# Patient Record
Sex: Female | Born: 1960 | Race: White | Hispanic: No | Smoking: Never smoker
Health system: Southern US, Community
[De-identification: ages and names within clinical notes are randomized; demographics above are authoritative.]

## PROBLEM LIST (undated history)

## (undated) DIAGNOSIS — G459 Transient cerebral ischemic attack, unspecified: Secondary | ICD-10-CM

## (undated) DIAGNOSIS — Z8619 Personal history of other infectious and parasitic diseases: Secondary | ICD-10-CM

## (undated) DIAGNOSIS — N816 Rectocele: Secondary | ICD-10-CM

## (undated) DIAGNOSIS — M85852 Other specified disorders of bone density and structure, left thigh: Secondary | ICD-10-CM

## (undated) DIAGNOSIS — Z9884 Bariatric surgery status: Secondary | ICD-10-CM

## (undated) DIAGNOSIS — G43109 Migraine with aura, not intractable, without status migrainosus: Secondary | ICD-10-CM

## (undated) DIAGNOSIS — K909 Intestinal malabsorption, unspecified: Secondary | ICD-10-CM

## (undated) DIAGNOSIS — J45909 Unspecified asthma, uncomplicated: Secondary | ICD-10-CM

## (undated) DIAGNOSIS — K219 Gastro-esophageal reflux disease without esophagitis: Secondary | ICD-10-CM

## (undated) DIAGNOSIS — M8588 Other specified disorders of bone density and structure, other site: Secondary | ICD-10-CM

## (undated) DIAGNOSIS — N3946 Mixed incontinence: Secondary | ICD-10-CM

## (undated) DIAGNOSIS — G8929 Other chronic pain: Secondary | ICD-10-CM

## (undated) DIAGNOSIS — F32A Depression, unspecified: Secondary | ICD-10-CM

## (undated) DIAGNOSIS — G43909 Migraine, unspecified, not intractable, without status migrainosus: Secondary | ICD-10-CM

## (undated) DIAGNOSIS — R768 Other specified abnormal immunological findings in serum: Secondary | ICD-10-CM

## (undated) DIAGNOSIS — J449 Chronic obstructive pulmonary disease, unspecified: Secondary | ICD-10-CM

## (undated) DIAGNOSIS — E538 Deficiency of other specified B group vitamins: Secondary | ICD-10-CM

## (undated) HISTORY — PX: ABDOMINAL HYSTERECTOMY: SHX81

## (undated) HISTORY — PX: REPAIR RECTOCELE: SUR1206

## (undated) HISTORY — PX: INCONTINENCE SURGERY: SHX676

## (undated) HISTORY — PX: DIAGNOSTIC LAPAROSCOPY: SUR761

## (undated) HISTORY — PX: GASTRIC BYPASS: SHX52

## (undated) HISTORY — PX: CHOLECYSTECTOMY: SHX55

---

## 2011-02-12 ENCOUNTER — Ambulatory Visit: Payer: Self-pay | Admitting: Family Medicine

## 2012-10-05 ENCOUNTER — Encounter: Payer: Self-pay | Admitting: Family Medicine

## 2012-10-23 ENCOUNTER — Encounter: Payer: Self-pay | Admitting: Family Medicine

## 2013-02-04 ENCOUNTER — Ambulatory Visit: Payer: Self-pay | Admitting: Family Medicine

## 2013-03-05 ENCOUNTER — Ambulatory Visit: Payer: Self-pay | Admitting: Family Medicine

## 2013-09-28 ENCOUNTER — Ambulatory Visit: Payer: Self-pay

## 2013-12-02 ENCOUNTER — Emergency Department: Payer: Self-pay | Admitting: Emergency Medicine

## 2014-10-06 ENCOUNTER — Emergency Department
Admission: EM | Admit: 2014-10-06 | Discharge: 2014-10-06 | Disposition: A | Discharge status: Home / Self Care | Payer: Medicare Other | Attending: Emergency Medicine | Admitting: Emergency Medicine

## 2014-10-06 ENCOUNTER — Emergency Department: Payer: Medicare Other

## 2014-10-06 ENCOUNTER — Other Ambulatory Visit: Payer: Self-pay

## 2014-10-06 ENCOUNTER — Encounter: Payer: Self-pay | Admitting: Emergency Medicine

## 2014-10-06 DIAGNOSIS — M79642 Pain in left hand: Secondary | ICD-10-CM | POA: Insufficient documentation

## 2014-10-06 DIAGNOSIS — R42 Dizziness and giddiness: Secondary | ICD-10-CM | POA: Diagnosis not present

## 2014-10-06 HISTORY — DX: Migraine, unspecified, not intractable, without status migrainosus: G43.909

## 2014-10-06 HISTORY — DX: Unspecified asthma, uncomplicated: J45.909

## 2014-10-06 LAB — CBC
HEMATOCRIT: 43.6 % (ref 35.0–47.0)
Hemoglobin: 14.4 g/dL (ref 12.0–16.0)
MCH: 30.2 pg (ref 26.0–34.0)
MCHC: 33 g/dL (ref 32.0–36.0)
MCV: 91.4 fL (ref 80.0–100.0)
Platelets: 220 10*3/uL (ref 150–440)
RBC: 4.77 MIL/uL (ref 3.80–5.20)
RDW: 12.7 % (ref 11.5–14.5)
WBC: 8.8 10*3/uL (ref 3.6–11.0)

## 2014-10-06 LAB — COMPREHENSIVE METABOLIC PANEL
ALBUMIN: 4.4 g/dL (ref 3.5–5.0)
ALK PHOS: 94 U/L (ref 38–126)
ALT: 14 U/L (ref 14–54)
AST: 23 U/L (ref 15–41)
Anion gap: 6 (ref 5–15)
BUN: 14 mg/dL (ref 6–20)
CALCIUM: 9.5 mg/dL (ref 8.9–10.3)
CO2: 29 mmol/L (ref 22–32)
Chloride: 104 mmol/L (ref 101–111)
Creatinine, Ser: 0.76 mg/dL (ref 0.44–1.00)
GFR calc non Af Amer: >60 mL/min (ref >60)
Glucose, Bld: 74 mg/dL (ref 65–99)
Potassium: 3.8 mmol/L (ref 3.5–5.1)
Sodium: 139 mmol/L (ref 135–145)
Total Bilirubin: 0.4 mg/dL (ref 0.3–1.2)
Total Protein: 7.2 g/dL (ref 6.5–8.1)

## 2014-10-06 LAB — TROPONIN I: Troponin I: <0.03 ng/mL (ref <0.031)

## 2014-10-06 MED ORDER — SODIUM CHLORIDE 0.9 % IV SOLN
1000.0000 mL | Freq: Once | INTRAVENOUS | Status: AC
Start: 2014-10-06 — End: 2014-10-06
  Administered 2014-10-06: 1000 mL via INTRAVENOUS

## 2014-10-06 NOTE — ED Notes (Signed)
Pt discharged home after verbalizing understanding of discharge instructions; nad noted. 

## 2014-10-06 NOTE — ED Provider Notes (Signed)
[facility] Emergency Department Provider Note  ____________________________________________  Time seen: On arrival  I have reviewed the triage vital signs and the nursing notes.   HISTORY  Chief Complaint Dizziness and Nausea    HPI Sandra Chung is a 54 y.o. female patient reports she was in her car driving and developed dizziness and nausea and felt shaky like she might pass out. She pulled over and it felt better relatively quickly and decided to come to the emergency department to get checked out. She denies chest pain. She denies short of breath. No focal deficits, no headache. She feels well now. She also complains that she injured her left finger proximal one week ago and would like an x-ray for this.     Past Medical History  Diagnosis Date  . Asthma   . Migraines     There are no active problems to display for this patient.   Past Surgical History  Procedure Laterality Date  . Gastric bypass    . Abdominal hysterectomy      total  . Cholecystectomy      No current outpatient prescriptions on file.  Allergies Levaquin  No family history on file.  Social History History  Substance Use Topics  . Smoking status: Never Smoker   . Smokeless tobacco: Not on file  . Alcohol Use: No     Comment: rarely    Review of Systems  Constitutional: Negative for fever. Eyes: Negative for visual changes. ENT: Negative for sore throat Cardiovascular: Negative for chest pain. Respiratory: Negative for shortness of breath. Gastrointestinal: Negative for abdominal pain, vomiting and diarrhea. Genitourinary: Negative for dysuria. Musculoskeletal: Negative for back pain. Positive left hand pain Skin: Negative for rash. Neurological: Negative for headaches or focal weakness Psychiatric: Mild anxiety  10-point ROS otherwise negative.  ____________________________________________   PHYSICAL EXAM:  VITAL SIGNS: ED Triage Vitals  Enc  Vitals Group     BP 10/06/14 1245 124/100 mmHg     Pulse Rate 10/06/14 1245 77     Resp 10/06/14 1245 16     Temp 10/06/14 1245 98.4 F (36.9 C)     Temp Source 10/06/14 1245 Oral     SpO2 10/06/14 1245 98 %     Weight 10/06/14 1245 130 lb (58.968 kg)     Height 10/06/14 1245  (1.651 m)     Head Cir --      Peak Flow --      Pain Score 10/06/14 1245 4     Pain Loc --      Pain Edu? --      Excl. in GC? --      Constitutional: Alert and oriented. Well appearing and in no distress. Eyes: Conjunctivae are normal.  ENT   Head: Normocephalic and atraumatic.   Mouth/Throat: Mucous membranes are moist. Cardiovascular: Normal rate, regular rhythm. Normal and symmetric distal pulses are present in all extremities. No murmurs, rubs, or gallops. Respiratory: Normal respiratory effort without tachypnea nor retractions. Breath sounds are clear and equal bilaterally.  Gastrointestinal: Soft and non-tender in all quadrants. No distention. There is no CVA tenderness. Genitourinary: deferred Musculoskeletal: Patient with mild bruises to bilateral anterior mid tibias, no bony abnormalities. 2+ distal pulses. Patient with left fifth finger mild tenderness to palpation at IP joint. Full range of motion Neurologic:  Normal speech and language. No gross focal neurologic deficits are appreciated. Skin:  Skin is warm, dry and intact. No rash noted. Psychiatric: Mood and  affect are normal. Patient exhibits appropriate insight and judgment.  ____________________________________________    LABS (pertinent positives/negatives)  Labs Reviewed  COMPREHENSIVE METABOLIC PANEL  CBC  TROPONIN I  URINALYSIS COMPLETEWITH MICROSCOPIC ([facility] ONLY)    ____________________________________________   EKG  ED ECG REPORT I, Jene EveryKINNER, Leslyn Monda, the attending physician, personally viewed and interpreted this ECG.   Date: 10/06/2014  EKG Time: 12:45 PM  Rate: 82  Rhythm: normal sinus rhythm  Axis:  Normal  Intervals:none  ST&T Change: Nonspecific   ____________________________________________    RADIOLOGY I have personally reviewed any xrays that were ordered on this patient: Hand x-ray normal  ____________________________________________   PROCEDURES  Procedure(s) performed: none  Critical Care performed: none  ____________________________________________   INITIAL IMPRESSION / ASSESSMENT AND PLAN / ED COURSE  Pertinent labs & imaging results that were available during my care of the patient were reviewed by me and considered in my medical decision making (see chart for details).  Patient well-appearing, no acute distress. No chest pain, EKG unremarkable. We will check labs and give 1 L normal saline and reevaluate.   ----------------------------------------- 3:18 PM on 10/06/2014 -----------------------------------------  Patient well-appearing. Labs unremarkable. EKG normal. Feels well. All vitals stable. Okay for discharge with follow-up with PCP ____________________________________________   FINAL CLINICAL IMPRESSION(S) / ED DIAGNOSES  Final diagnoses:  Dizziness     Jene Everyobert Tenleigh Byer, MD 10/06/14 [phone]

## 2014-10-06 NOTE — ED Notes (Signed)
Pt comes into the ED c/o dizziness, nausea, shaking, and diaphoresis.  Explains that she was driving to work and had to pull over due to the symptoms.  Denies shortness of breath and chest pain.  Patient states she has had mild visual changes when it comes to "focusing in on things".

## 2014-10-06 NOTE — Discharge Instructions (Signed)

## 2015-07-27 ENCOUNTER — Other Ambulatory Visit: Payer: Self-pay | Admitting: Family Medicine

## 2015-07-27 ENCOUNTER — Ambulatory Visit
Admission: RE | Admit: 2015-07-27 | Discharge: 2015-07-27 | Disposition: A | Discharge status: Home / Self Care | Payer: Self-pay | Source: Ambulatory Visit | Attending: Family Medicine | Admitting: Family Medicine

## 2015-07-27 DIAGNOSIS — M25532 Pain in left wrist: Secondary | ICD-10-CM | POA: Insufficient documentation

## 2015-11-16 ENCOUNTER — Other Ambulatory Visit: Payer: Self-pay | Admitting: Family Medicine

## 2015-11-16 DIAGNOSIS — Z1231 Encounter for screening mammogram for malignant neoplasm of breast: Secondary | ICD-10-CM

## 2015-12-06 ENCOUNTER — Ambulatory Visit: Payer: Medicare Other | Attending: Family Medicine

## 2016-09-11 ENCOUNTER — Ambulatory Visit
Admission: RE | Admit: 2016-09-11 | Discharge: 2016-09-11 | Disposition: A | Discharge status: Home / Self Care | Payer: Medicare Other | Source: Ambulatory Visit | Attending: Family Medicine | Admitting: Family Medicine

## 2016-09-11 ENCOUNTER — Other Ambulatory Visit: Payer: Self-pay | Admitting: Family Medicine

## 2016-09-11 DIAGNOSIS — M7989 Other specified soft tissue disorders: Secondary | ICD-10-CM

## 2017-01-13 ENCOUNTER — Other Ambulatory Visit: Payer: Self-pay | Admitting: Family Medicine

## 2017-01-13 DIAGNOSIS — Z1231 Encounter for screening mammogram for malignant neoplasm of breast: Secondary | ICD-10-CM

## 2017-01-28 ENCOUNTER — Ambulatory Visit
Admission: RE | Admit: 2017-01-28 | Discharge: 2017-01-28 | Disposition: A | Discharge status: Home / Self Care | Payer: Medicare Other | Source: Ambulatory Visit | Attending: Family Medicine | Admitting: Family Medicine

## 2017-01-28 DIAGNOSIS — Z1231 Encounter for screening mammogram for malignant neoplasm of breast: Secondary | ICD-10-CM | POA: Diagnosis not present

## 2019-01-15 ENCOUNTER — Other Ambulatory Visit: Payer: Self-pay | Admitting: Acute Care

## 2019-01-15 DIAGNOSIS — I639 Cerebral infarction, unspecified: Secondary | ICD-10-CM

## 2019-01-28 ENCOUNTER — Ambulatory Visit: Payer: Medicare Other

## 2019-06-21 ENCOUNTER — Other Ambulatory Visit: Payer: Self-pay

## 2019-06-21 ENCOUNTER — Ambulatory Visit: Payer: Medicare Other | Attending: Internal Medicine

## 2019-06-21 DIAGNOSIS — Z23 Encounter for immunization: Secondary | ICD-10-CM

## 2019-06-21 NOTE — Progress Notes (Signed)
   Covid-19 Vaccination Clinic  Name:  Sandra Chung    MRN: [id number] DOB: 12/02/1960  06/21/2019  Sandra Chung was observed post Covid-19 immunization for 15 minutes without incident. She was provided with Vaccine Information Sheet and instruction to access the V-Safe system.   Sandra Chung was instructed to call 911 with any severe reactions post vaccine: Marland Kitchen Difficulty breathing  . Swelling of face and throat  . A fast heartbeat  . A bad rash all over body  . Dizziness and weakness   Immunizations Administered    Name Date Dose VIS Date Route   Pfizer COVID-19 Vaccine 06/21/2019  2:16 PM 0.3 mL 03/05/2019 Intramuscular   Manufacturer: [employer], [employer]   Lot: [id number]   NDC: [address]-3

## 2019-07-14 ENCOUNTER — Ambulatory Visit: Payer: Medicare Other | Attending: Internal Medicine

## 2019-07-14 DIAGNOSIS — Z23 Encounter for immunization: Secondary | ICD-10-CM

## 2019-07-14 NOTE — Progress Notes (Signed)
   Covid-19 Vaccination Clinic  Name:  Sandra Chung    MRN: [id number] DOB: 07/31/1960  07/14/2019  [name] was observed post Covid-19 immunization for 30 minutes based on pre-vaccination screening without incident. She was provided with Vaccine Information Sheet and instruction to access the V-Safe system.   [name] was instructed to call 911 with any severe reactions post vaccine: Marland Kitchen Difficulty breathing  . Swelling of face and throat  . A fast heartbeat  . A bad rash all over body  . Dizziness and weakness   Immunizations Administered    Name Date Dose VIS Date Route   Pfizer COVID-19 Vaccine 07/14/2019  9:45 AM 0.3 mL 05/19/2018 Intramuscular   Manufacturer: [employer], [employer]   Lot: [id number]   NDC: [address]-1

## 2019-10-07 ENCOUNTER — Emergency Department
Admission: EM | Admit: 2019-10-07 | Discharge: 2019-10-07 | Disposition: A | Discharge status: Home / Self Care | Payer: Medicare Other | Attending: Emergency Medicine | Admitting: Emergency Medicine

## 2019-10-07 ENCOUNTER — Emergency Department: Payer: Medicare Other

## 2019-10-07 ENCOUNTER — Other Ambulatory Visit: Payer: Self-pay

## 2019-10-07 DIAGNOSIS — R519 Headache, unspecified: Secondary | ICD-10-CM | POA: Diagnosis present

## 2019-10-07 DIAGNOSIS — G43009 Migraine without aura, not intractable, without status migrainosus: Secondary | ICD-10-CM | POA: Diagnosis not present

## 2019-10-07 DIAGNOSIS — J45909 Unspecified asthma, uncomplicated: Secondary | ICD-10-CM | POA: Insufficient documentation

## 2019-10-07 NOTE — ED Provider Notes (Signed)
[facility] Emergency Department Provider Note ____________________________________________  Time seen: Approximately 11:03 AM  I have reviewed the triage vital signs and the nursing notes.   HISTORY  Chief Complaint Headache   HPI Sandra Chung is a 59 y.o. female with a history of TIA and migraine who presents to the emergency department for evaluation of headache. Around 2pm yesterday, she experienced a sudden onset of headache on the right parietal area. She had intense pain as if she had been struck with a hammer. This pain lasted about 5 minutes and then settled into a dull headache with occasional sharp pains which has continued. No relief with tylenol.    Past Medical History:  Diagnosis Date  . Asthma   . Migraines     There are no problems to display for this patient.   Past Surgical History:  Procedure Laterality Date  . ABDOMINAL HYSTERECTOMY     total  . CHOLECYSTECTOMY    . GASTRIC BYPASS      Prior to Admission medications   Not on File    Allergies Levaquin [levofloxacin in d5w]  Family History  Problem Relation Age of Onset  . Breast cancer Maternal Aunt 27    Social History Social History   Tobacco Use  . Smoking status: Never Smoker  . Smokeless tobacco: Never Used  Substance Use Topics  . Alcohol use: No    Comment: rarely  . Drug use: No    Review of Systems Constitutional: No fever/chills or recent injury. Eyes: No visual changes. ENT: No sore throat. Respiratory: Denies shortness of breath. Gastrointestinal: No abdominal pain.  No nausea, no vomiting.  No diarrhea.  No constipation. Musculoskeletal: Negative for pain. Skin: Negative for rash. Neurological:Positive for headache, negative for focal weakness or numbness. No confusion or fainting. ___________________________________________   PHYSICAL EXAM:  VITAL SIGNS: ED Triage Vitals  Enc Vitals Group     BP 10/07/19 1008 140/84     Pulse Rate  10/07/19 1008 76     Resp 10/07/19 1008 18     Temp 10/07/19 1008 98.2 F (36.8 C)     Temp Source 10/07/19 1008 Oral     SpO2 10/07/19 1008 99 %     Weight 10/07/19 1009 155 lb (70.3 kg)     Height 10/07/19 1009 5\' 1"  (1.549 m)     Head Circumference --      Peak Flow --      Pain Score 10/07/19 1008 5     Pain Loc --      Pain Edu? --      Excl. in GC? --     Constitutional: Alert and oriented. Well appearing and in no acute distress. Eyes: Conjunctivae are normal. PERRL. EOMI without expressed pain. No evidence of papilledema on limited exam. Head: Atraumatic. Nose: No congestion/rhinnorhea. Mouth/Throat: Mucous membranes are moist.  Oropharynx non-erythematous. Neck: No stridor. Supple, no meningismus.  Cardiovascular: Normal rate, regular rhythm. Grossly normal heart sounds.  Good peripheral circulation. Respiratory: Normal respiratory effort.  No retractions. Lungs CTAB. Gastrointestinal: Soft and nontender. No distention.  Musculoskeletal: No lower extremity tenderness nor edema.  No joint effusions. Neurologic:  Normal speech and language. No gross focal neurologic deficits are appreciated. No gait instability. Cranial nerves: 2-10 normal as tested. Cerebellar:Normal Romberg, finger-nose-finger, heel to shin, normal gait. Sensorimotor: No aphasia, pronator drift, clonus, sensory loss or abnormal reflexes.  Skin:  Skin is warm, dry and intact. No rash noted. Psychiatric: Mood and affect  are normal. Speech and behavior are normal. Normal thought process and cognition.  ____________________________________________   LABS (all labs ordered are listed, but only abnormal results are displayed)  Labs Reviewed - No data to display ____________________________________________  EKG  Not indicated. ____________________________________________  RADIOLOGY  CT Head Wo Contrast  Result Date: 10/07/2019 CLINICAL DATA:  New headache, history of migraines and TIAs. EXAM: CT HEAD  WITHOUT CONTRAST TECHNIQUE: Contiguous axial images were obtained from the base of the skull through the vertex without intravenous contrast. COMPARISON:  12/02/2013. FINDINGS: Brain: No evidence of acute infarction, hemorrhage, hydrocephalus, extra-axial collection or mass lesion/mass effect. Vascular: Small punctate calcification in the distribution of the left middle cerebral artery is identified, image 6/2. This is unchanged when compared with previous exam. No hyperdense vessel or unexpected calcification identified. Skull: Normal. Negative for fracture or focal lesion. Sinuses/Orbits: Partial opacification of the right posterior mastoid air cells. Left mastoid air cells and paranasal sinuses are clear. Other: None. IMPRESSION: 1. No acute intracranial abnormalities. 2. Partial opacification of the right posterior mastoid air cells. Electronically Signed   By: Signa Kell M.D.   On: 10/07/2019 11:34   MR ANGIO HEAD WO CONTRAST  Result Date: 10/07/2019 CLINICAL DATA:  Headache EXAM: MRI HEAD WITHOUT CONTRAST MRA HEAD WITHOUT CONTRAST TECHNIQUE: Multiplanar, multiecho pulse sequences of the brain and surrounding structures were obtained without intravenous contrast. Angiographic images of the head were obtained using MRA technique without contrast. COMPARISON:  None. FINDINGS: MRI HEAD Brain: There is no acute infarction or intracranial hemorrhage. There is no intracranial mass, mass effect, or edema. There is no hydrocephalus or extra-axial fluid collection. Ventricles and sulci are normal in size and configuration. Few scattered foci of T2 hyperintensity in the supratentorial white matter are nonspecific but may reflect minor chronic microvascular ischemic changes. Tiny bilateral chronic cerebellar infarcts. Vascular: Major vessel flow voids at the skull base are preserved. Skull and upper cervical spine: Normal marrow signal is preserved. Sinuses/Orbits: Paranasal sinuses are aerated. Orbits are  unremarkable. Other: Sella is unremarkable. Mild patchy mastoid fluid opacification. MRA HEAD Intracranial internal carotid arteries are patent. Middle and anterior cerebral arteries are patent. Intracranial vertebral arteries, basilar artery, posterior cerebral arteries are patent. There is no significant stenosis or aneurysm. IMPRESSION: No evidence of recent infarction, hemorrhage, or mass. Tiny bilateral chronic cerebellar infarcts. Normal MRA of the head. Electronically Signed   By: Guadlupe Spanish M.D.   On: 10/07/2019 13:56   MR BRAIN WO CONTRAST  Result Date: 10/07/2019 CLINICAL DATA:  Headache EXAM: MRI HEAD WITHOUT CONTRAST MRA HEAD WITHOUT CONTRAST TECHNIQUE: Multiplanar, multiecho pulse sequences of the brain and surrounding structures were obtained without intravenous contrast. Angiographic images of the head were obtained using MRA technique without contrast. COMPARISON:  None. FINDINGS: MRI HEAD Brain: There is no acute infarction or intracranial hemorrhage. There is no intracranial mass, mass effect, or edema. There is no hydrocephalus or extra-axial fluid collection. Ventricles and sulci are normal in size and configuration. Few scattered foci of T2 hyperintensity in the supratentorial white matter are nonspecific but may reflect minor chronic microvascular ischemic changes. Tiny bilateral chronic cerebellar infarcts. Vascular: Major vessel flow voids at the skull base are preserved. Skull and upper cervical spine: Normal marrow signal is preserved. Sinuses/Orbits: Paranasal sinuses are aerated. Orbits are unremarkable. Other: Sella is unremarkable. Mild patchy mastoid fluid opacification. MRA HEAD Intracranial internal carotid arteries are patent. Middle and anterior cerebral arteries are patent. Intracranial vertebral arteries, basilar artery, posterior cerebral arteries are  patent. There is no significant stenosis or aneurysm. IMPRESSION: No evidence of recent infarction, hemorrhage, or mass.  Tiny bilateral chronic cerebellar infarcts. Normal MRA of the head. Electronically Signed   By: Guadlupe Spanish M.D.   On: 10/07/2019 13:56   ____________________________________________   PROCEDURES  Procedure(s) performed:  Procedures  Critical Care performed: None ____________________________________________   INITIAL IMPRESSION / ASSESSMENT AND PLAN / ED COURSE  59 year old female presenting to the emergency department for treatment and evaluation of sudden severe headache yesterday around 2 PM.  See HPI for further details.  She does have a history of TIA as well as migraines.  She has no focal weakness on exam.  Plan will be to get a CT without contrast.  Differential diagnosis includes but is not limited to: Atypical migraine, intracranial pathology, TIA/CVA  CT without contrast was negative.  Do to patient's symptoms, will proceed with MRI/MRA.  Patient agrees to this plan.  MRI/MRA are both negative for acute findings.  Symptoms most likely related to an atypical migraine.  She was strongly encouraged to schedule an appointment with her neurologist at [facility].  She is to return to the emergency department for symptoms of change or worsen or for new concerns if she is unable to schedule appointment.  Pertinent labs & imaging results that were available during my care of the patient were reviewed by me and considered in my medical decision making (see chart for details). ____________________________________________   FINAL CLINICAL IMPRESSION(S) / ED DIAGNOSES  Final diagnoses:  Atypical migraine    ED Discharge Orders    None       Chinita Pester, FNP 10/07/19 1509    Emily Filbert, MD 10/07/19 1517

## 2019-10-07 NOTE — ED Triage Notes (Signed)
Pt states she had a sudden onset top of head pain yesterday and today just having a mild HA, denies dizziness or nausea, no visual changes.

## 2019-10-07 NOTE — ED Notes (Signed)
Pt ambulatory to MRI with [facility], MRI tech.

## 2019-10-07 NOTE — ED Notes (Signed)
Pt talking to MRI on the phone.  

## 2019-11-24 ENCOUNTER — Other Ambulatory Visit: Payer: Self-pay | Admitting: Family Medicine

## 2019-11-24 DIAGNOSIS — K909 Intestinal malabsorption, unspecified: Secondary | ICD-10-CM

## 2019-11-24 DIAGNOSIS — Z780 Asymptomatic menopausal state: Secondary | ICD-10-CM

## 2019-12-20 ENCOUNTER — Other Ambulatory Visit: Payer: Self-pay | Admitting: Family Medicine

## 2019-12-20 DIAGNOSIS — Z1231 Encounter for screening mammogram for malignant neoplasm of breast: Secondary | ICD-10-CM

## 2020-01-12 ENCOUNTER — Ambulatory Visit
Admission: RE | Admit: 2020-01-12 | Discharge: 2020-01-12 | Disposition: A | Discharge status: Home / Self Care | Payer: Medicare Other | Source: Ambulatory Visit | Attending: Family Medicine | Admitting: Family Medicine

## 2020-01-12 ENCOUNTER — Other Ambulatory Visit: Payer: Self-pay

## 2020-01-12 DIAGNOSIS — K909 Intestinal malabsorption, unspecified: Secondary | ICD-10-CM

## 2020-01-12 DIAGNOSIS — Z1231 Encounter for screening mammogram for malignant neoplasm of breast: Secondary | ICD-10-CM | POA: Diagnosis present

## 2020-01-12 DIAGNOSIS — Z780 Asymptomatic menopausal state: Secondary | ICD-10-CM | POA: Insufficient documentation

## 2020-08-01 ENCOUNTER — Other Ambulatory Visit: Payer: Self-pay | Admitting: Family Medicine

## 2020-08-01 DIAGNOSIS — M79605 Pain in left leg: Secondary | ICD-10-CM

## 2020-08-01 DIAGNOSIS — S8012XA Contusion of left lower leg, initial encounter: Secondary | ICD-10-CM

## 2020-08-02 ENCOUNTER — Other Ambulatory Visit: Payer: Self-pay

## 2020-08-02 ENCOUNTER — Ambulatory Visit
Admission: RE | Admit: 2020-08-02 | Discharge: 2020-08-02 | Disposition: A | Discharge status: Home / Self Care | Payer: Medicare Other | Source: Ambulatory Visit | Attending: Family Medicine | Admitting: Family Medicine

## 2020-08-02 DIAGNOSIS — S8012XA Contusion of left lower leg, initial encounter: Secondary | ICD-10-CM | POA: Diagnosis present

## 2020-08-02 DIAGNOSIS — M79605 Pain in left leg: Secondary | ICD-10-CM | POA: Diagnosis present

## 2021-11-17 IMAGING — MR MR MRA HEAD W/O CM
3 series · 23 of 48 positions shown · non-contrast
Comparison: None.

CLINICAL DATA: Headache

EXAM:
MRI HEAD WITHOUT CONTRAST
MRA HEAD WITHOUT CONTRAST
TECHNIQUE: Multiplanar, multiecho pulse sequences of the brain and surrounding
structures were obtained without intravenous contrast. Angiographic
images of the head were obtained using MRA technique without
contrast.

[Series 13: TOF · axial · 0.5mm · 0.41mm/px · z∈[-72,+24]mm · 13 of 205 slices shown]
[im 1/205]
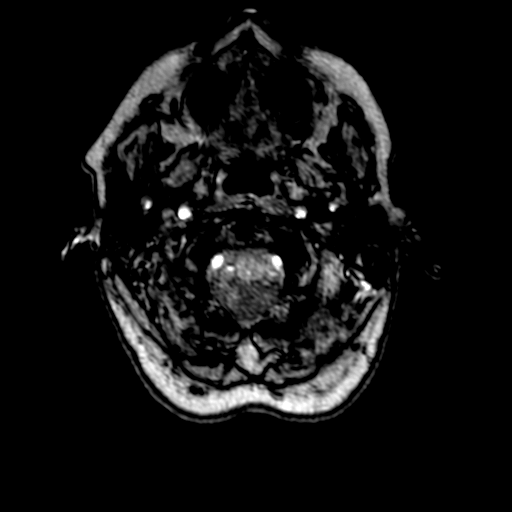
[im 6/205]
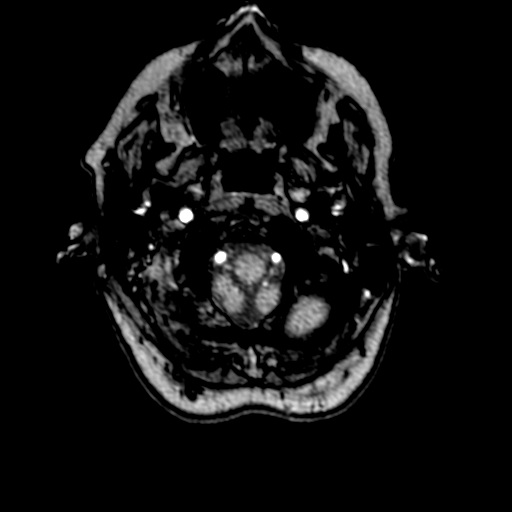
[im 12/205]
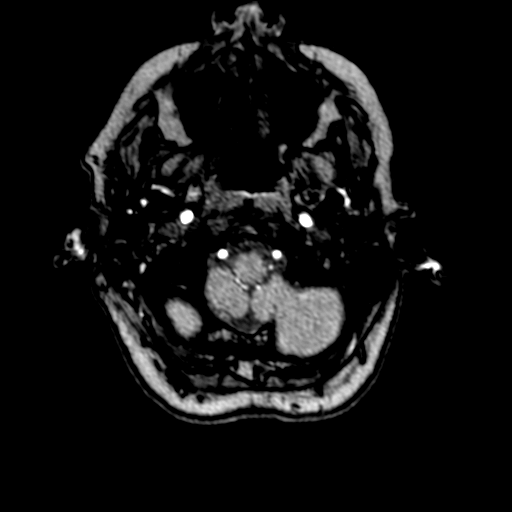
[im 34/205]
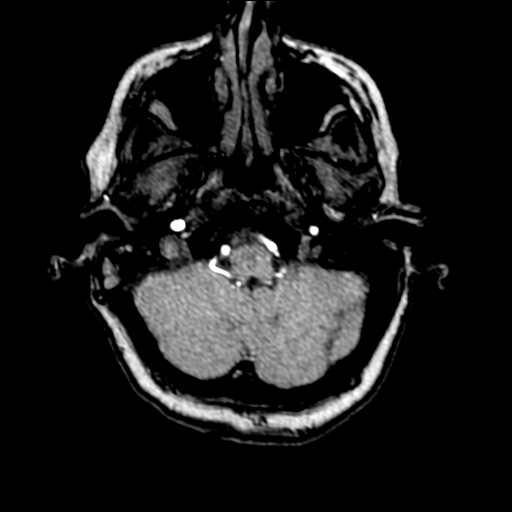
[im 39/205]
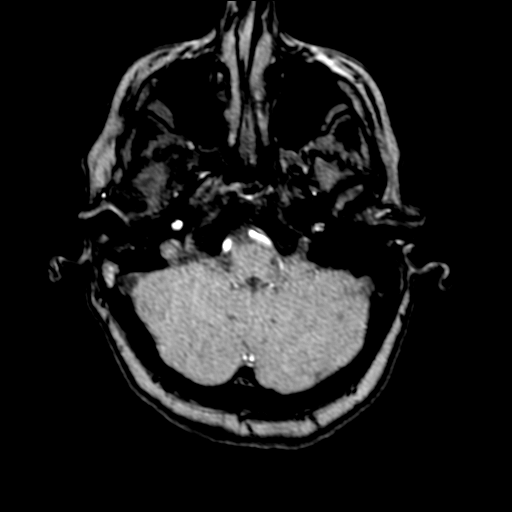
[im 61/205]
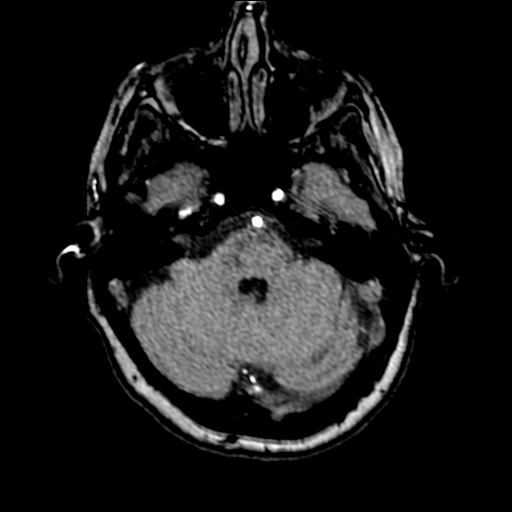
[im 89/205]
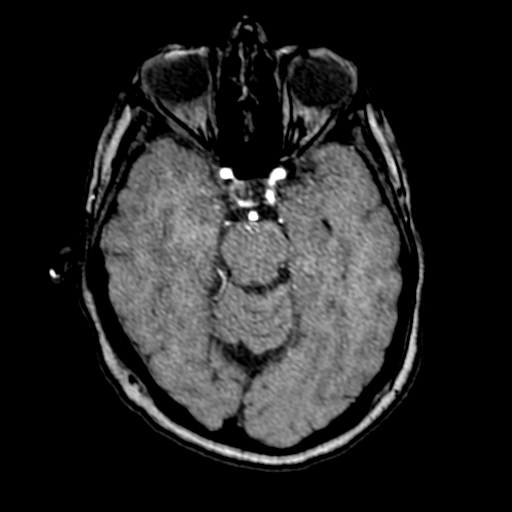
[im 105/205]
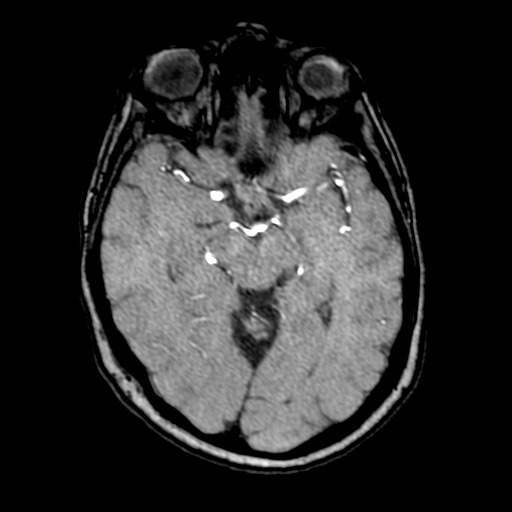
[im 116/205]
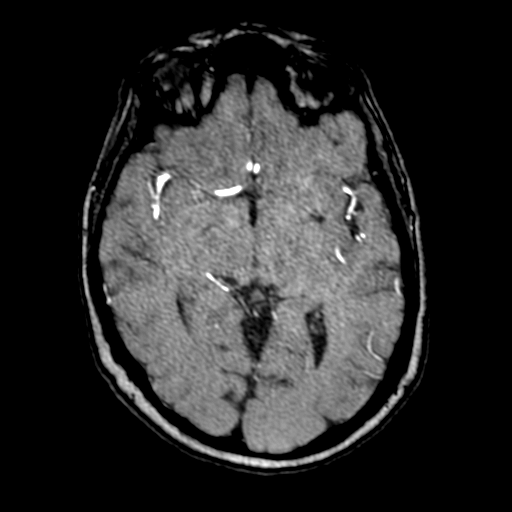
[im 144/205]
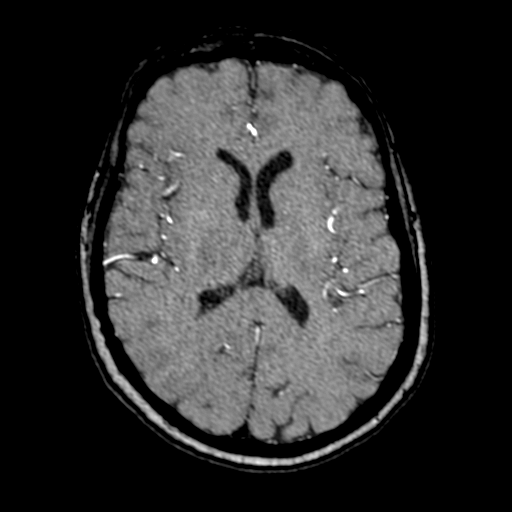
[im 166/205]
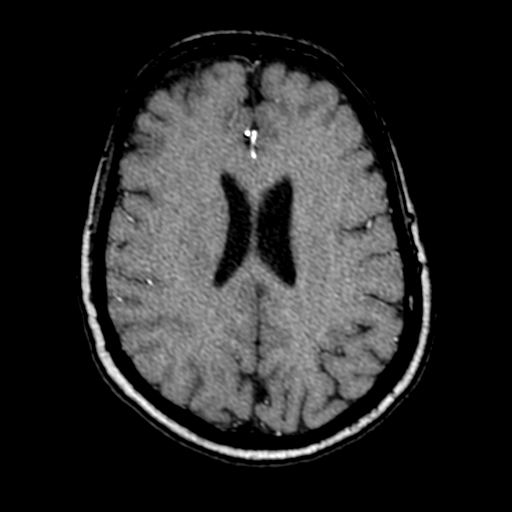
[im 171/205]
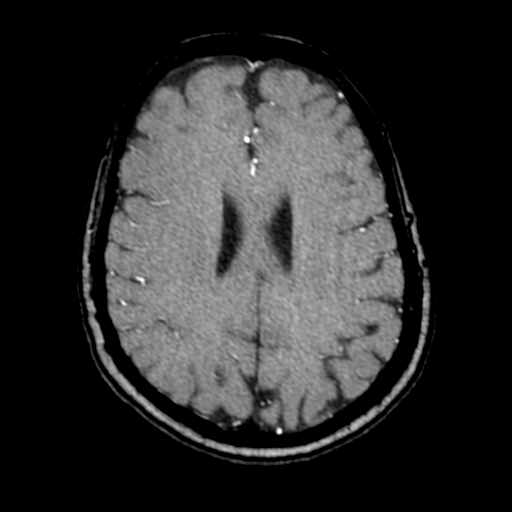
[im 193/205]
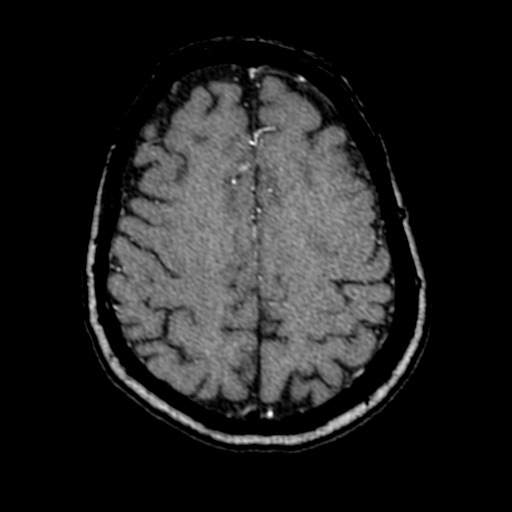

[Series 18: T1 · sagittal · 5.0mm · 0.62mm/px · 5 of 24 slices shown]
[im 1/24]
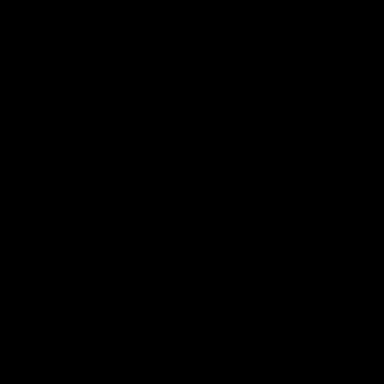
[im 6/24]
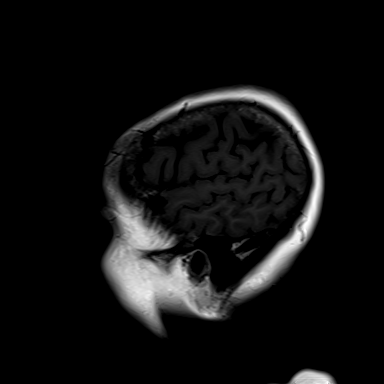
[im 12/24]
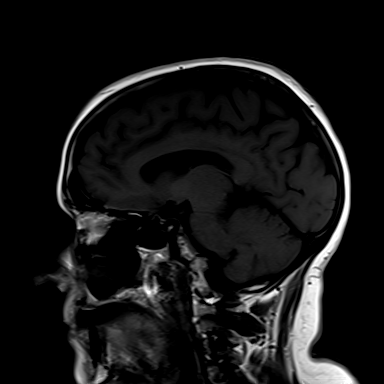
[im 18/24]
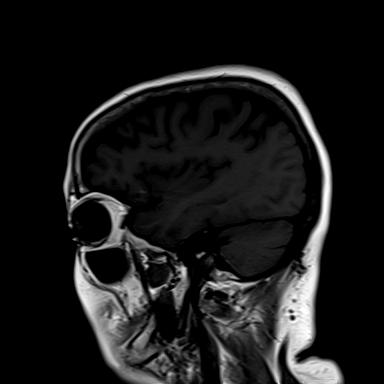
[im 24/24]
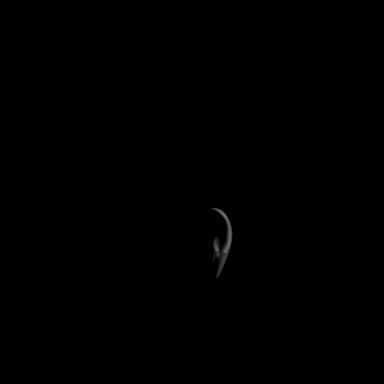

[Series 19: T2 · axial · 5.0mm · 0.53mm/px · z∈[-72,+71]mm · 5 of 25 slices shown]
[im 1/25]
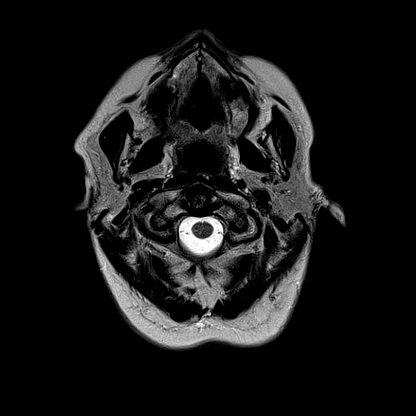
[im 7/25]
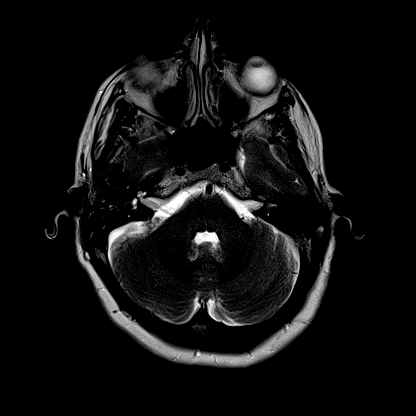
[im 13/25]
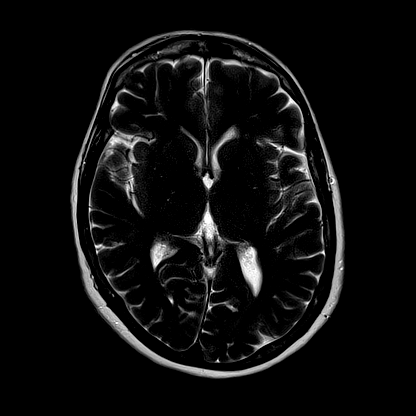
[im 19/25]
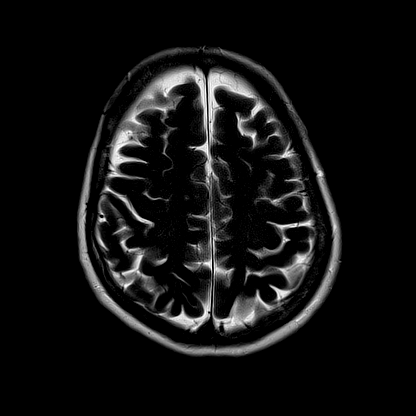
[im 25/25]
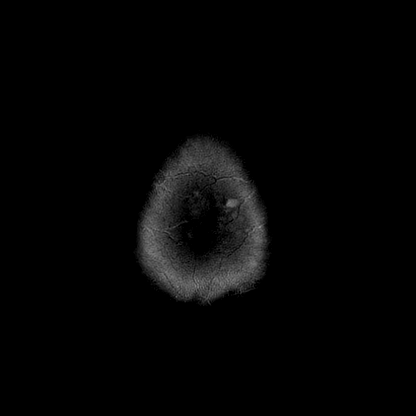

[23 of 48 positions shown; findings below may reference images not displayed]

FINDINGS: MRI HEAD

Brain: There is no acute infarction or intracranial hemorrhage.
There is no intracranial mass, mass effect, or edema. There is no
hydrocephalus or extra-axial fluid collection. Ventricles and sulci
are normal in size and configuration. Few scattered foci of T2
hyperintensity in the supratentorial white matter are nonspecific
but may reflect minor chronic microvascular ischemic changes. Tiny
bilateral chronic cerebellar infarcts.

Vascular: Major vessel flow voids at the skull base are preserved.

Skull and upper cervical spine: Normal marrow signal is preserved.

Sinuses/Orbits: Paranasal sinuses are aerated. Orbits are
unremarkable.

Other: Sella is unremarkable. Mild patchy mastoid fluid
opacification.

MRA HEAD

Intracranial internal carotid arteries are patent. Middle and
anterior cerebral arteries are patent. Intracranial vertebral
arteries, basilar artery, posterior cerebral arteries are patent.
There is no significant stenosis or aneurysm.
IMPRESSION: No evidence of recent infarction, hemorrhage, or mass.

Tiny bilateral chronic cerebellar infarcts.

Normal MRA of the head.

## 2021-12-13 ENCOUNTER — Other Ambulatory Visit: Payer: Self-pay | Admitting: Family Medicine

## 2021-12-13 DIAGNOSIS — R55 Syncope and collapse: Secondary | ICD-10-CM

## 2021-12-13 DIAGNOSIS — R4789 Other speech disturbances: Secondary | ICD-10-CM

## 2022-09-13 IMAGING — US US EXTREM LOW VENOUS*L*
1 series · 13 of 24 positions shown · non-contrast
Comparison: Left lower extremity venous Doppler
ultrasound-09/11/2016 (negative).

CLINICAL DATA: Left lower extremity pain. Discoloration and
bruising involving the medial aspect of the left knee. Evaluate for
DVT.



[Series 1: us extrem low venous*left* · 0.07mm/px · 13 of 33 slices shown]
[im 1/33]
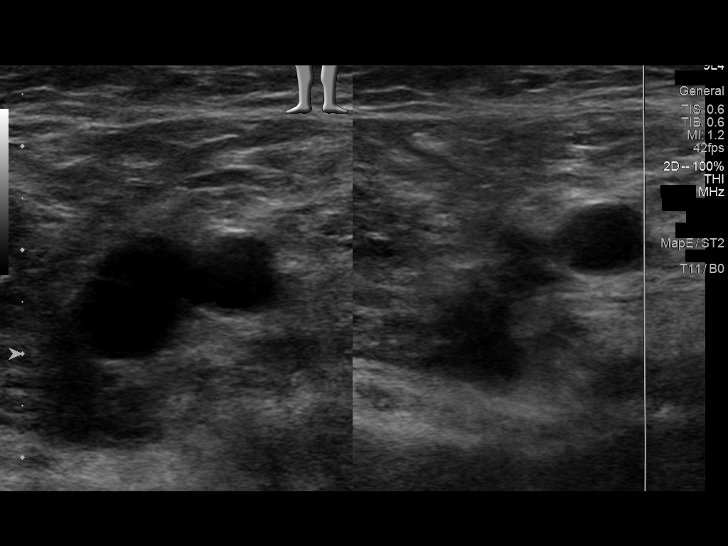
[im 3/33]
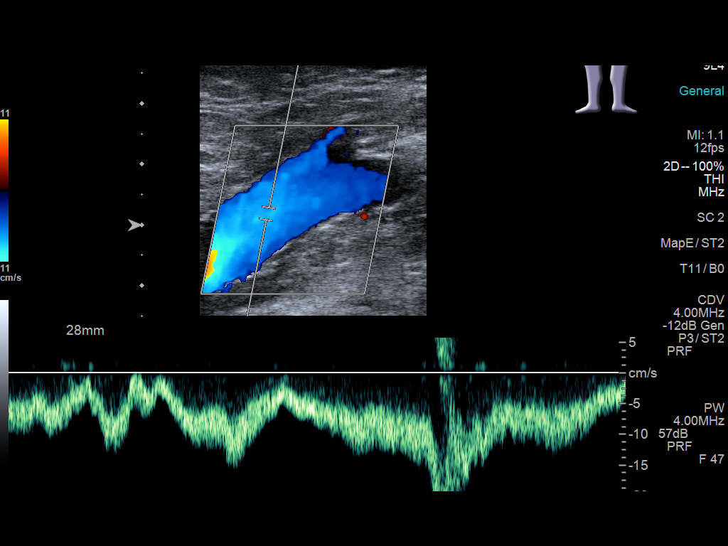
[im 6/33]
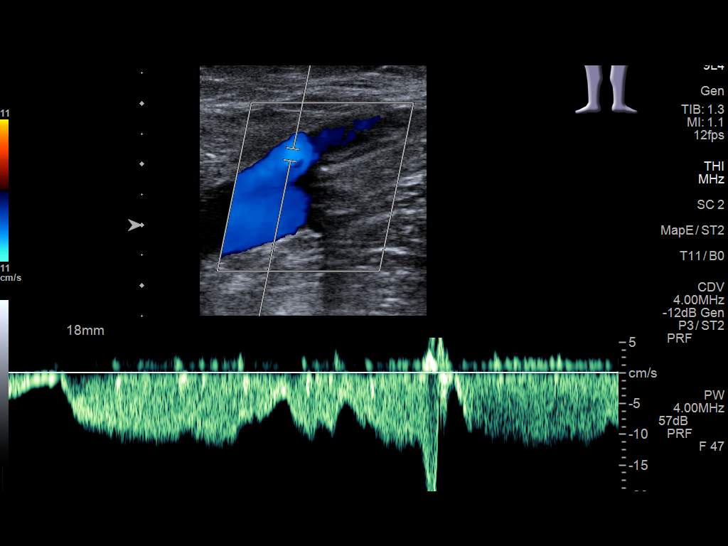
[im 9/33]
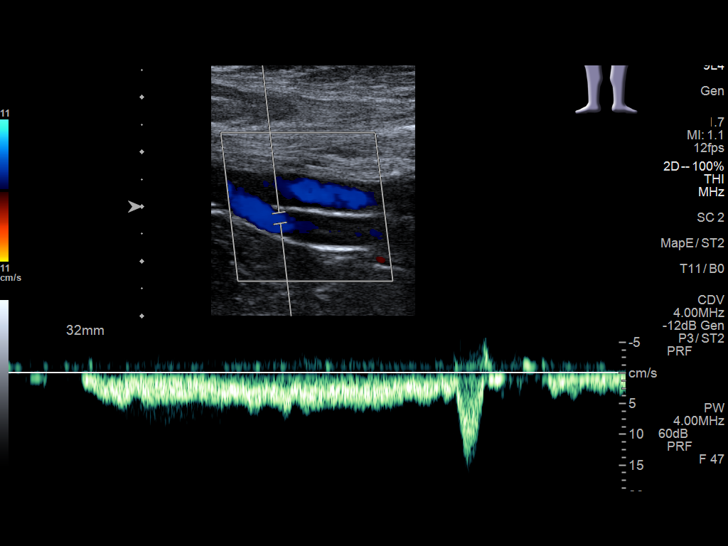
[im 12/33]
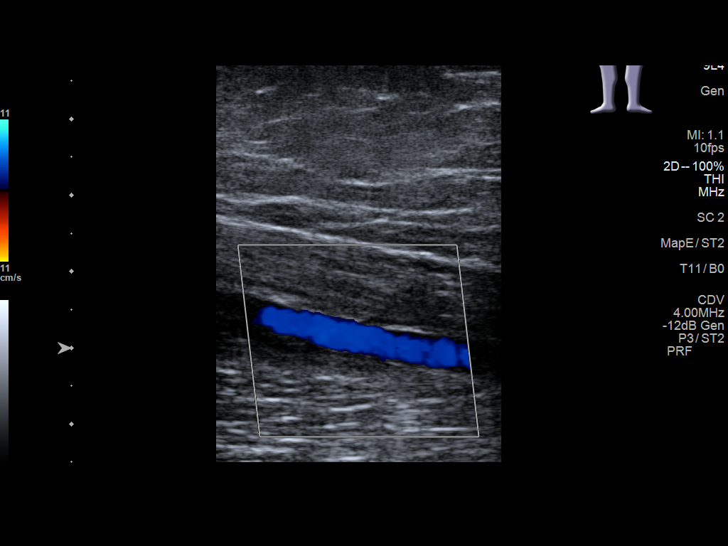
[im 14/33]
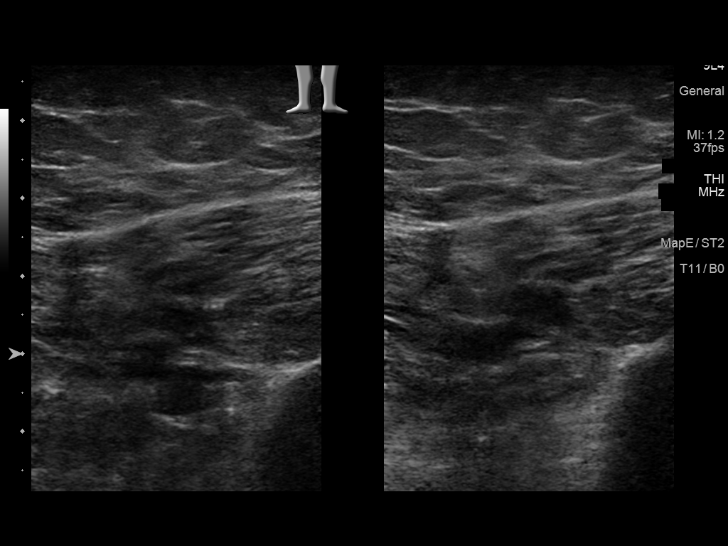
[im 17/33]
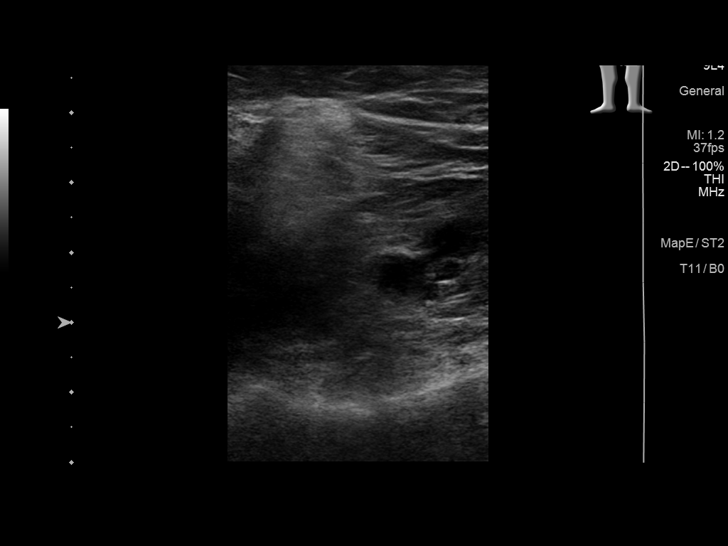
[im 19/33]
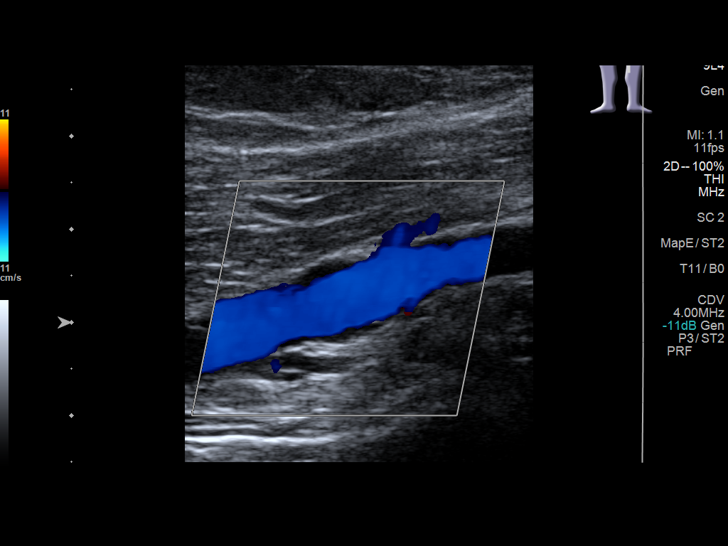
[im 21/33]
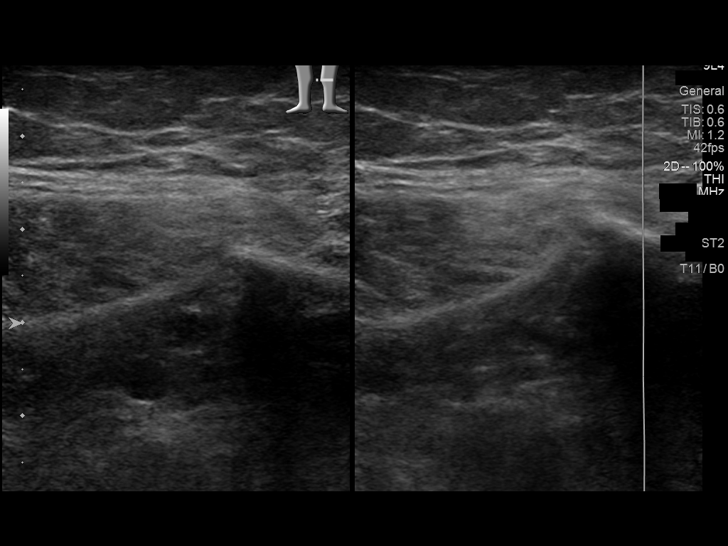
[im 24/33]
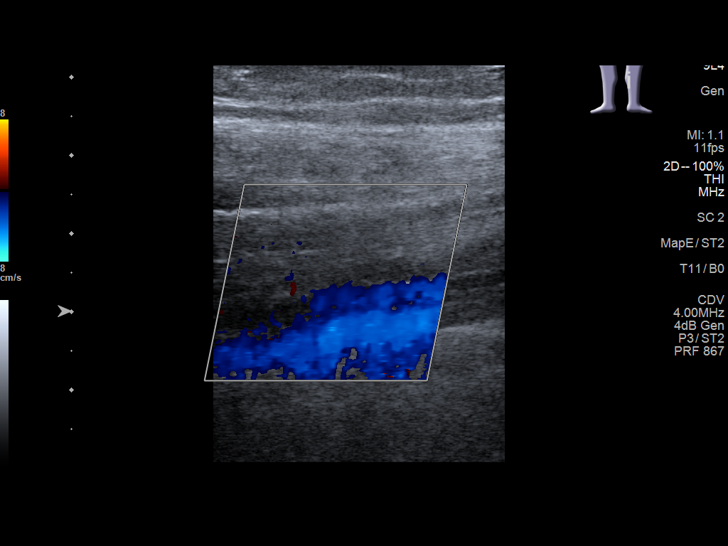
[im 27/33]
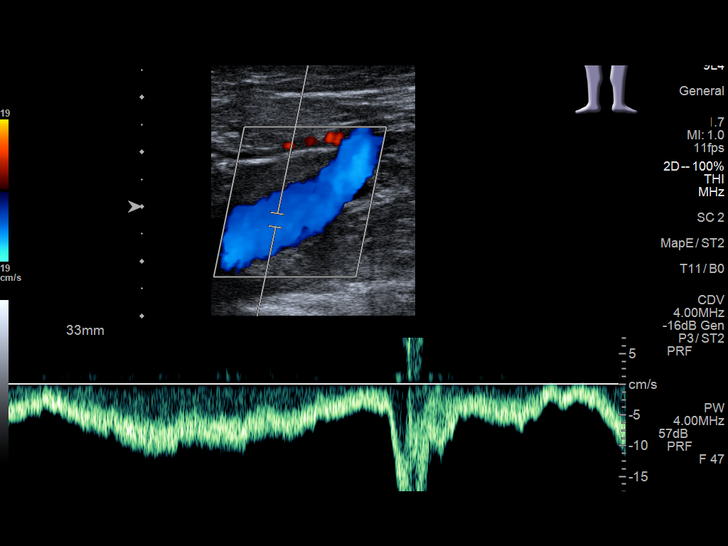
[im 30/33]
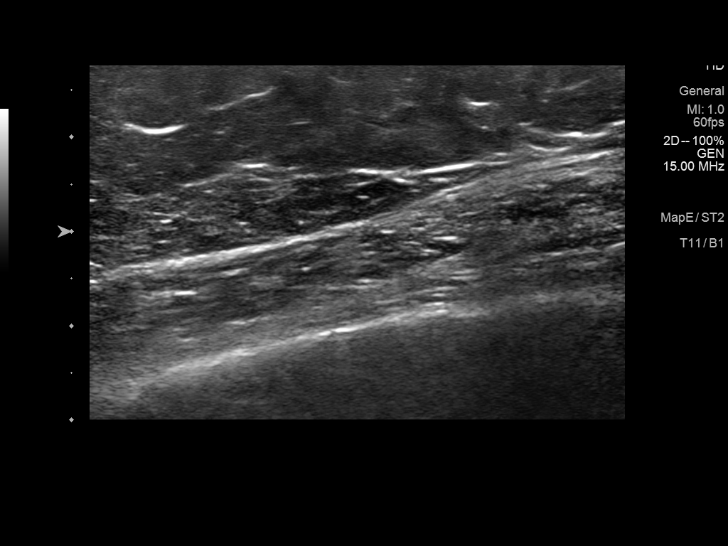
[im 33/33]
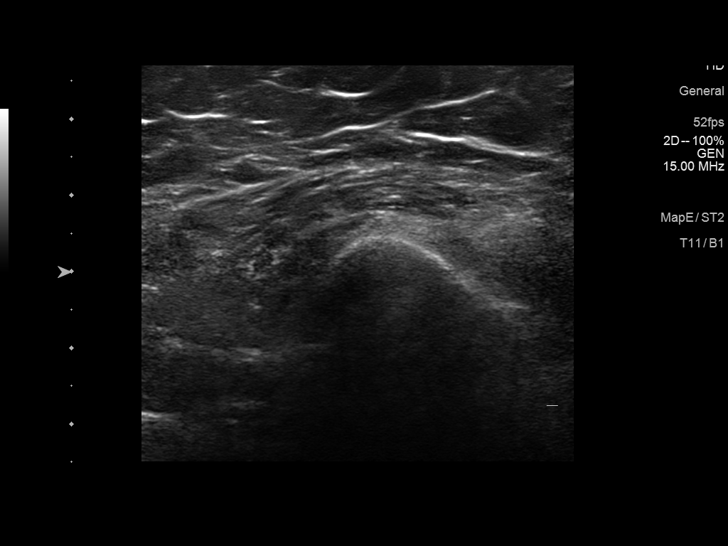

[13 of 24 positions shown; findings below may reference images not displayed]

FINDINGS: Contralateral Common Femoral Vein: Respiratory phasicity is normal
and symmetric with the symptomatic side. No evidence of thrombus.
Normal compressibility.

Common Femoral Vein: No evidence of thrombus. Normal
compressibility, respiratory phasicity and response to augmentation.

Saphenofemoral Junction: No evidence of thrombus. Normal
compressibility and flow on color Doppler imaging.

Profunda Femoral Vein: No evidence of thrombus. Normal
compressibility and flow on color Doppler imaging.

Femoral Vein: No evidence of thrombus. Normal compressibility,
respiratory phasicity and response to augmentation.

Popliteal Vein: No evidence of thrombus. Normal compressibility,
respiratory phasicity and response to augmentation.

Calf Veins: No evidence of thrombus. Normal compressibility and flow
on color Doppler imaging.

Superficial Great Saphenous Vein: No evidence of thrombus. Normal
compressibility.

Venous Reflux:  None.

Other Findings:  None.
IMPRESSION: No evidence of DVT within the left lower extremity.

## 2023-01-22 ENCOUNTER — Other Ambulatory Visit: Payer: Self-pay | Admitting: Family Medicine

## 2023-01-22 DIAGNOSIS — Z1231 Encounter for screening mammogram for malignant neoplasm of breast: Secondary | ICD-10-CM

## 2023-02-06 ENCOUNTER — Ambulatory Visit
Admission: RE | Admit: 2023-02-06 | Discharge: 2023-02-06 | Disposition: A | Discharge status: Home / Self Care | Payer: Medicare Other | Source: Ambulatory Visit | Attending: Family Medicine | Admitting: Family Medicine

## 2023-02-06 DIAGNOSIS — Z1231 Encounter for screening mammogram for malignant neoplasm of breast: Secondary | ICD-10-CM | POA: Insufficient documentation

## 2023-04-02 ENCOUNTER — Other Ambulatory Visit: Payer: Self-pay | Admitting: Family Medicine

## 2023-04-02 DIAGNOSIS — M85852 Other specified disorders of bone density and structure, left thigh: Secondary | ICD-10-CM

## 2023-08-05 ENCOUNTER — Ambulatory Visit
Admission: RE | Admit: 2023-08-05 | Discharge: 2023-08-05 | Disposition: A | Discharge status: Home / Self Care | Source: Ambulatory Visit | Attending: Family Medicine | Admitting: Family Medicine

## 2023-08-05 DIAGNOSIS — M85852 Other specified disorders of bone density and structure, left thigh: Secondary | ICD-10-CM | POA: Insufficient documentation

## 2023-09-25 ENCOUNTER — Encounter: Payer: Self-pay | Admitting: *Deleted

## 2023-10-16 ENCOUNTER — Encounter: Payer: Self-pay | Admitting: Internal Medicine

## 2023-10-16 DIAGNOSIS — G4733 Obstructive sleep apnea (adult) (pediatric): Secondary | ICD-10-CM

## 2023-10-30 ENCOUNTER — Other Ambulatory Visit: Payer: Self-pay

## 2023-10-30 ENCOUNTER — Encounter: Attending: Pulmonary Disease

## 2023-10-30 DIAGNOSIS — J45909 Unspecified asthma, uncomplicated: Secondary | ICD-10-CM

## 2023-10-30 DIAGNOSIS — R0600 Dyspnea, unspecified: Secondary | ICD-10-CM

## 2023-10-30 NOTE — Progress Notes (Signed)
 Virtual Visit completed. Patient informed on EP and RD appointment and 6 Minute walk test. Patient also informed of patient health questionnaires on My Chart. Patient Verbalizes understanding. Visit diagnosis can be found in [facility] 07/11/2023.

## 2023-11-05 ENCOUNTER — Ambulatory Visit

## 2023-11-05 NOTE — Procedures (Signed)
 [facility]  Polysomnogram Report Part I  Phone: [phone] Fax: [phone]  Patient Name: Sandra Chung, Sandra Chung. Acquisition Number: [id number]  Date of Birth: 1960-10-30 Acquisition Date: 10/16/2023  Referring Physician: Jeoffrey Darby, MD     History: The patient is a 63 year old  . Medical History: OSA, Severe persistent asthma w/o complication  Medications: None reported.  Procedure: This routine overnight polysomnogram was performed on the [facility] 5 using the standard CPAP protocol. This included 6 channels of EEG, 2 channels of EOG, chin EMG, bilateral anterior tibialis EMG, nasal/oral thermistor, PTAF (nasal pressure transducer), chest and abdominal wall movements, EKG, and pulse oximetry.  Description: The total recording time was 439.5 minutes. The total sleep time was 388.5 minutes. There were a total of 6.5 minutes of wakefulness after sleep onset for a slightly reducedsleep efficiency of 88.4%. The latency to sleep onset was prolonged at 44.5 minutes. The R sleep onset latency was within normal limits at 65.0 minutes. Sleep parameters, as a percentage of the total sleep time, demonstrated 3.5% of sleep was in N1 sleep, 30.6% N2, 37.5% N3 and 28.4% R sleep. There were a total of 23 arousals for an arousal index of 3.6 arousals per hour of sleep that was normal.  Overall, there were a total of 18 respiratory events for a respiratory disturbance index, which includes apneas, hypopneas and RERAs (increased respiratory effort) of 2.8 respiratory events per hour of sleep during the pressure titration.  was initiated at 4 cm H2O at lights out, 11:04 p.m. It was titrated in 1-2 cm increments for intermittent hypopneas to the final pressure of 8 cm H2O. The apnea was controlled at this pressure and supine, REM sleep was observed.  Additionally, the baseline oxygen saturation during wakefulness was 96%, during NREM sleep averaged 94%, and during REM sleep averaged 94%. The total duration  of oxygen < 90% was 0.4 minutes.  Cardiac monitoring-  demonstrate transient cardiac decelerations associated with the apneas.  significant cardiac rhythm irregularities.   Periodic limb movement monitoring- did not demonstrate periodic limb movements.   Impression: This patient's obstructive sleep apnea demonstrated significant improvement with the utilization of nasal  at 8 cm H2O.      Recommendations: Would recommend utilization of nasal  at 8 cm H2O.      An AIRFIT N20 - MED mask was used. Chin strap used during study- no. Humidifier used during study- yes.     Sandra RONAL Bathe, MD, [facility] Diplomate ABMS-Pulmonary, Critical Care and Sleep Medicine  Electronically reviewed and digitally signed  [facility] CPAP/BIPAP Polysomnogram Report Part II Phone: [phone] Fax: [phone]  Patient last name [facility] Neck Size 14.5 in. Acquisition [id number]  Patient first name Sandra Chung. Weight 167.0 lbs. Started 10/16/2023 at 10:55:17 PM  Birth date 06/20/1960 Height 61.0 in. Stopped 10/17/2023 at 6:29:11 AM  Age 63      Type Adult BMI 31.6 lb/in2 Duration 439.5  Raford Sprang, RPSGT & Sandra Chung   Reviewed by: Sandra MATSU. Henke, PhD, ABSM, FAASM Sleep Data: Lights Out: 11:04:17 PM Sleep Onset: 11:48:47 PM  Lights On: 6:23:47 AM Sleep Efficiency: 88.4 %  Total Recording Time: 439.5 min Sleep Latency (from Lights Off) 44.5 min  Total Sleep Time (TST): 388.5 min R Latency (from Sleep Onset): 65.0 min  Sleep Period Time: 394.5 min Total number of awakenings: 6  Wake during sleep: 6.0 min Wake After Sleep Onset (WASO): 6.5 min   Sleep Data:  Arousal Summary: Stage  Latency from lights out (min) Latency from sleep onset (min) Duration (min) % Total Sleep Time  Normal values  N 1 44.5 0.0 13.5 3.5 (5%)  N 2 50.0 5.5 119.0 30.6 (50%)  N 3 52.5 8.0 145.5 37.5 (20%)  R 109.5 65.0 110.5 28.4 (25%)   Number Index  Spontaneous 3 0.5  Apneas & Hypopneas 1 0.2  RERAs 0  0.0       (Apneas & Hypopneas & RERAs)  (1) (0.2)  Limb Movement 20 3.1  Snore 0 0.0  TOTAL 24 3.7     Respiratory Data:  CA OA MA Apnea Hypopnea* A+ H RERA Total  Number 0 0 0 0 18 18 0 18  Mean Dur (sec) 0.0 0.0 0.0 0.0 16.6 16.6 0.0 16.6  Max Dur (sec) 0.0 0.0 0.0 0.0 38.0 38.0 0.0 38.0  Total Dur (min) 0.0 0.0 0.0 0.0 5.0 5.0 0.0 5.0  % of TST 0.0 0.0 0.0 0.0 1.3 1.3 0.0 1.3  Index (#/h TST) 0.0 0.0 0.0 0.0 2.8 2.8 0.0 2.8  *Hypopneas scored based on 4% or greater desaturation.  Sleep Stage:         REM NREM TST  AHI 8.1 0.6 2.8  RDI 8.1 0.6 2.8   Sleep (min) TST (%) REM (min) NREM (min) CA (#) OA (#) MA (#) HYP (#) AHI (#/h) RERA (#) RDI (#/h) Desat (#)  Supine 292.1 75.19 92.0 200.1 0 0 0 18 3.7 0 3.7 27  Non-Supine 96.40 24.81 18.50 77.90 0.00 0.00 0.00 0.00 0.00 0 0.00 5.00  Right: 96.4 24.81 18.5 77.9 0 0 0 0 0.0 0 0.00 5     Snoring: Total number of snoring episodes  0  Total time with snoring    min (   % of sleep)   Oximetry Distribution:             WK REM NREM TOTAL  Average (%)   96 94 94 94  < 90% 0.1 0.3 0.0 0.4  < 80% 0.0 0.0 0.0 0.0  < 70% 0.0 0.0 0.0 0.0  # of Desaturations* 2 20 10  32  Desat Index (#/hour) 2.4 10.9 2.2 4.9  Desat Max (%) 5 6 9 9   Desat Max Dur (sec) 43.0 113.0 114.0 114.0  Approx Min O2 during sleep 88  Approx min O2 during a respiratory event 88  Was Oxygen added (Y/N) and final rate :    LPM  *Desaturations based on 3% or greater drop from baseline.   Cheyne Stokes Breathing: None Present   Heart Rate Summary:  Average Heart Rate During Sleep 67.5 bpm      Highest Heart Rate During Sleep (95th %) 72.0 bpm      Highest Heart Rate During Sleep 156 bpm (artifact)  Highest Heart Rate During Recording (TIB) 205 bpm (artifact)   Heart Rate Observations: Event Type # Events   Bradycardia 0 Lowest HR Scored: N/A  Sinus Tachycardia During Sleep 0 Highest HR Scored: N/A  Narrow Complex Tachycardia 0 Highest HR  Scored: N/A  Wide Complex Tachycardia 0 Highest HR Scored: N/A  Asystole 0 Longest Pause: N/A  Atrial Fibrillation 0 Duration Longest Event: N/A  Other Arrythmias   Type:   Periodic Limb Movement Data: (Primary legs unless otherwise noted) Total # Limb Movement 30 Limb Movement Index 4.6  Total # PLMS    PLMS Index     Total # PLMS Arousals    PLMS  Arousal Index     Percentage Sleep Time with PLMS   min (   % sleep)  Mean Duration limb movements (secs)       IPAP Level (cmH2O) EPAP Level (cmH2O) Total Duration (min) Sleep Duration (min) Sleep (%) REM (%) CA  #) OA # MA # HYP #) AHI (#/hr) RERAs # RERAs (#/hr) RDI (#/hr)  4 4 202.4 198.4 98.0 15.3 0 0 0 7 2.1 0 0.0 2.1  6 6  23.5 23.5 100.0 100.0 0 0 0 8 20.4 0 0.0 20.4  7 7  81.9 81.9 100.0 17.1 0 0 0 2 1.5 0 0.0 1.5  8 8  85.8 83.8 97.7 48.4 0 0 0 1 0.7 0 0.0 0.7

## 2023-11-06 ENCOUNTER — Encounter: Payer: Self-pay | Admitting: *Deleted

## 2023-11-07 ENCOUNTER — Ambulatory Visit
Admission: RE | Admit: 2023-11-07 | Discharge: 2023-11-07 | Disposition: A | Discharge status: Home / Self Care | Attending: Gastroenterology | Admitting: Gastroenterology

## 2023-11-07 ENCOUNTER — Other Ambulatory Visit: Payer: Self-pay

## 2023-11-07 ENCOUNTER — Encounter
Admission: RE | Disposition: A | Discharge status: Home / Self Care | Payer: Self-pay | Source: Home / Self Care | Attending: Gastroenterology

## 2023-11-07 ENCOUNTER — Ambulatory Visit: Admitting: Anesthesiology

## 2023-11-07 DIAGNOSIS — J4489 Other specified chronic obstructive pulmonary disease: Secondary | ICD-10-CM | POA: Insufficient documentation

## 2023-11-07 DIAGNOSIS — Z860101 Personal history of adenomatous and serrated colon polyps: Secondary | ICD-10-CM | POA: Diagnosis not present

## 2023-11-07 DIAGNOSIS — K640 First degree hemorrhoids: Secondary | ICD-10-CM | POA: Diagnosis not present

## 2023-11-07 DIAGNOSIS — K219 Gastro-esophageal reflux disease without esophagitis: Secondary | ICD-10-CM | POA: Insufficient documentation

## 2023-11-07 DIAGNOSIS — Z8673 Personal history of transient ischemic attack (TIA), and cerebral infarction without residual deficits: Secondary | ICD-10-CM | POA: Diagnosis not present

## 2023-11-07 DIAGNOSIS — Z79899 Other long term (current) drug therapy: Secondary | ICD-10-CM | POA: Insufficient documentation

## 2023-11-07 DIAGNOSIS — R1084 Generalized abdominal pain: Secondary | ICD-10-CM | POA: Diagnosis present

## 2023-11-07 DIAGNOSIS — K5730 Diverticulosis of large intestine without perforation or abscess without bleeding: Secondary | ICD-10-CM | POA: Diagnosis not present

## 2023-11-07 HISTORY — DX: Transient cerebral ischemic attack, unspecified: G45.9

## 2023-11-07 HISTORY — DX: Gastro-esophageal reflux disease without esophagitis: K21.9

## 2023-11-07 HISTORY — DX: Rectocele: N81.6

## 2023-11-07 HISTORY — DX: Chronic obstructive pulmonary disease, unspecified: J44.9

## 2023-11-07 HISTORY — DX: Personal history of other infectious and parasitic diseases: Z86.19

## 2023-11-07 HISTORY — DX: Bariatric surgery status: Z98.84

## 2023-11-07 HISTORY — DX: Other specified disorders of bone density and structure, left thigh: M85.852

## 2023-11-07 HISTORY — DX: Other chronic pain: G89.29

## 2023-11-07 HISTORY — DX: Other specified disorders of bone density and structure, other site: M85.88

## 2023-11-07 HISTORY — DX: Depression, unspecified: F32.A

## 2023-11-07 HISTORY — DX: Deficiency of other specified B group vitamins: E53.8

## 2023-11-07 HISTORY — DX: Other specified abnormal immunological findings in serum: R76.8

## 2023-11-07 HISTORY — DX: Intestinal malabsorption, unspecified: K90.9

## 2023-11-07 HISTORY — PX: COLONOSCOPY: SHX5424

## 2023-11-07 HISTORY — DX: Migraine with aura, not intractable, without status migrainosus: G43.109

## 2023-11-07 HISTORY — DX: Mixed incontinence: N39.46

## 2023-11-07 SURGERY — COLONOSCOPY
Anesthesia: General

## 2023-11-07 MED ORDER — SODIUM CHLORIDE 0.9 % IV SOLN: INTRAVENOUS | Status: DC

## 2023-11-07 MED ORDER — PROPOFOL 10 MG/ML IV BOLUS
INTRAVENOUS | Status: DC | PRN
Start: 2023-11-07 — End: 2023-11-07
  Administered 2023-11-07 (×2): 50 mg via INTRAVENOUS

## 2023-11-07 MED ORDER — PROPOFOL 500 MG/50ML IV EMUL
INTRAVENOUS | Status: DC | PRN
  Administered 2023-11-07: 75 ug/kg/min via INTRAVENOUS

## 2023-11-07 MED ORDER — LIDOCAINE HCL (CARDIAC) PF 100 MG/5ML IV SOSY
PREFILLED_SYRINGE | INTRAVENOUS | Status: DC | PRN
  Administered 2023-11-07: 60 mg via INTRAVENOUS

## 2023-11-07 MED ORDER — DEXMEDETOMIDINE HCL IN NACL 80 MCG/20ML IV SOLN
INTRAVENOUS | Status: DC | PRN
Start: 2023-11-07 — End: 2023-11-07
  Administered 2023-11-07: 8 ug via INTRAVENOUS
  Administered 2023-11-07: 12 ug via INTRAVENOUS

## 2023-11-07 NOTE — Anesthesia Postprocedure Evaluation (Signed)
 Anesthesia Post Note  Patient: [facility]  Procedure(s) Performed: COLONOSCOPY  Patient location during evaluation: PACU Anesthesia Type: General Level of consciousness: awake and alert Pain management: satisfactory to patient Vital Signs Assessment: post-procedure vital signs reviewed and stable Respiratory status: spontaneous breathing Cardiovascular status: stable Anesthetic complications: no   No notable events documented.   Last Vitals:  Vitals:   11/07/23 1247 11/07/23 1353  BP: (!) 174/84 (!) 102/47  Pulse: 76 79  Resp: 18 18  Temp: (!) 36.2 C (!) 36.3 C  SpO2: 98% 99%    Last Pain:  Vitals:   11/07/23 1353  TempSrc:   PainSc: 0-No pain                 VAN STAVEREN,Chasen Mendell

## 2023-11-07 NOTE — Op Note (Signed)
 [facility] Patient Name: Sandra Chung Procedure Date: 11/07/2023 1:17 PM MRN: [id number] Account #: [id number] Date of Birth: [birth year] Admit Type: Outpatient Age: 63 Room: [facility] ENDO ROOM 3 Gender: Female Note Status: Supervisor Override Instrument Name: Colon Scope [id number] Procedure:             Colonoscopy Indications:           Generalized abdominal pain, Follow-up for history of                         adenomatous polyps in the colon Providers:             Ole Schick MD, MD Referring MD:          Heron Nett MD, MD (Referring MD) Medicines:             Monitored Anesthesia Care Complications:         No immediate complications. Procedure:             Pre-Anesthesia Assessment:                        - Prior to the procedure, a History and Physical was                         performed, and patient medications and allergies were                         reviewed. The patient is competent. The risks and                         benefits of the procedure and the sedation options and                         risks were discussed with the patient. All questions                         were answered and informed consent was obtained.                         Patient identification and proposed procedure were                         verified by the physician, the nurse, the                         anesthesiologist, the anesthetist and the technician                         in the endoscopy suite. Mental Status Examination:                         alert and oriented. Airway Examination: normal                         oropharyngeal airway and neck mobility. Respiratory                         Examination: clear to auscultation. CV Examination:  normal. Prophylactic Antibiotics: The patient does not                         require prophylactic antibiotics. Prior                         Anticoagulants: The patient has  taken no anticoagulant                         or antiplatelet agents. ASA Grade Assessment: II - A                         patient with mild systemic disease. After reviewing                         the risks and benefits, the patient was deemed in                         satisfactory condition to undergo the procedure. The                         anesthesia plan was to use monitored anesthesia care                         (MAC). Immediately prior to administration of                         medications, the patient was re-assessed for adequacy                         to receive sedatives. The heart rate, respiratory                         rate, oxygen saturations, blood pressure, adequacy of                         pulmonary ventilation, and response to care were                         monitored throughout the procedure. The physical                         status of the patient was re-assessed after the                         procedure.                        After obtaining informed consent, the colonoscope was                         passed under direct vision. Throughout the procedure,                         the patient's blood pressure, pulse, and oxygen                         saturations were monitored continuously. The was  introduced through the anus and advanced to the the                         cecum, identified by appendiceal orifice and ileocecal                         valve. The colonoscopy was performed without                         difficulty. The patient tolerated the procedure well.                         The quality of the bowel preparation was inadequate.                         The ileocecal valve, appendiceal orifice, and rectum                         were photographed. Findings:      The perianal and digital rectal examinations were normal.      A few small-mouthed diverticula were found in the sigmoid colon.      Internal  hemorrhoids were found during retroflexion. The hemorrhoids       were Grade I (internal hemorrhoids that do not prolapse).      The exam was otherwise without abnormality on direct and retroflexion       views. Impression:            - Preparation of the colon was inadequate.                        - Diverticulosis in the sigmoid colon.                        - Internal hemorrhoids.                        - The examination was otherwise normal on direct and                         retroflexion views.                        - No specimens collected. Recommendation:        - Discharge patient to home.                        - Resume previous diet.                        - Continue present medications.                        - Repeat colonoscopy in 1 year because the bowel                         preparation was suboptimal.                        - Return to referring physician as previously  scheduled. Procedure Code(s):     --- Professional ---                        [phone], Colonoscopy, flexible; diagnostic, including                         collection of specimen(s) by brushing or washing, when                         performed (separate procedure) Diagnosis Code(s):     --- Professional ---                        K64.0, First degree hemorrhoids                        R10.84, Generalized abdominal pain                        K57.30, Diverticulosis of large intestine without                         perforation or abscess without bleeding CPT copyright 2022 American Medical Association. All rights reserved. The codes documented in this report are preliminary and upon coder review may  be revised to meet current compliance requirements. Ole Schick MD, MD 11/07/2023 1:56:00 PM Number of Addenda: 0 Note Initiated On: 11/07/2023 1:17 PM Scope Withdrawal Time: 0 hours 5 minutes 1 second  Total Procedure Duration: 0 hours 7 minutes 52 seconds  Estimated Blood  Loss:  Estimated blood loss: none.      [facility]

## 2023-11-07 NOTE — H&P (Signed)
 Outpatient short stay form Pre-procedure 11/07/2023  Sandra ONEIDA Schick, MD  Primary Physician: Jyl Railing, MD  Reason for visit:  Abdominal pain  History of present illness:    63 y/o lady with history of roux-en-y bypass, GERD, and COPD here for colonoscopy for generalized abdominal pain. Last colonoscopy over 5 years ago was normal. No blood thinners. Also with history of hysterectomy and cholecystectomy.    Current Facility-Administered Medications:    0.9 %  sodium chloride  infusion, , Intravenous, Continuous, Gaberiel Youngblood, Sandra ONEIDA, MD, Last Rate: 20 mL/hr at 11/07/23 1310, Continued from Pre-op at 11/07/23 1310  Medications Prior to Admission  Medication Sig Dispense Refill Last Dose/Taking   acetaminophen (TYLENOL) 160 mg/5 mL SOLN Take by mouth.   11/06/2023   albuterol (PROVENTIL) (2.5 MG/3ML) 0.083% nebulizer solution Inhale 2.5 mg into the lungs.   Past Week   Cholecalciferol (VITAMIN D-1000 MAX ST) 25 MCG (1000 UT) tablet Take 1,000 Units by mouth.   Past Week   cholecalciferol (VITAMIN D3) 10 MCG (400 UNIT) TABS tablet Take 400 Units by mouth daily.   Past Week   cyclobenzaprine (FLEXERIL) 5 MG tablet Take 5 mg by mouth at bedtime.   Past Week   diphenhydrAMINE (BENADRYL) 25 mg capsule Take 25 mg by mouth.   Past Week   docusate sodium (COLACE) 50 MG capsule Take 100 mg by mouth 2 (two) times daily.   Taking   estradiol (ESTRACE) 0.1 MG/GM vaginal cream Place 1 Applicatorful vaginally at bedtime.   Taking   fluticasone (VERAMYST) 27.5 MCG/SPRAY nasal spray Place 2 sprays into the nose.   11/06/2023   Fluticasone-Umeclidin-Vilant (TRELEGY ELLIPTA) 100-62.5-25 MCG/ACT AEPB Inhale 1 puff into the lungs daily.   11/06/2023   Fluticasone-Umeclidin-Vilant (TRELEGY ELLIPTA) 100-62.5-25 MCG/ACT AEPB Inhale 1 puff into the lungs.   11/06/2023   folic acid (FOLVITE) 1 MG tablet Take 1 mg by mouth.   11/06/2023   gabapentin (NEURONTIN) 100 MG capsule Take 100 mg by mouth 3 (three)  times daily. (Patient not taking: Reported on 10/30/2023)   Taking   gabapentin (NEURONTIN) 100 MG capsule Take 100 mg by mouth.   Past Week   meloxicam (MOBIC) 15 MG tablet TAKE 1 TABLET BY MOUTH ONCE DAILY FOR JAW PAIN   11/06/2023   montelukast (SINGULAIR) 10 MG tablet Take 10 mg by mouth at bedtime.   11/06/2023   montelukast (SINGULAIR) 10 MG tablet Take 10 mg by mouth.   11/06/2023   pantoprazole (PROTONIX) 20 MG tablet Take 20 mg by mouth daily. (Patient not taking: Reported on 10/30/2023)   Taking   pantoprazole (PROTONIX) 20 MG tablet Take 20 mg by mouth.   11/06/2023   SUMAtriptan (IMITREX) 50 MG tablet Take 50 mg by mouth.   Past Month   traMADol (ULTRAM) 50 MG tablet Take 50 mg by mouth every 6 (six) hours as needed for moderate pain (pain score 4-6).   Past Week   albuterol (VENTOLIN HFA) 108 (90 Base) MCG/ACT inhaler Inhale 2 puffs into the lungs. (Patient not taking: Reported on 11/07/2023)   Not Taking   clotrimazole-betamethasone (LOTRISONE) cream Apply topically.      Docusate Sodium (DSS) 100 MG CAPS Take 100 mg by mouth.      estradiol (ESTRACE) 0.1 MG/GM vaginal cream INSERT 1 GRAM VAGINALLY ONCE DAILY. PLACE NIGHTLY FOR 14 NIGHTS THEN 2 TIMES PER WEEK (Patient not taking: Reported on 10/30/2023)      Sod Picosulfate-Mag Ox-Cit Acd (CLENPIQ) 10-3.5-12 MG-GM -GM/160ML SOLN  Take 160 mLs by mouth. (Patient not taking: Reported on 11/07/2023)   Completed Course   Spacer/Aero-Holding Raguel (EASIVENT) inhaler See admin instructions.      traMADol (ULTRAM) 50 MG tablet Take 50 mg by mouth. (Patient not taking: Reported on 10/30/2023)        Allergies  Allergen Reactions   Albuterol Shortness Of Breath    Can use proair  Can use proair  Can use proair   Levaquin [Levofloxacin In D5w] Anaphylaxis   Levofloxacin Anaphylaxis and Other (See Comments)    Other Reaction: Throat swells   Penicillins Hives     Past Medical History:  Diagnosis Date   Asthma    Bariatric surgery status     Chronic pain    COPD (chronic obstructive pulmonary disease) (HCC)    Depressive disorder    GERD (gastroesophageal reflux disease)    Hepatitis C antibody test positive    History of PCR DNA positive for HSV2    Low serum vitamin B12    Malabsorption    Migraine with aura and without status migrainosus    Migraines    Mixed stress and urge urinary incontinence    Osteopenia of left hip    Osteopenia of spine    Rectocele    TIA (transient ischemic attack)     Review of systems:  Otherwise negative.    Physical Exam  Gen: Alert, oriented. Appears stated age.  HEENT: PERRLA. Lungs: No respiratory distress CV: RRR Abd: soft, benign, no masses Ext: No edema    Planned procedures: Proceed with colonoscopy. The patient understands the nature of the planned procedure, indications, risks, alternatives and potential complications including but not limited to bleeding, infection, perforation, damage to internal organs and possible oversedation/side effects from anesthesia. The patient agrees and gives consent to proceed.  Please refer to procedure notes for findings, recommendations and patient disposition/instructions.     Sandra ONEIDA Schick, MD [facility]

## 2023-11-07 NOTE — Interval H&P Note (Signed)
 History and Physical Interval Note:  11/07/2023 1:33 PM  Sandra Chung  has presented today for surgery, with the diagnosis of hx of adenomatous polyp of colon polyp,abdominal pain.  The various methods of treatment have been discussed with the patient and family. After consideration of risks, benefits and other options for treatment, the patient has consented to  Procedure(s): COLONOSCOPY (N/A) as a surgical intervention.  The patient's history has been reviewed, patient examined, no change in status, stable for surgery.  I have reviewed the patient's chart and labs.  Questions were answered to the patient's satisfaction.     Sandra Chung  Ok to proceed with colonoscopy

## 2023-11-07 NOTE — Anesthesia Preprocedure Evaluation (Signed)
 Anesthesia Evaluation  Patient identified by MRN, date of birth, ID band Patient awake    Reviewed: Allergy & Precautions, NPO status , Patient's Chart, lab work & pertinent test results  Airway Mallampati: III  TM Distance: >3 FB Neck ROM: Full    Dental  (+) Teeth Intact, Upper Dentures   Pulmonary neg pulmonary ROS, COPD,  COPD inhaler   Pulmonary exam normal        Cardiovascular Exercise Tolerance: Good negative cardio ROS Normal cardiovascular exam Rhythm:Regular Rate:Normal     Neuro/Psych  Headaches   Depression    TIAnegative neurological ROS  negative psych ROS   GI/Hepatic negative GI ROS, Neg liver ROS,GERD  Medicated,,  Endo/Other  negative endocrine ROS    Renal/GU negative Renal ROS  negative genitourinary   Musculoskeletal   Abdominal   Peds negative pediatric ROS (+)  Hematology negative hematology ROS (+)   Anesthesia Other Findings Past Medical History: No date: Asthma No date: Bariatric surgery status No date: Chronic pain No date: COPD (chronic obstructive pulmonary disease) (HCC) No date: Depressive disorder No date: GERD (gastroesophageal reflux disease) No date: Hepatitis C antibody test positive No date: History of PCR DNA positive for HSV2 No date: Low serum vitamin B12 No date: Malabsorption No date: Migraine with aura and without status migrainosus No date: Migraines No date: Mixed stress and urge urinary incontinence No date: Osteopenia of left hip No date: Osteopenia of spine No date: Rectocele No date: TIA (transient ischemic attack)  Past Surgical History: No date: ABDOMINAL HYSTERECTOMY     Comment:  total No date: CESAREAN SECTION No date: CHOLECYSTECTOMY No date: DIAGNOSTIC LAPAROSCOPY No date: GASTRIC BYPASS No date: INCONTINENCE SURGERY; N/A No date: REPAIR RECTOCELE; N/A  BMI    Body Mass Index: 30.78 kg/m      Reproductive/Obstetrics negative OB  ROS                              Anesthesia Physical Anesthesia Plan  ASA: 2  Anesthesia Plan: General   Post-op Pain Management:    Induction: Intravenous  PONV Risk Score and Plan: Propofol  infusion and TIVA  Airway Management Planned: Natural Airway and Nasal Cannula  Additional Equipment:   Intra-op Plan:   Post-operative Plan:   Informed Consent: I have reviewed the patients History and Physical, chart, labs and discussed the procedure including the risks, benefits and alternatives for the proposed anesthesia with the patient or authorized representative who has indicated his/her understanding and acceptance.     Dental Advisory Given  Plan Discussed with: CRNA  Anesthesia Plan Comments:         Anesthesia Quick Evaluation

## 2023-11-07 NOTE — Transfer of Care (Signed)
 Immediate Anesthesia Transfer of Care Note  Patient: Sandra Chung [facility]  Procedure(s) Performed: COLONOSCOPY  Patient Location: PACU  Anesthesia Type:General  Level of Consciousness: sedated  Airway & Oxygen Therapy: Patient Spontanous Breathing  Post-op Assessment: Report given to RN and Post -op Vital signs reviewed and stable  Post vital signs: Reviewed and stable  Last Vitals:  Vitals Value Taken Time  BP    Temp    Pulse    Resp    SpO2      Last Pain:  Vitals:   11/07/23 1247  TempSrc: Temporal  PainSc: 0-No pain         Complications: No notable events documented.

## 2023-12-07 ENCOUNTER — Inpatient Hospital Stay
Admission: EM | Admit: 2023-12-07 | Discharge: 2023-12-20 | DRG: 329 | Disposition: A | Discharge status: Home / Self Care | Attending: Internal Medicine | Admitting: Internal Medicine

## 2023-12-07 ENCOUNTER — Other Ambulatory Visit: Payer: Self-pay

## 2023-12-07 ENCOUNTER — Emergency Department

## 2023-12-07 DIAGNOSIS — Z1152 Encounter for screening for COVID-19: Secondary | ICD-10-CM

## 2023-12-07 DIAGNOSIS — F32A Depression, unspecified: Secondary | ICD-10-CM | POA: Diagnosis present

## 2023-12-07 DIAGNOSIS — K219 Gastro-esophageal reflux disease without esophagitis: Secondary | ICD-10-CM | POA: Diagnosis present

## 2023-12-07 DIAGNOSIS — Z9049 Acquired absence of other specified parts of digestive tract: Secondary | ICD-10-CM

## 2023-12-07 DIAGNOSIS — K7689 Other specified diseases of liver: Secondary | ICD-10-CM | POA: Diagnosis present

## 2023-12-07 DIAGNOSIS — Z881 Allergy status to other antibiotic agents status: Secondary | ICD-10-CM | POA: Diagnosis not present

## 2023-12-07 DIAGNOSIS — K631 Perforation of intestine (nontraumatic): Secondary | ICD-10-CM | POA: Diagnosis present

## 2023-12-07 DIAGNOSIS — R739 Hyperglycemia, unspecified: Secondary | ICD-10-CM | POA: Diagnosis present

## 2023-12-07 DIAGNOSIS — K460 Unspecified abdominal hernia with obstruction, without gangrene: Principal | ICD-10-CM | POA: Diagnosis present

## 2023-12-07 DIAGNOSIS — Z90710 Acquired absence of both cervix and uterus: Secondary | ICD-10-CM

## 2023-12-07 DIAGNOSIS — Z803 Family history of malignant neoplasm of breast: Secondary | ICD-10-CM

## 2023-12-07 DIAGNOSIS — K562 Volvulus: Secondary | ICD-10-CM | POA: Diagnosis present

## 2023-12-07 DIAGNOSIS — R030 Elevated blood-pressure reading, without diagnosis of hypertension: Secondary | ICD-10-CM | POA: Diagnosis present

## 2023-12-07 DIAGNOSIS — Z6833 Body mass index (BMI) 33.0-33.9, adult: Secondary | ICD-10-CM

## 2023-12-07 DIAGNOSIS — K56609 Unspecified intestinal obstruction, unspecified as to partial versus complete obstruction: Principal | ICD-10-CM | POA: Diagnosis present

## 2023-12-07 DIAGNOSIS — E66811 Obesity, class 1: Secondary | ICD-10-CM | POA: Diagnosis present

## 2023-12-07 DIAGNOSIS — Z8673 Personal history of transient ischemic attack (TIA), and cerebral infarction without residual deficits: Secondary | ICD-10-CM | POA: Diagnosis not present

## 2023-12-07 DIAGNOSIS — Z9884 Bariatric surgery status: Secondary | ICD-10-CM

## 2023-12-07 DIAGNOSIS — Z888 Allergy status to other drugs, medicaments and biological substances status: Secondary | ICD-10-CM | POA: Diagnosis not present

## 2023-12-07 DIAGNOSIS — K285 Chronic or unspecified gastrojejunal ulcer with perforation: Secondary | ICD-10-CM | POA: Diagnosis present

## 2023-12-07 DIAGNOSIS — J4489 Other specified chronic obstructive pulmonary disease: Secondary | ICD-10-CM | POA: Diagnosis present

## 2023-12-07 DIAGNOSIS — K650 Generalized (acute) peritonitis: Secondary | ICD-10-CM | POA: Diagnosis present

## 2023-12-07 DIAGNOSIS — E876 Hypokalemia: Secondary | ICD-10-CM | POA: Diagnosis not present

## 2023-12-07 DIAGNOSIS — Z880 Allergy status to penicillin: Secondary | ICD-10-CM | POA: Diagnosis not present

## 2023-12-07 DIAGNOSIS — K660 Peritoneal adhesions (postprocedural) (postinfection): Secondary | ICD-10-CM | POA: Diagnosis present

## 2023-12-07 DIAGNOSIS — M8588 Other specified disorders of bone density and structure, other site: Secondary | ICD-10-CM | POA: Diagnosis present

## 2023-12-07 DIAGNOSIS — G8929 Other chronic pain: Secondary | ICD-10-CM | POA: Diagnosis present

## 2023-12-07 DIAGNOSIS — R6884 Jaw pain: Secondary | ICD-10-CM | POA: Diagnosis present

## 2023-12-07 DIAGNOSIS — M545 Low back pain, unspecified: Secondary | ICD-10-CM | POA: Diagnosis not present

## 2023-12-07 DIAGNOSIS — Z8711 Personal history of peptic ulcer disease: Secondary | ICD-10-CM

## 2023-12-07 DIAGNOSIS — Z79899 Other long term (current) drug therapy: Secondary | ICD-10-CM

## 2023-12-07 DIAGNOSIS — N179 Acute kidney failure, unspecified: Secondary | ICD-10-CM | POA: Diagnosis not present

## 2023-12-07 LAB — CBC
HCT: 42.9 % (ref 36.0–46.0)
Hemoglobin: 14.3 g/dL (ref 12.0–15.0)
MCH: 31.6 pg (ref 26.0–34.0)
MCHC: 33.3 g/dL (ref 30.0–36.0)
MCV: 94.7 fL (ref 80.0–100.0)
Platelets: 300 K/uL (ref 150–400)
RBC: 4.53 MIL/uL (ref 3.87–5.11)
RDW: 12.3 % (ref 11.5–15.5)
WBC: 11.1 K/uL — ABNORMAL HIGH (ref 4.0–10.5)
nRBC: 0 % (ref 0.0–0.2)

## 2023-12-07 LAB — COMPREHENSIVE METABOLIC PANEL WITH GFR
ALT: 19 U/L (ref 0–44)
AST: 23 U/L (ref 15–41)
Albumin: 4 g/dL (ref 3.5–5.0)
Alkaline Phosphatase: 98 U/L (ref 38–126)
Anion gap: 13 (ref 5–15)
BUN: 15 mg/dL (ref 8–23)
CO2: 22 mmol/L (ref 22–32)
Calcium: 9.7 mg/dL (ref 8.9–10.3)
Chloride: 104 mmol/L (ref 98–111)
Creatinine, Ser: 0.92 mg/dL (ref 0.44–1.00)
GFR, Estimated: >60 mL/min (ref >60)
Glucose, Bld: 125 mg/dL — ABNORMAL HIGH (ref 70–99)
Potassium: 4.6 mmol/L (ref 3.5–5.1)
Sodium: 139 mmol/L (ref 135–145)
Total Bilirubin: 0.9 mg/dL (ref 0.0–1.2)
Total Protein: 6.8 g/dL (ref 6.5–8.1)

## 2023-12-07 LAB — URINALYSIS, ROUTINE W REFLEX MICROSCOPIC
Bilirubin Urine: NEGATIVE
Glucose, UA: NEGATIVE mg/dL
Hgb urine dipstick: NEGATIVE
Ketones, ur: 80 mg/dL — AB
Leukocytes,Ua: NEGATIVE
Nitrite: NEGATIVE
Protein, ur: NEGATIVE mg/dL
Specific Gravity, Urine: >1.046 — ABNORMAL HIGH (ref 1.005–1.030)
pH: 5 (ref 5.0–8.0)

## 2023-12-07 LAB — LACTIC ACID, PLASMA: Lactic Acid, Venous: 0.8 mmol/L (ref 0.5–1.9)

## 2023-12-07 LAB — RESP PANEL BY RT-PCR (RSV, FLU A&B, COVID)  RVPGX2
Influenza A by PCR: NEGATIVE
Influenza B by PCR: NEGATIVE
Resp Syncytial Virus by PCR: NEGATIVE
SARS Coronavirus 2 by RT PCR: NEGATIVE

## 2023-12-07 LAB — LIPASE, BLOOD: Lipase: 33 U/L (ref 11–51)

## 2023-12-07 MED ORDER — MONTELUKAST SODIUM 10 MG PO TABS
10.0000 mg | ORAL_TABLET | Freq: Every day | ORAL | Status: DC
  Administered 2023-12-18 – 2023-12-19 (×2): 10 mg via ORAL
  Filled 2023-12-07 (×6): qty 1

## 2023-12-07 MED ORDER — LACTATED RINGERS IV SOLN: INTRAVENOUS | Status: AC

## 2023-12-07 MED ORDER — ONDANSETRON HCL 4 MG/2ML IJ SOLN
4.0000 mg | Freq: Once | INTRAMUSCULAR | Status: AC
  Administered 2023-12-07: 4 mg via INTRAVENOUS
  Filled 2023-12-07: qty 2

## 2023-12-07 MED ORDER — HYDROMORPHONE HCL 1 MG/ML IJ SOLN
0.5000 mg | INTRAMUSCULAR | Status: DC | PRN
  Administered 2023-12-07 – 2023-12-11 (×10): 0.5 mg via INTRAVENOUS
  Filled 2023-12-07 (×13): qty 0.5

## 2023-12-07 MED ORDER — SUMATRIPTAN SUCCINATE 50 MG PO TABS: 50.0000 mg | ORAL_TABLET | Freq: Every day | ORAL | Status: DC | PRN

## 2023-12-07 MED ORDER — BENZOCAINE-MENTHOL 6-10 MG MT LOZG: 1.0000 | LOZENGE | OROMUCOSAL | Status: DC | PRN

## 2023-12-07 MED ORDER — PANTOPRAZOLE SODIUM 40 MG IV SOLR
40.0000 mg | INTRAVENOUS | Status: DC
  Administered 2023-12-07 – 2023-12-08 (×2): 40 mg via INTRAVENOUS
  Filled 2023-12-07 (×2): qty 10

## 2023-12-07 MED ORDER — MORPHINE SULFATE (PF) 4 MG/ML IV SOLN
4.0000 mg | Freq: Once | INTRAVENOUS | Status: AC
  Administered 2023-12-07: 4 mg via INTRAVENOUS
  Filled 2023-12-07: qty 1

## 2023-12-07 MED ORDER — BUDESON-GLYCOPYRROL-FORMOTEROL 160-9-4.8 MCG/ACT IN AERO
2.0000 | INHALATION_SPRAY | Freq: Two times a day (BID) | RESPIRATORY_TRACT | Status: DC
  Administered 2023-12-07 – 2023-12-20 (×21): 2 via RESPIRATORY_TRACT
  Filled 2023-12-07 (×2): qty 5.9

## 2023-12-07 MED ORDER — HYDROMORPHONE HCL 1 MG/ML IJ SOLN
0.2000 mg | INTRAMUSCULAR | Status: DC | PRN
  Administered 2023-12-16: 0.2 mg via INTRAVENOUS
  Filled 2023-12-07: qty 0.5

## 2023-12-07 MED ORDER — MENTHOL 3 MG MT LOZG
1.0000 | LOZENGE | OROMUCOSAL | Status: DC | PRN
  Administered 2023-12-07: 3 mg via ORAL
  Filled 2023-12-07: qty 9

## 2023-12-07 MED ORDER — IOHEXOL 300 MG/ML  SOLN
80.0000 mL | Freq: Once | INTRAMUSCULAR | Status: AC | PRN
  Administered 2023-12-07: 80 mL via INTRAVENOUS

## 2023-12-07 MED ORDER — ONDANSETRON HCL 4 MG/2ML IJ SOLN
4.0000 mg | Freq: Four times a day (QID) | INTRAMUSCULAR | Status: DC | PRN
  Administered 2023-12-07 – 2023-12-20 (×22): 4 mg via INTRAVENOUS
  Filled 2023-12-07 (×25): qty 2

## 2023-12-07 NOTE — H&P (Addendum)
 History and Physical    Patient: Sandra Chung FMW:[id number] DOB: 30-Nov-1960 DOA: 12/07/2023 DOS: the patient was seen and examined on 12/07/2023 PCP: Jyl Railing, MD  Patient coming from: Home  Chief Complaint:  Chief Complaint  Patient presents with   Abdominal Pain   HPI: Sandra Chung is a 63 y.o. female with medical history significant of GERD, COPD, multiple prior abdominal surgery including gastric bypass, hysterectomy, cholecystectomy presenting to the ER due to abdominal pain.  Patient reports having right lower quadrant pain since 2 days, also reports chills, nausea/vomiting which worsened since this morning.  Denies fever, SOB, chest pain, headache. Lives at home with her mom, independent at baseline  On presentation to ED, afebrile, HR 106 improved to 85, BP 155/107, saturating 97% on room air. Workup revealed CMP unremarkable except mild hyperglycemia, CBC with leukocytosis 11.1.  Flu/COVID/RSV negative, UA pending CT abdomen/pelvis shows s/p gastric bypass with signs of high-grade SBO.  Transition point is in the ventral aspect of RLQ of the abdomen.  Distal to the transition point, there is mild wall thickening of the small bowel with mild signs of pneumatosis.  Venous congestion with fluid and edema seen within the small bowel mesentery.  Free fluid noted within abdomen and pelvis.  Increased caliber of CBD measuring 1.2 cm NG tube placed  Review of Systems: As mentioned in the history of present illness. All other systems reviewed and are negative. 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 positive    History of PCR DNA positive for HSV2    Low serum vitamin B12    Malabsorption    Migraine with aura and without status migrainosus    Migraines    Mixed stress  and urge urinary incontinence    Osteopenia of left hip    Osteopenia of spine    Rectocele    TIA (transient ischemic attack)    Past Surgical History:  Procedure Laterality Date   ABDOMINAL HYSTERECTOMY     total   CESAREAN SECTION     CHOLECYSTECTOMY     COLONOSCOPY N/A 11/07/2023   Procedure: COLONOSCOPY;  Surgeon: Maryruth Ole DASEN, MD;  Location: [facility] ENDOSCOPY;  Service: Endoscopy;  Laterality: N/A;   DIAGNOSTIC LAPAROSCOPY     GASTRIC BYPASS     INCONTINENCE SURGERY N/A    REPAIR RECTOCELE N/A    Social History:  reports that she has never smoked. She has never used smokeless tobacco. She reports current alcohol use. She reports that she does not use drugs.  Allergies  Allergen Reactions   Albuterol Shortness Of Breath    Can use proair  Can use proair  Can use proair   Levaquin [Levofloxacin In D5w] Anaphylaxis   Levofloxacin Anaphylaxis and Other (See Comments)    Other Reaction: Throat swells   Penicillins Hives    Says that she cannot take Penicillin, but can take Amoxicillin    Family History  Problem Relation Age of Onset   Breast cancer Maternal Aunt 60    Prior to Admission medications   Medication Sig Start Date End Date Taking? Authorizing Provider  acetaminophen (TYLENOL) 160 mg/5 mL SOLN Take by mouth.    [provider]  albuterol (PROVENTIL) (2.5 MG/3ML) 0.083% nebulizer solution Inhale 2.5  mg into the lungs. 01/29/10   [provider]  albuterol (VENTOLIN HFA) 108 (90 Base) MCG/ACT inhaler Inhale 2 puffs into the lungs. Patient not taking: Reported on 11/07/2023 08/29/21   [provider]  Cholecalciferol (VITAMIN D-1000 MAX ST) 25 MCG (1000 UT) tablet Take 1,000 Units by mouth. 08/01/20   [provider]  cholecalciferol (VITAMIN D3) 10 MCG (400 UNIT) TABS tablet Take 400 Units by mouth daily.    [provider]  clotrimazole-betamethasone (LOTRISONE) cream Apply topically. 03/05/13   [provider]  cyclobenzaprine (FLEXERIL) 5 MG tablet Take 5 mg by mouth at bedtime.    [provider]  diphenhydrAMINE (BENADRYL) 25 mg capsule Take 25 mg by mouth. 05/10/17   [provider]  docusate sodium (COLACE) 50 MG capsule Take 100 mg by mouth 2 (two) times daily.    [provider]  Docusate Sodium (DSS) 100 MG CAPS Take 100 mg by mouth. 09/07/13   [provider]  estradiol (ESTRACE) 0.1 MG/GM vaginal cream Place 1 Applicatorful vaginally at bedtime.    [provider]  estradiol (ESTRACE) 0.1 MG/GM vaginal cream INSERT 1 GRAM VAGINALLY ONCE DAILY. PLACE NIGHTLY FOR 14 NIGHTS THEN 2 TIMES PER WEEK Patient not taking: Reported on 10/30/2023 12/08/22   [provider]  fluticasone (VERAMYST) 27.5 MCG/SPRAY nasal spray Place 2 sprays into the nose. 06/07/22   [provider]  Fluticasone-Umeclidin-Vilant (TRELEGY ELLIPTA) 100-62.5-25 MCG/ACT AEPB Inhale 1 puff into the lungs daily.    [provider]  Fluticasone-Umeclidin-Vilant (TRELEGY ELLIPTA) 100-62.5-25 MCG/ACT AEPB Inhale 1 puff into the lungs. 06/12/23   [provider]  folic acid (FOLVITE) 1 MG tablet Take 1 mg by mouth. 12/23/11   [provider]  gabapentin (NEURONTIN) 100 MG capsule Take 100 mg by mouth 3 (three) times daily. Patient not taking: Reported on 10/30/2023    [provider]  gabapentin (NEURONTIN) 100 MG capsule Take 100 mg by mouth. 05/14/14   [provider]  meloxicam (MOBIC) 15 MG tablet TAKE 1 TABLET BY MOUTH ONCE DAILY FOR JAW PAIN 10/11/23   [provider]  montelukast  (SINGULAIR ) 10 MG tablet Take 10 mg by mouth at bedtime.    [provider]  montelukast  (SINGULAIR ) 10 MG tablet Take 10 mg by mouth. 07/03/21   [provider]  pantoprazole  (PROTONIX ) 20 MG tablet Take 20 mg by mouth daily. Patient not taking: Reported on 10/30/2023    [provider]  pantoprazole   (PROTONIX ) 20 MG tablet Take 20 mg by mouth. 08/06/23 08/05/24  [provider]  Sod Picosulfate-Mag Ox-Cit Acd (CLENPIQ) 10-3.5-12 MG-GM -GM/160ML SOLN Take 160 mLs by mouth. Patient not taking: Reported on 11/07/2023 08/26/23   [provider]  Spacer/Aero-Holding Chambers (EASIVENT) inhaler See admin instructions. 01/28/23 01/28/24  [provider]  SUMAtriptan  (IMITREX ) 50 MG tablet Take 50 mg by mouth. 02/02/14   [provider]  traMADol (ULTRAM) 50 MG tablet Take 50 mg by mouth every 6 (six) hours as needed for moderate pain (pain score 4-6).    [provider]  traMADol (ULTRAM) 50 MG tablet Take 50 mg by mouth. Patient not taking: Reported on 10/30/2023    [provider]    Physical Exam: Vitals:   12/07/23 0721 12/07/23 0830 12/07/23 0900 12/07/23 1108  BP:  (!) 158/99 (!) 145/80 (!) 169/86  Pulse:  92 85 86  Resp:   18 17  Temp:    98.9 F (37.2 C)  SpO2:  97% 95% 97%  Weight: 74.8 kg     Height: 5' 1 (1.549 m)      General:  Awake, interactive  CV:   RRR Resp:   Clear to auscultation bilaterally, no wheezing Abd:   Nondistended, soft, mild generalized tenderness to palpation worse in the right lower quadrant without rebound or guarding Neuro: No gross focal deficit EXT: RLE more swollen > LLE  Data Reviewed: Labs and imaging reviewed  Assessment and Plan:  Small bowel obstruction - Mild leukocytosis - CT demonstrates SBO with small area of twisted bowel and pneumatosis. - was discussed with general surgery by ER, General surgery consult - NPO, IV fluids - check lactic acid - Pain management, antiemetics as needed - NGT with LIWS  GERD - IV PPI  COPD - Not in exacerbation, continue home inhalers   Advance Care Planning:   Code Status: Full Code  Consults: General Surgery  Family Communication: Discussed with patient and mother at the bedside  Severity of Illness: The appropriate patient status for this  patient is INPATIENT. Inpatient status is judged to be reasonable and necessary in order to provide the required intensity of service to ensure the patient's safety. The patient's presenting symptoms, physical exam findings, and initial radiographic and laboratory data in the context of their chronic comorbidities is felt to place them at high risk for further clinical deterioration. Furthermore, it is not anticipated that the patient will be medically stable for discharge from the hospital within 2 midnights of admission.   * I certify that at the point of admission it is my clinical judgment that the patient will require inpatient hospital care spanning beyond 2 midnights from the point of admission due to high intensity of service, high risk for further deterioration and high frequency of surveillance required.*  Author: Laree Lock, MD 12/07/2023 11:43 AM  For on call review [email or web address].

## 2023-12-07 NOTE — ED Provider Notes (Signed)
 [facility] Provider Note    Event Date/Time   First MD Initiated Contact with Patient 12/07/23 [phone]     (approximate)   History   Abdominal Pain   HPI  Sandra Chung is a 63 year old female with history of multiple prior abdominal surgeries including gastric bypass surgery, hysterectomy, cholecystectomy, COPD, asthma presenting to the emergency department for evaluation of abdominal pain.  Patient reports that 2 days ago she had onset of abdominal pain worse in her right lower quadrant.  Does report associated chills, nausea, vomiting.  Has her appendix.  No reported fevers.     Physical Exam   Triage Vital Signs: ED Triage Vitals  Encounter Vitals Group     BP 12/07/23 0720 (!) 155/107     Girls Systolic BP Percentile --      Girls Diastolic BP Percentile --      Boys Systolic BP Percentile --      Boys Diastolic BP Percentile --      Pulse Rate 12/07/23 0720 (!) 106     Resp 12/07/23 0720 20     Temp 12/07/23 0720 98 F (36.7 C)     Temp src --      SpO2 12/07/23 0720 97 %     Weight 12/07/23 0721 165 lb (74.8 kg)     Height 12/07/23 0721 5' 1 (1.549 m)     Head Circumference --      Peak Flow --      Pain Score 12/07/23 0720 9     Pain Loc --      Pain Education --      Exclude from Growth Chart --     Most recent vital signs: Vitals:   12/07/23 0830 12/07/23 0900  BP: (!) 158/99 (!) 145/80  Pulse: 92 85  Resp:  18  Temp:    SpO2: 97% 95%     General: Awake, interactive  CV:  Tachycardic with regular rhythm, good peripheral perfusion Resp:  Unlabored respirations.  Abd:  Nondistended, soft, mild generalized tenderness to palpation worse in the right lower quadrant without rebound or guarding Neuro:  Symmetric facial movement, fluid speech   ED Results / Procedures / Treatments   Labs (all labs ordered are listed, but only abnormal results are displayed) Labs Reviewed  COMPREHENSIVE METABOLIC PANEL WITH GFR - Abnormal;  Notable for the following components:      Result Value   Glucose, Bld 125 (*)    All other components within normal limits  CBC - Abnormal; Notable for the following components:   WBC 11.1 (*)    All other components within normal limits  RESP PANEL BY RT-PCR (RSV, FLU A&B, COVID)  RVPGX2  LIPASE, BLOOD  URINALYSIS, ROUTINE W REFLEX MICROSCOPIC     EKG EKG independently reviewed and interpreted by myself demonstrates:    RADIOLOGY Imaging independently reviewed and interpreted by myself demonstrates:  CT abdomen pelvis demonstrates SBO  Formal Radiology Read:  CT ABDOMEN PELVIS W CONTRAST Result Date: 12/07/2023 EXAM: CT ABDOMEN AND PELVIS WITH CONTRAST 12/07/2023 08:43:46 AM TECHNIQUE: CT of the abdomen and pelvis was performed with the administration of intravenous contrast. Multiplanar reformatted images are provided for review. Automated exposure control, iterative reconstruction, and/or weight-based adjustment of the mA/kV was utilized to reduce the radiation dose to as low as reasonably achievable. COMPARISON: None available. CLINICAL HISTORY: RLQ abdominal pain. FINDINGS: LOWER CHEST: Moderate-sized hiatal hernia identified. LIVER: Too small to characterize low-density structures within  the inferior right lobe of liver, the largest measuring 8 mm (image 32/2). Simple-appearing liver cysts are noted within segments 4b and 8, the largest measuring 3.1 cm. GALLBLADDER AND BILE DUCTS: Increased caliber of the common bile duct measures 1.2 cm. No calcified stones identified along the course of the CBD. SPLEEN: No acute abnormality. PANCREAS: No acute abnormality. ADRENAL GLANDS: No acute abnormality. KIDNEYS, URETERS AND BLADDER: No stones in the kidneys or ureters. No hydronephrosis. No perinephric or periureteral stranding. Urinary bladder is unremarkable. GI AND BOWEL: Postoperative changes from previous gastric bypass surgery. There is abnormal dilatation of the proximal and mid small  bowel loops with multiple air-fluid levels, measuring up to 4.6 cm in diameter. Transition point to decreased caliber distal small bowel is in the right lower quadrant of the abdomen (image 51/2) where there is focal twisting of the bowel. Just beyond the transition point, the proximal nondilated small bowel exhibits mild wall thickening measuring up to 5 mm. No pneumatosis identified. No abnormal dilatation of the colon. PERITONEUM AND RETROPERITONEUM: Small volume of free fluid is noted within the mesentery, around the inferior margin of the right lobe of liver, and right hemipelvis. No loculated fluid collections identified. No signs of pneumoperitoneum. VASCULATURE: The mesenteric vasculature appears patent. LYMPH NODES: No abdominal or pelvic adenopathy. REPRODUCTIVE ORGANS: Status post hysterectomy. No mass identified within either adnexa. BONES AND SOFT TISSUES: No acute osseous abnormality. No focal soft tissue abnormality. IMPRESSION: 1. The patient is status post gastric bypass with signs of high-grade small bowel obstruction The transition point is in the ventral aspect of the right lower quadrant of the abdomen where there is twisting of the bowel loops. Distal to the transition point, there is mild wall thickening of the small bowel with mild signs of pneumatosis. 2. Venous congestion with fluid and edema seen within the small bowel mesentery. 3. Free fluid noted within the abdomen and pelvis. No signs of abscess or pneumoperitoneum. 4. Moderate-sized hiatal hernia. 5. Increased caliber of the common bile duct measuring 1.2 cm. No calcified stones identified along the course of the CBD. Electronically signed by: Waddell Calk MD 12/07/2023 09:06 AM EDT RP Workstation: [id number]    PROCEDURES:  Critical Care performed: No  Procedures   MEDICATIONS ORDERED IN ED: Medications  lactated ringers  infusion (has no administration in time range)  ondansetron  (ZOFRAN ) injection 4 mg (4 mg  Intravenous Given 12/07/23 0831)  morphine  (PF) 4 MG/ML injection 4 mg (4 mg Intravenous Given 12/07/23 0830)  iohexol  (OMNIPAQUE ) 300 MG/ML solution 80 mL (80 mLs Intravenous Contrast Given 12/07/23 0836)     IMPRESSION / MDM / ASSESSMENT AND PLAN / ED COURSE  I reviewed the triage vital signs and the nursing notes.  Differential diagnosis includes, but is not limited to, appendicitis, colitis, SBO, diverticulitis, other acute intra-abdominal process, viral illness  Patient's presentation is most consistent with acute presentation with potential threat to life or bodily function.  63 year old female presenting to the Emergency Department for evaluation of abdominal pain.  Mild tachycardia on presentation.  Labs with CBC with mild leukocytosis, CMP without critical derangements.  Normal lipase.  Will obtain CT, add on viral swab.  Pending urine.  CT demonstrates small bowel obstruction with small area of twisted bowel and pneumatosis.  Case discussed with Dr. Marinda with general surgery.  He is scrubbing into the OR, but will evaluate the patient.  Does request hospitalist admission.  Will reach out to hospitalist team. Clinical Course as of 12/07/23 1023  Sun Dec 07, 2023  1004 Case discussed with hospitalist team.  They will evaluate for anticipated admission. [NR]    Clinical Course User Index [NR] Levander Slate, MD     FINAL CLINICAL IMPRESSION(S) / ED DIAGNOSES   Final diagnoses:  Small bowel obstruction (HCC)     Rx / DC Orders   ED Discharge Orders     None        Note:  This document was prepared using Dragon voice recognition software and may include unintentional dictation errors.   Levander Slate, MD 12/07/23 [phone]

## 2023-12-07 NOTE — ED Notes (Signed)
 Advanced NG tube 9cm per xray reading. Secured at 57cm at the nare.

## 2023-12-07 NOTE — ED Triage Notes (Signed)
 Pt to ED for lower abd pain x2 days. +chills, emesis, h/a.

## 2023-12-07 NOTE — Plan of Care (Signed)
   Problem: Education: Goal: Knowledge of General Education information will improve Description: Including pain rating scale, medication(s)/side effects and non-pharmacologic comfort measures Outcome: Progressing   Problem: Clinical Measurements: Goal: Ability to maintain clinical measurements within normal limits will improve Outcome: Progressing Goal: Will remain free from infection Outcome: Progressing

## 2023-12-07 NOTE — ED Notes (Signed)
 Patient transported to CT

## 2023-12-07 NOTE — Consult Note (Signed)
 Patient ID: Sandra Chung, female   DOB: 23-Jan-1961, 63 y.o.   MRN: [id number] CC: Small Bowel OBstruction History of Present Illness Sandra Chung is a 63 y.o. 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  The patient reports that yesterday she developed right lower quadrant abdominal pain.  She says this was sharp in nature and did radiate to her epigastrium.  This was also associated with nausea and vomiting.  She said that her last bowel movement was yesterday afternoon but she is continue to pass some flatus although it is unsure if she has passed any today.  She says that she has chronic abdominal pain but this is different from that pain.  Due to the pain she came to the ED for evaluation.  Here she was found to have a leukocytosis.  She also had a CT scan that I personally reviewed that does have a transition to zone in the right lower quadrant.  There is no pneumatosis although there is some kinking of the bowel and there is some free fluid within the abdomen and mesentery.  Surgery is consistent with a Roux-en-Y gastric bypass, repair of internal hernia, cholecystectomy and hysterectomy..  Past Medical History 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 positive    History of PCR DNA positive for HSV2    Low serum vitamin B12    Malabsorption    Migraine with aura and without status migrainosus    Migraines    Mixed stress and urge urinary incontinence    Osteopenia of left hip    Osteopenia of spine    Rectocele    TIA (transient ischemic attack)        Past Surgical History:  Procedure Laterality Date   ABDOMINAL HYSTERECTOMY      total   CESAREAN SECTION     CHOLECYSTECTOMY     COLONOSCOPY N/A 11/07/2023   Procedure: COLONOSCOPY;  Surgeon: Sandra Ole DASEN, MD;  Location: [facility] ENDOSCOPY;  Service: Endoscopy;  Laterality: N/A;   DIAGNOSTIC LAPAROSCOPY     GASTRIC BYPASS     INCONTINENCE SURGERY N/A    REPAIR RECTOCELE N/A     Allergies  Allergen Reactions   Albuterol Shortness Of Breath    Can use proair  Can use proair  Can use proair   Levaquin [Levofloxacin In D5w] Anaphylaxis   Levofloxacin Anaphylaxis and Other (See Comments)    Other Reaction: Throat swells   Penicillins Hives    Says that she cannot take Penicillin, but can take Amoxicillin    Current Facility-Administered Medications  Medication Dose Route Frequency Provider Last Rate Last Admin   budesonide -glycopyrrolate -formoterol  (BREZTRI ) 160-9-4.8 MCG/ACT inhaler 2 puff  2 puff Inhalation BID Ponnala, Shruthi, MD       HYDROmorphone  (DILAUDID ) injection 0.2 mg  0.2 mg Intravenous Q4H PRN Ponnala, Shruthi, MD       HYDROmorphone  (DILAUDID ) injection 0.5 mg  0.5 mg Intravenous Q4H PRN Jerelene, Shruthi, MD       lactated ringers  infusion   Intravenous Continuous Ponnala, Shruthi, MD 75 mL/hr at 12/07/23 1124 New Bag at 12/07/23 1124   montelukast  (SINGULAIR ) tablet 10 mg  10 mg Oral QHS Ponnala, Shruthi, MD       ondansetron  (ZOFRAN ) injection 4 mg  4 mg Intravenous Q6H PRN Ponnala, Shruthi, MD       pantoprazole  (PROTONIX ) injection 40 mg  40 mg Intravenous Q24H Ponnala, Shruthi, MD       SUMAtriptan  (IMITREX ) tablet 50 mg  50 mg Oral Daily PRN Ponnala, Shruthi, MD        Family History Family History  Problem Relation Age of Onset   Breast cancer Maternal Aunt 31       Social History Social History   Tobacco Use   Smoking status: Never   Smokeless tobacco: Never  Vaping Use   Vaping status: Never Used  Substance Use Topics   Alcohol use: Yes    Comment: rarely   Drug use: No        ROS Full ROS of systems performed  and is otherwise negative there than what is stated in the HPI  Physical Exam Blood pressure (!) 169/86, pulse 86, temperature 98.9 F (37.2 C), temperature source Oral, resp. rate 17, height 5' 1 (1.549 m), weight 74.8 kg, SpO2 97%.  Alert and oriented x 3, normal work of breathing on room air, regular rate and rhythm, abdomen is soft, distended there is some tenderness in the right lower quadrant without any rebound tenderness or guarding, some tympany as well as tenderness in the epigastrium.  Data Reviewed I independently reviewed her lactic acid which was normal.  I also noted that she has a leukocytosis of 11,000.  CT scan independently reviewed by me.  There is some dilated loops of proximal small bowel that transitions in the right quadrant.  There is some free fluid within the abdomen tracking along the mesentery.  I have personally reviewed the patient's imaging and medical records.    Assessment    Patient with extensive surgical history that presents with small bowel obstruction.  I discussed with her that this is likely adhesive disease in etiology.  We will try to decompress with NGT  today and if no improvement we will likely perform a contrast study tomorrow.  I did discuss with her that I am a little bit concerned with her given that she has free fluid within her abdomen and a slight leukocytosis.  We will monitor her vital signs closely and if she has significant increase in her pain then this may push us  towards operative intervention for this.  Continue fluid resuscitation     Sandra Chung 12/07/2023, 1:18 PM

## 2023-12-08 ENCOUNTER — Inpatient Hospital Stay

## 2023-12-08 DIAGNOSIS — K56609 Unspecified intestinal obstruction, unspecified as to partial versus complete obstruction: Secondary | ICD-10-CM | POA: Diagnosis not present

## 2023-12-08 LAB — CBC
HCT: 37.6 % (ref 36.0–46.0)
Hemoglobin: 12.2 g/dL (ref 12.0–15.0)
MCH: 31.6 pg (ref 26.0–34.0)
MCHC: 32.4 g/dL (ref 30.0–36.0)
MCV: 97.4 fL (ref 80.0–100.0)
Platelets: 251 K/uL (ref 150–400)
RBC: 3.86 MIL/uL — ABNORMAL LOW (ref 3.87–5.11)
RDW: 12.3 % (ref 11.5–15.5)
WBC: 9.4 K/uL (ref 4.0–10.5)
nRBC: 0 % (ref 0.0–0.2)

## 2023-12-08 LAB — BASIC METABOLIC PANEL WITH GFR
Anion gap: 6 (ref 5–15)
BUN: 15 mg/dL (ref 8–23)
CO2: 26 mmol/L (ref 22–32)
Calcium: 8.7 mg/dL — ABNORMAL LOW (ref 8.9–10.3)
Chloride: 109 mmol/L (ref 98–111)
Creatinine, Ser: 0.76 mg/dL (ref 0.44–1.00)
GFR, Estimated: >60 mL/min (ref >60)
Glucose, Bld: 84 mg/dL (ref 70–99)
Potassium: 3.9 mmol/L (ref 3.5–5.1)
Sodium: 141 mmol/L (ref 135–145)

## 2023-12-08 MED ORDER — DIATRIZOATE MEGLUMINE & SODIUM 66-10 % PO SOLN
90.0000 mL | Freq: Once | ORAL | Status: AC
  Administered 2023-12-08: 90 mL via NASOGASTRIC

## 2023-12-08 MED ORDER — SODIUM CHLORIDE 0.9 % IV SOLN
12.5000 mg | Freq: Once | INTRAVENOUS | Status: AC
Start: 2023-12-08 — End: 2023-12-08
  Administered 2023-12-08: 12.5 mg via INTRAVENOUS
  Filled 2023-12-08: qty 12.5

## 2023-12-08 MED ORDER — HYDRALAZINE HCL 20 MG/ML IJ SOLN: 5.0000 mg | Freq: Four times a day (QID) | INTRAMUSCULAR | Status: DC | PRN

## 2023-12-08 MED ORDER — LACTATED RINGERS IV SOLN: INTRAVENOUS | Status: AC

## 2023-12-08 NOTE — Progress Notes (Addendum)
 [facility] SURGICAL PROGRESS NOTE (cpt [id number])  Hospital Day(s): 1.   Interval History: Patient seen and examined, no acute events or new complaints overnight. Patient reports she continues to have central abdominal pain and nausea. She did get gastrografin  this AM and this exacerbated her symptoms. No fever, chills. Previous leukocytosis resolved; WBC 9.4K. Hgb to 12.2. Renal function normal; sCr - 0.76; UO - 400 ccs + unmeasured. No electrolyte derangements. NGT 195 ccs out in last 24 hours.   Review of Systems:  Constitutional: denies fever, chills  HEENT: denies cough or congestion  Respiratory: denies any shortness of breath  Cardiovascular: denies chest pain or palpitations  Gastrointestinal: + abdominal pain, + nausea  Genitourinary: denies burning with urination or urinary frequency Musculoskeletal: denies pain, decreased motor or sensation  Vital signs in last 24 hours: [min-max] current  Temp:  [98.1 F (36.7 C)-98.9 F (37.2 C)] 98.7 F (37.1 C) (09/15 0808) Pulse Rate:  [85-95] 88 (09/15 0808) Resp:  [14-18] 18 (09/15 0808) BP: (129-176)/(72-90) 176/88 (09/15 0808) SpO2:  [90 %-97 %] 96 % (09/15 0808) Weight:  [74.8 kg] 74.8 kg (09/14 1200)     Height: 5' 1 (154.9 cm) Weight: 74.8 kg BMI (Calculated): 31.17   Intake/Output last 2 shifts:  09/14 0701 - 09/15 0700 In: 1396.6 [I.V.:1306.6; NG/GT:90] Out: 595 [Urine:400; Emesis/NG output:195]   Physical Exam:  Constitutional: alert, cooperative and no distress  HENT: normocephalic without obvious abnormality; NGT in place, clamped at time of evaluation  Eyes: PERRL, EOM's grossly intact and symmetric  Respiratory: breathing non-labored at rest  Cardiovascular: regular rate and sinus rhythm  Gastrointestinal: Soft, tenderness worse centrally, she does not appear overtly distended, no rebound/guarding. She is not peritonitic  Musculoskeletal:  no edema or wounds, motor and sensation grossly intact, NT     Labs:     Latest Ref Rng & Units 12/08/2023    5:15 AM 12/07/2023    7:22 AM 10/06/2014   12:53 PM  CBC  WBC 4.0 - 10.5 K/uL 9.4  11.1  8.8   Hemoglobin 12.0 - 15.0 g/dL 87.7  85.6  85.5   Hematocrit 36.0 - 46.0 % 37.6  42.9  43.6   Platelets 150 - 400 K/uL 251  300  220       Latest Ref Rng & Units 12/08/2023    5:15 AM 12/07/2023    7:22 AM 10/06/2014   12:53 PM  CMP  Glucose 70 - 99 mg/dL 84  874  74   BUN 8 - 23 mg/dL 15  15  14    Creatinine 0.44 - 1.00 mg/dL 9.23  9.07  9.23   Sodium 135 - 145 mmol/L 141  139  139   Potassium 3.5 - 5.1 mmol/L 3.9  4.6  3.8   Chloride 98 - 111 mmol/L 109  104  104   CO2 22 - 32 mmol/L 26  22  29    Calcium 8.9 - 10.3 mg/dL 8.7  9.7  9.5   Total Protein 6.5 - 8.1 g/dL  6.8  7.2   Total Bilirubin 0.0 - 1.2 mg/dL  0.9  0.4   Alkaline Phos 38 - 126 U/L  98  94   AST 15 - 41 U/L  23  23   ALT 0 - 44 U/L  19  14     Imaging studies: No new pertinent imaging studies   Assessment/Plan: (ICD-10's: K62.609) 63 y.o. female with small bowel obstruction, given previous history of gastric  bypass, concern for possible internal hernia vs secondary to adhesive disease    - We will plan for gastrografin  challenge this morning with 4 and 8 hour evaluations with XR. Her presentation and imaging are concerning and would not be surprise if she fails to resolve with conservative measures.  - She understands she may likely need laparotomy this afternoon pending gastrografin  challenge. However, given her surgical history and presenting CT, she is certainly at increased risk of internal hernia. She clinically has no evidence of bowel compromise.   - Continue NPO for now   - Continue NGT decompression; LIS; monitor and record output   - Monitor abdominal examination - Pain control prn; limited do to NPO status - Antiemetics prn; added phenergan  - Okay to mobilize - Further management per primary service; we will follow along    All of the above findings and  recommendations were discussed with the patient, and the medical team, and all of patient's questions were answered to her expressed satisfaction.  -- Arthea Platt, PA-C [facility] 12/08/2023, 8:54 AM M-F: 7am - 4pm

## 2023-12-08 NOTE — Plan of Care (Signed)
   Problem: Education: Goal: Knowledge of General Education information will improve Description: Including pain rating scale, medication(s)/side effects and non-pharmacologic comfort measures Outcome: Progressing   Problem: Clinical Measurements: Goal: Will remain free from infection Outcome: Progressing

## 2023-12-08 NOTE — Progress Notes (Signed)
 Progress Note    Sandra Chung  FMW:[id number] DOB: [birth year]  DOA: 12/07/2023 PCP: Jyl Railing, MD      Brief Narrative:    Medical records reviewed and are as summarized below:  Sandra Chung is a 63 y.o. female with medical history significant of GERD, COPD, multiple prior abdominal surgery including gastric bypass, hysterectomy, cholecystectomy, who presented to the hospital with abdominal pain for about 2 days duration.  She also had nausea and vomiting.  She was admitted to the hospital for small bowel obstruction.       Assessment/Plan:   Principal Problem:   Small bowel obstruction (HCC)    Body mass index is 31.16 kg/m.  (Class I obesity)    Small bowel obstruction: She is NPO.  She has an NG tube in place for gastric decompression.  Continue IV fluids for hydration.  Analgesics as needed for pain.  Follow-up with general surgeon.   COPD: Stable.  Continue bronchodilators.    Diet Order             Diet NPO time specified  Diet effective now                                  Consultants: General Surgeon  Procedures: None    Medications:    budesonide -glycopyrrolate -formoterol   2 puff Inhalation BID   montelukast   10 mg Oral QHS   pantoprazole  (PROTONIX ) IV  40 mg Intravenous Q24H   Continuous Infusions:  lactated ringers        Anti-infectives (From admission, onward)    None              Family Communication/Anticipated D/C date and plan/Code Status   DVT prophylaxis: SCDs Start: 12/07/23 1139     Code Status: Full Code  Family Communication: None Disposition Plan: Plan to discharge home   Status is: Inpatient Remains inpatient appropriate because: Small bowel obstruction       Subjective:   Interval events noted.  She complains of abdominal pain.  Objective:    Vitals:   12/07/23 1716 12/07/23 1929 12/08/23 0435 12/08/23 0808  BP: 129/72 (!) 155/90 (!) 156/83 (!)  176/88  Pulse: 95 93 94 88  Resp: 16  14 18   Temp: 98.1 F (36.7 C) 98.4 F (36.9 C) 98.2 F (36.8 C) 98.7 F (37.1 C)  TempSrc: Oral Oral Oral Oral  SpO2: 90% 95% 94% 96%  Weight:      Height:       No data found.   Intake/Output Summary (Last 24 hours) at 12/08/2023 1056 Last data filed at 12/08/2023 0813 Gross per 24 hour  Intake 1396.57 ml  Output 795 ml  Net 601.57 ml   Filed Weights   12/07/23 0721 12/07/23 1200  Weight: 74.8 kg 74.8 kg    Exam:  GEN: NAD SKIN: Warm and dry EYES: No pallor or icterus ENT: MMM, NG tube in place (connected to wall suction) CV: RRR PULM: CTA B ABD: soft, distended, NT, +BS CNS: AAO x 3, non focal EXT: No edema or tenderness        Data Reviewed:   I have personally reviewed following labs and imaging studies:  Labs: Labs show the following:   Basic Metabolic Panel: Recent Labs  Lab 12/07/23 0722 12/08/23 0515  NA 139 141  K 4.6 3.9  CL 104 109  CO2 22 26  GLUCOSE 125*  84  BUN 15 15  CREATININE 0.92 0.76  CALCIUM 9.7 8.7*   GFR Estimated Creatinine Clearance: 65.7 mL/min (by C-G formula based on SCr of 0.76 mg/dL). Liver Function Tests: Recent Labs  Lab 12/07/23 0722  AST 23  ALT 19  ALKPHOS 98  BILITOT 0.9  PROT 6.8  ALBUMIN  4.0   Recent Labs  Lab 12/07/23 0722  LIPASE 33   No results for input(s): AMMONIA in the last 168 hours. Coagulation profile No results for input(s): INR, PROTIME in the last 168 hours.  CBC: Recent Labs  Lab 12/07/23 0722 12/08/23 0515  WBC 11.1* 9.4  HGB 14.3 12.2  HCT 42.9 37.6  MCV 94.7 97.4  PLT 300 251   Cardiac Enzymes: No results for input(s): CKTOTAL, CKMB, CKMBINDEX, TROPONINI in the last 168 hours. BNP (last 3 results) No results for input(s): PROBNP in the last 8760 hours. CBG: No results for input(s): GLUCAP in the last 168 hours. D-Dimer: No results for input(s): DDIMER in the last 72 hours. Hgb A1c: No results for  input(s): HGBA1C in the last 72 hours. Lipid Profile: No results for input(s): CHOL, HDL, LDLCALC, TRIG, CHOLHDL, LDLDIRECT in the last 72 hours. Thyroid function studies: No results for input(s): TSH, T4TOTAL, T3FREE, THYROIDAB in the last 72 hours.  Invalid input(s): FREET3 Anemia work up: No results for input(s): VITAMINB12, FOLATE, FERRITIN, TIBC, IRON, RETICCTPCT in the last 72 hours. Sepsis Labs: Recent Labs  Lab 12/07/23 0722 12/07/23 1201 12/08/23 0515  WBC 11.1*  --  9.4  LATICACIDVEN  --  0.8  --     Microbiology Recent Results (from the past 240 hours)  Resp panel by RT-PCR (RSV, Flu A&B, Covid) Anterior Nasal Swab     Status: None   Collection Time: 12/07/23  9:05 AM   Specimen: Anterior Nasal Swab  Result Value Ref Range Status   SARS Coronavirus 2 by RT PCR NEGATIVE NEGATIVE Final    Comment: (NOTE) SARS-CoV-2 target nucleic acids are NOT DETECTED.  The SARS-CoV-2 RNA is generally detectable in upper respiratory specimens during the acute phase of infection. The lowest concentration of SARS-CoV-2 viral copies this assay can detect is 138 copies/mL. A negative result does not preclude SARS-Cov-2 infection and should not be used as the sole basis for treatment or other patient management decisions. A negative result may occur with  improper specimen collection/handling, submission of specimen other than nasopharyngeal swab, presence of viral mutation(s) within the areas targeted by this assay, and inadequate number of viral copies(<138 copies/mL). A negative result must be combined with clinical observations, patient history, and epidemiological information. The expected result is Negative.  Fact Sheet for Patients:  BloggerCourse.com  Fact Sheet for Healthcare Providers:  SeriousBroker.it  This test is no t yet approved or cleared by the [place]  FDA and  has been  authorized for detection and/or diagnosis of SARS-CoV-2 by FDA under an Emergency Use Authorization (EUA). This EUA will remain  in effect (meaning this test can be used) for the duration of the COVID-19 declaration under Section 564(b)(1) of the Act, 21 U.S.C.section 360bbb-3(b)(1), unless the authorization is terminated  or revoked sooner.       Influenza A by PCR NEGATIVE NEGATIVE Final   Influenza B by PCR NEGATIVE NEGATIVE Final    Comment: (NOTE) The Xpert Xpress SARS-CoV-2/FLU/RSV plus assay is intended as an aid in the diagnosis of influenza from Nasopharyngeal swab specimens and should not be used as a sole basis for treatment. Nasal  washings and aspirates are unacceptable for Xpert Xpress SARS-CoV-2/FLU/RSV testing.  Fact Sheet for Patients: BloggerCourse.com  Fact Sheet for Healthcare Providers: SeriousBroker.it  This test is not yet approved or cleared by the [place]  FDA and has been authorized for detection and/or diagnosis of SARS-CoV-2 by FDA under an Emergency Use Authorization (EUA). This EUA will remain in effect (meaning this test can be used) for the duration of the COVID-19 declaration under Section 564(b)(1) of the Act, 21 U.S.C. section 360bbb-3(b)(1), unless the authorization is terminated or revoked.     Resp Syncytial Virus by PCR NEGATIVE NEGATIVE Final    Comment: (NOTE) Fact Sheet for Patients: BloggerCourse.com  Fact Sheet for Healthcare Providers: SeriousBroker.it  This test is not yet approved or cleared by the [place]  FDA and has been authorized for detection and/or diagnosis of SARS-CoV-2 by FDA under an Emergency Use Authorization (EUA). This EUA will remain in effect (meaning this test can be used) for the duration of the COVID-19 declaration under Section 564(b)(1) of the Act, 21 U.S.C. section 360bbb-3(b)(1), unless the  authorization is terminated or revoked.  Performed at [facility], [address] [place], [place] [address]     Procedures and diagnostic studies:  DG Abd Portable 1 View Result Date: 12/07/2023 CLINICAL DATA:  Nasogastric tube placement EXAM: PORTABLE ABDOMEN - 1 VIEW COMPARISON:  CT abdomen 12/07/2023 FINDINGS: Nasogastric tube side port is in the hiatal hernia and nasogastric tube tip is in the gastric fundus. Consider advancing 9 cm. Small to moderate-sized hiatal hernia. Dilated upper abdominal small bowel loops persist. Postoperative findings noted along the proximal stomach. IMPRESSION: 1. Nasogastric tube side port is in the hiatal hernia and nasogastric tube tip is in the gastric fundus. Consider advancing 9 cm. 2. Dilated upper abdominal small bowel loops persist. 3. Small to moderate-sized hiatal hernia. Electronically Signed   By: Ryan Salvage M.D.   On: 12/07/2023 10:24   CT ABDOMEN PELVIS W CONTRAST Result Date: 12/07/2023 EXAM: CT ABDOMEN AND PELVIS WITH CONTRAST 12/07/2023 08:43:46 AM TECHNIQUE: CT of the abdomen and pelvis was performed with the administration of intravenous contrast. Multiplanar reformatted images are provided for review. Automated exposure control, iterative reconstruction, and/or weight-based adjustment of the mA/kV was utilized to reduce the radiation dose to as low as reasonably achievable. COMPARISON: None available. CLINICAL HISTORY: RLQ abdominal pain. FINDINGS: LOWER CHEST: Moderate-sized hiatal hernia identified. LIVER: Too small to characterize low-density structures within the inferior right lobe of liver, the largest measuring 8 mm (image 32/2). Simple-appearing liver cysts are noted within segments 4b and 8, the largest measuring 3.1 cm. GALLBLADDER AND BILE DUCTS: Increased caliber of the common bile duct measures 1.2 cm. No calcified stones identified along the course of the CBD. SPLEEN: No acute abnormality. PANCREAS: No acute  abnormality. ADRENAL GLANDS: No acute abnormality. KIDNEYS, URETERS AND BLADDER: No stones in the kidneys or ureters. No hydronephrosis. No perinephric or periureteral stranding. Urinary bladder is unremarkable. GI AND BOWEL: Postoperative changes from previous gastric bypass surgery. There is abnormal dilatation of the proximal and mid small bowel loops with multiple air-fluid levels, measuring up to 4.6 cm in diameter. Transition point to decreased caliber distal small bowel is in the right lower quadrant of the abdomen (image 51/2) where there is focal twisting of the bowel. Just beyond the transition point, the proximal nondilated small bowel exhibits mild wall thickening measuring up to 5 mm. No pneumatosis identified. No abnormal dilatation of the colon. PERITONEUM AND RETROPERITONEUM: Small volume  of free fluid is noted within the mesentery, around the inferior margin of the right lobe of liver, and right hemipelvis. No loculated fluid collections identified. No signs of pneumoperitoneum. VASCULATURE: The mesenteric vasculature appears patent. LYMPH NODES: No abdominal or pelvic adenopathy. REPRODUCTIVE ORGANS: Status post hysterectomy. No mass identified within either adnexa. BONES AND SOFT TISSUES: No acute osseous abnormality. No focal soft tissue abnormality. IMPRESSION: 1. The patient is status post gastric bypass with signs of high-grade small bowel obstruction The transition point is in the ventral aspect of the right lower quadrant of the abdomen where there is twisting of the bowel loops. Distal to the transition point, there is mild wall thickening of the small bowel with mild signs of pneumatosis. 2. Venous congestion with fluid and edema seen within the small bowel mesentery. 3. Free fluid noted within the abdomen and pelvis. No signs of abscess or pneumoperitoneum. 4. Moderate-sized hiatal hernia. 5. Increased caliber of the common bile duct measuring 1.2 cm. No calcified stones identified along  the course of the CBD. Electronically signed by: Waddell Calk MD 12/07/2023 09:06 AM EDT RP Workstation: [id number]               LOS: 1 day   Garren Greenman  [facility]   Pager on [email or web address]. If 7PM-7AM, please contact night-coverage at [email or web address]     12/08/2023, 10:56 AM

## 2023-12-09 ENCOUNTER — Inpatient Hospital Stay

## 2023-12-09 ENCOUNTER — Inpatient Hospital Stay: Admitting: Anesthesiology

## 2023-12-09 ENCOUNTER — Other Ambulatory Visit: Payer: Self-pay

## 2023-12-09 ENCOUNTER — Encounter
Admission: EM | Disposition: A | Discharge status: Home / Self Care | Payer: Self-pay | Source: Home / Self Care | Attending: Family Medicine

## 2023-12-09 DIAGNOSIS — K631 Perforation of intestine (nontraumatic): Secondary | ICD-10-CM | POA: Diagnosis not present

## 2023-12-09 DIAGNOSIS — K56609 Unspecified intestinal obstruction, unspecified as to partial versus complete obstruction: Secondary | ICD-10-CM | POA: Diagnosis not present

## 2023-12-09 HISTORY — PX: BOWEL RESECTION: SHX1257

## 2023-12-09 HISTORY — PX: REPAIR OF PERFORATED ULCER: SHX6065

## 2023-12-09 HISTORY — PX: LAPAROTOMY: SHX154

## 2023-12-09 LAB — TYPE AND SCREEN
ABO/RH(D): A POS
Antibody Screen: NEGATIVE

## 2023-12-09 LAB — BASIC METABOLIC PANEL WITH GFR
Anion gap: 13 (ref 5–15)
BUN: 18 mg/dL (ref 8–23)
CO2: 24 mmol/L (ref 22–32)
Calcium: 9.1 mg/dL (ref 8.9–10.3)
Chloride: 105 mmol/L (ref 98–111)
Creatinine, Ser: 0.9 mg/dL (ref 0.44–1.00)
GFR, Estimated: >60 mL/min (ref >60)
Glucose, Bld: 116 mg/dL — ABNORMAL HIGH (ref 70–99)
Potassium: 4.2 mmol/L (ref 3.5–5.1)
Sodium: 142 mmol/L (ref 135–145)

## 2023-12-09 LAB — MAGNESIUM: Magnesium: 1.7 mg/dL (ref 1.7–2.4)

## 2023-12-09 LAB — PHOSPHORUS: Phosphorus: 2.9 mg/dL (ref 2.5–4.6)

## 2023-12-09 LAB — ABO/RH: ABO/RH(D): A POS

## 2023-12-09 SURGERY — LAPAROTOMY, EXPLORATORY
Anesthesia: General | Site: Abdomen

## 2023-12-09 MED ORDER — LACTATED RINGERS IV SOLN: INTRAVENOUS | Status: DC

## 2023-12-09 MED ORDER — PROPOFOL 10 MG/ML IV BOLUS
INTRAVENOUS | Status: DC | PRN
  Administered 2023-12-09: 150 mg via INTRAVENOUS
  Administered 2023-12-09: 150 ug/kg/min via INTRAVENOUS

## 2023-12-09 MED ORDER — SEVOFLURANE IN SOLN
RESPIRATORY_TRACT | Status: AC
  Filled 2023-12-09: qty 250

## 2023-12-09 MED ORDER — LIDOCAINE HCL 4 % EX SOLN
CUTANEOUS | Status: DC | PRN
  Administered 2023-12-09: 5 mL via TOPICAL

## 2023-12-09 MED ORDER — SODIUM CHLORIDE (PF) 0.9 % IJ SOLN
INTRAMUSCULAR | Status: AC
  Filled 2023-12-09: qty 50

## 2023-12-09 MED ORDER — SEPRAFILM FOR OPTIME
ORAL_FILM | TOPICAL | Status: DC | PRN
Start: 2023-12-09 — End: 2023-12-09
  Administered 2023-12-09: 4 via TOPICAL

## 2023-12-09 MED ORDER — PIPERACILLIN-TAZOBACTAM 3.375 G IVPB
INTRAVENOUS | Status: AC
  Filled 2023-12-09: qty 50

## 2023-12-09 MED ORDER — IOHEXOL 9 MG/ML PO SOLN
500.0000 mL | ORAL | Status: AC
Start: 2023-12-09 — End: 2023-12-09

## 2023-12-09 MED ORDER — GLYCOPYRROLATE 0.2 MG/ML IJ SOLN
INTRAMUSCULAR | Status: DC | PRN
  Administered 2023-12-09: .2 mg via INTRAVENOUS

## 2023-12-09 MED ORDER — SUGAMMADEX SODIUM 200 MG/2ML IV SOLN
INTRAVENOUS | Status: DC | PRN
  Administered 2023-12-09: 200 mg via INTRAVENOUS

## 2023-12-09 MED ORDER — ONDANSETRON HCL 4 MG/2ML IJ SOLN
INTRAMUSCULAR | Status: DC | PRN
  Administered 2023-12-09: 4 mg via INTRAVENOUS

## 2023-12-09 MED ORDER — ROCURONIUM BROMIDE 100 MG/10ML IV SOLN
INTRAVENOUS | Status: DC | PRN
  Administered 2023-12-09: 20 mg via INTRAVENOUS
  Administered 2023-12-09: 50 mg via INTRAVENOUS

## 2023-12-09 MED ORDER — METHYLENE BLUE (ANTIDOTE) 1 % IV SOLN
INTRAVENOUS | Status: AC
  Filled 2023-12-09: qty 10

## 2023-12-09 MED ORDER — DEXMEDETOMIDINE HCL IN NACL 80 MCG/20ML IV SOLN
INTRAVENOUS | Status: DC | PRN
  Administered 2023-12-09: 8 ug via INTRAVENOUS
  Administered 2023-12-09: 12 ug via INTRAVENOUS

## 2023-12-09 MED ORDER — IRRISEPT - 450ML BOTTLE WITH 0.05% CHG IN STERILE WATER, USP 99.95% OPTIME
TOPICAL | Status: DC | PRN
  Administered 2023-12-09: 450 mL via TOPICAL

## 2023-12-09 MED ORDER — HYDROMORPHONE HCL 1 MG/ML IJ SOLN: 0.2500 mg | INTRAMUSCULAR | Status: DC | PRN

## 2023-12-09 MED ORDER — LIDOCAINE HCL (PF) 2 % IJ SOLN
INTRAMUSCULAR | Status: AC
  Filled 2023-12-09: qty 5

## 2023-12-09 MED ORDER — IOHEXOL 300 MG/ML  SOLN
100.0000 mL | Freq: Once | INTRAMUSCULAR | Status: AC | PRN
  Administered 2023-12-09: 100 mL via INTRAVENOUS

## 2023-12-09 MED ORDER — SODIUM CHLORIDE 0.9 % IV SOLN
2.0000 g | Freq: Once | INTRAVENOUS | Status: AC
  Administered 2023-12-09: 2 g via INTRAVENOUS
  Filled 2023-12-09: qty 2

## 2023-12-09 MED ORDER — SODIUM CHLORIDE 0.9 % IV SOLN
12.5000 mg | Freq: Four times a day (QID) | INTRAVENOUS | Status: DC | PRN
  Administered 2023-12-09: 12.5 mg via INTRAVENOUS
  Filled 2023-12-09: qty 0.5
  Filled 2023-12-09: qty 12.5

## 2023-12-09 MED ORDER — SCOPOLAMINE 1 MG/3DAYS TD PT72
MEDICATED_PATCH | TRANSDERMAL | Status: AC
  Filled 2023-12-09: qty 1

## 2023-12-09 MED ORDER — PROPOFOL 1000 MG/100ML IV EMUL
INTRAVENOUS | Status: AC
  Filled 2023-12-09: qty 100

## 2023-12-09 MED ORDER — DROPERIDOL 2.5 MG/ML IJ SOLN: 0.6250 mg | Freq: Once | INTRAMUSCULAR | Status: DC | PRN

## 2023-12-09 MED ORDER — DEXAMETHASONE SODIUM PHOSPHATE 10 MG/ML IJ SOLN
INTRAMUSCULAR | Status: AC
  Filled 2023-12-09: qty 1

## 2023-12-09 MED ORDER — ALBUMIN HUMAN 5 % IV SOLN: INTRAVENOUS | Status: DC | PRN

## 2023-12-09 MED ORDER — LACTATED RINGERS IV SOLN: INTRAVENOUS | Status: DC | PRN

## 2023-12-09 MED ORDER — DEXAMETHASONE SODIUM PHOSPHATE 10 MG/ML IJ SOLN
INTRAMUSCULAR | Status: DC | PRN
  Administered 2023-12-09: 10 mg via INTRAVENOUS

## 2023-12-09 MED ORDER — DEXAMETHASONE SODIUM PHOSPHATE 10 MG/ML IJ SOLN
INTRAMUSCULAR | Status: AC
Start: 2023-12-09 — End: 2023-12-09
  Filled 2023-12-09: qty 1

## 2023-12-09 MED ORDER — BUPIVACAINE-EPINEPHRINE (PF) 0.25% -1:200000 IJ SOLN
INTRAMUSCULAR | Status: AC
  Filled 2023-12-09: qty 30

## 2023-12-09 MED ORDER — ACETAMINOPHEN 10 MG/ML IV SOLN: 1000.0000 mg | Freq: Once | INTRAVENOUS | Status: DC | PRN

## 2023-12-09 MED ORDER — SUCCINYLCHOLINE CHLORIDE 200 MG/10ML IV SOSY
PREFILLED_SYRINGE | INTRAVENOUS | Status: AC
  Filled 2023-12-09: qty 10

## 2023-12-09 MED ORDER — LIDOCAINE HCL URETHRAL/MUCOSAL 2 % EX GEL
CUTANEOUS | Status: AC
  Filled 2023-12-09: qty 5

## 2023-12-09 MED ORDER — 0.9 % SODIUM CHLORIDE (POUR BTL) OPTIME
TOPICAL | Status: DC | PRN
  Administered 2023-12-09: 2000 mL

## 2023-12-09 MED ORDER — PIPERACILLIN-TAZOBACTAM 3.375 G IVPB 30 MIN
3.3750 g | Freq: Once | INTRAVENOUS | Status: AC
  Administered 2023-12-09: 3.375 g via INTRAVENOUS

## 2023-12-09 MED ORDER — PROPOFOL 500 MG/50ML IV EMUL: INTRAVENOUS | Status: DC | PRN

## 2023-12-09 MED ORDER — SODIUM CHLORIDE (PF) 0.9 % IJ SOLN
INTRAMUSCULAR | Status: DC | PRN
  Administered 2023-12-09: 100 mL via SURGICAL_CAVITY

## 2023-12-09 MED ORDER — FENTANYL CITRATE (PF) 100 MCG/2ML IJ SOLN
INTRAMUSCULAR | Status: AC
  Filled 2023-12-09: qty 2

## 2023-12-09 MED ORDER — PANTOPRAZOLE SODIUM 40 MG IV SOLR
80.0000 mg | Freq: Two times a day (BID) | INTRAVENOUS | Status: DC
  Administered 2023-12-09 – 2023-12-20 (×22): 80 mg via INTRAVENOUS
  Filled 2023-12-09 (×23): qty 20

## 2023-12-09 MED ORDER — PIPERACILLIN-TAZOBACTAM 3.375 G IVPB
3.3750 g | Freq: Three times a day (TID) | INTRAVENOUS | Status: DC
  Administered 2023-12-09 – 2023-12-20 (×32): 3.375 g via INTRAVENOUS
  Filled 2023-12-09 (×31): qty 50

## 2023-12-09 MED ORDER — PHENYLEPHRINE 80 MCG/ML (10ML) SYRINGE FOR IV PUSH (FOR BLOOD PRESSURE SUPPORT)
PREFILLED_SYRINGE | INTRAVENOUS | Status: DC | PRN
  Administered 2023-12-09 (×4): 80 ug via INTRAVENOUS

## 2023-12-09 MED ORDER — SUCCINYLCHOLINE CHLORIDE 200 MG/10ML IV SOSY
PREFILLED_SYRINGE | INTRAVENOUS | Status: DC | PRN
  Administered 2023-12-09: 100 mg via INTRAVENOUS

## 2023-12-09 MED ORDER — ACETAMINOPHEN 10 MG/ML IV SOLN
1000.0000 mg | Freq: Four times a day (QID) | INTRAVENOUS | Status: AC
  Administered 2023-12-09 – 2023-12-10 (×4): 1000 mg via INTRAVENOUS
  Filled 2023-12-09 (×4): qty 100

## 2023-12-09 MED ORDER — PHENYLEPHRINE HCL-NACL 20-0.9 MG/250ML-% IV SOLN
INTRAVENOUS | Status: DC | PRN
  Administered 2023-12-09: 50 ug/min via INTRAVENOUS
  Administered 2023-12-09: 160 ug via INTRAVENOUS

## 2023-12-09 MED ORDER — FENTANYL CITRATE (PF) 100 MCG/2ML IJ SOLN
INTRAMUSCULAR | Status: DC | PRN
  Administered 2023-12-09: 50 ug via INTRAVENOUS
  Administered 2023-12-09: 25 ug via INTRAVENOUS
  Administered 2023-12-09 (×2): 50 ug via INTRAVENOUS
  Administered 2023-12-09: 25 ug via INTRAVENOUS

## 2023-12-09 MED ORDER — ACETAMINOPHEN 10 MG/ML IV SOLN
INTRAVENOUS | Status: AC
  Filled 2023-12-09: qty 100

## 2023-12-09 MED ORDER — PROPOFOL 10 MG/ML IV BOLUS
INTRAVENOUS | Status: AC
  Filled 2023-12-09: qty 20

## 2023-12-09 MED ORDER — ACETAMINOPHEN 10 MG/ML IV SOLN
INTRAVENOUS | Status: DC | PRN
  Administered 2023-12-09: 1000 mg via INTRAVENOUS

## 2023-12-09 MED ORDER — ONDANSETRON HCL 4 MG/2ML IJ SOLN
INTRAMUSCULAR | Status: AC
  Filled 2023-12-09: qty 2

## 2023-12-09 MED ORDER — SCOPOLAMINE 1 MG/3DAYS TD PT72
1.0000 | MEDICATED_PATCH | TRANSDERMAL | Status: DC
  Administered 2023-12-09 – 2023-12-18 (×4): 1 mg via TRANSDERMAL
  Filled 2023-12-09 (×3): qty 1

## 2023-12-09 MED ORDER — MIDAZOLAM HCL 2 MG/2ML IJ SOLN
INTRAMUSCULAR | Status: AC
  Filled 2023-12-09: qty 2

## 2023-12-09 SURGICAL SUPPLY — 45 items
BARRIER ADH SEPRAFILM 3INX5IN (MISCELLANEOUS) IMPLANT
BINDER ABDOMINAL 12 ML 46-62 (SOFTGOODS) IMPLANT
BLADE CLIPPER SURG (BLADE) ×1 IMPLANT
CATH ROBINSON RED A/P 18FR (CATHETERS) IMPLANT
CHLORAPREP W/TINT 26 (MISCELLANEOUS) ×1 IMPLANT
CLIP APPLIE 11 MED OPEN (CLIP) IMPLANT
CLIP APPLIE 13 LRG OPEN (CLIP) IMPLANT
DRAIN CHANNEL JP 19F RND 3/16 (MISCELLANEOUS) IMPLANT
DRAPE LAPAROTOMY 100X77 ABD (DRAPES) ×1 IMPLANT
DRAPE TABLE BACK 80X90 (DRAPES) IMPLANT
DRAPE WARM FLUID 44X44 (DRAPES) IMPLANT
DRSG OPSITE POSTOP 4X10 (GAUZE/BANDAGES/DRESSINGS) IMPLANT
DRSG OPSITE POSTOP 4X12 (GAUZE/BANDAGES/DRESSINGS) IMPLANT
DRSG OPSITE POSTOP 4X14 (GAUZE/BANDAGES/DRESSINGS) IMPLANT
DRSG TEGADERM 4X4.75 (GAUZE/BANDAGES/DRESSINGS) IMPLANT
ELECT BLADE 6.5 EXT (BLADE) ×1 IMPLANT
ELECTRODE REM PT RTRN 9FT ADLT (ELECTROSURGICAL) ×1 IMPLANT
EVACUATOR SILICONE 100CC (DRAIN) IMPLANT
GLOVE BIO SURGEON STRL SZ7 (GLOVE) ×2 IMPLANT
GOWN STRL REUS W/ TWL LRG LVL3 (GOWN DISPOSABLE) ×2 IMPLANT
IV 0.9% NACL 1000 ML (IV SOLUTION) IMPLANT
LAVAGE JET IRRISEPT WOUND (IRRIGATION / IRRIGATOR) IMPLANT
LIGASURE IMPACT 36 18CM CVD LR (INSTRUMENTS) IMPLANT
MANIFOLD NEPTUNE II (INSTRUMENTS) ×1 IMPLANT
NDL HYPO 22X1.5 SAFETY MO (MISCELLANEOUS) ×2 IMPLANT
NEEDLE HYPO 22X1.5 SAFETY MO (MISCELLANEOUS) ×2 IMPLANT
PACK BASIN MAJOR ARMC (MISCELLANEOUS) ×1 IMPLANT
RELOAD STAPLE 75 3.8 BLU REG (ENDOMECHANICALS) IMPLANT
SPONGE T-LAP 18X18 ~~LOC~~+RFID (SPONGE) ×1 IMPLANT
SPONGE T-LAP 18X36 ~~LOC~~+RFID STR (SPONGE) IMPLANT
STAPLER PROXIMATE 75MM BLUE (STAPLE) IMPLANT
STAPLER SKIN PROX 35W (STAPLE) ×1 IMPLANT
SUT PDS AB 0 CT1 27 (SUTURE) ×3 IMPLANT
SUT SILK 2 0 SH CR/8 (SUTURE) ×1 IMPLANT
SUT SILK 2 0SH CR/8 30 (SUTURE) ×1 IMPLANT
SUT SILK 2-0 18XBRD TIE 12 (SUTURE) ×1 IMPLANT
SUT STRATA 3-0 SH (SUTURE) IMPLANT
SUT VIC AB 0 CT1 36 (SUTURE) ×2 IMPLANT
SUT VIC AB 2-0 SH 27XBRD (SUTURE) IMPLANT
SUT VIC AB 3-0 SH 27X BRD (SUTURE) ×1 IMPLANT
SYR 20ML LL LF (SYRINGE) ×2 IMPLANT
SYR 3ML LL SCALE MARK (SYRINGE) ×1 IMPLANT
TRAP FLUID SMOKE EVACUATOR (MISCELLANEOUS) ×1 IMPLANT
TRAY FOLEY MTR SLVR 16FR STAT (SET/KITS/TRAYS/PACK) ×1 IMPLANT
WATER STERILE IRR 500ML POUR (IV SOLUTION) ×1 IMPLANT

## 2023-12-09 NOTE — Anesthesia Procedure Notes (Signed)
 Procedure Name: Intubation Date/Time: 12/09/2023 2:09 PM  Performed by: Vicci Camellia Glatter, MDPre-anesthesia Checklist: Patient identified, Emergency Drugs available, Suction available and Patient being monitored Patient Re-evaluated:Patient Re-evaluated prior to induction Oxygen Delivery Method: Circle system utilized Preoxygenation: Pre-oxygenation with 100% oxygen Induction Type: IV induction and Rapid sequence Laryngoscope Size: McGrath and 3 Grade View: Grade I Tube type: Oral Tube size: 7.0 mm Number of attempts: 1 Airway Equipment and Method: Stylet and Video-laryngoscopy Placement Confirmation: ETT inserted through vocal cords under direct vision, positive ETCO2 and breath sounds checked- equal and bilateral Secured at: 21 cm Tube secured with: Tape Dental Injury: Teeth and Oropharynx as per pre-operative assessment  Comments: Attempt x3 for awake intubation. 2% lidocaine  nebulized in preop and 4% lidocaine  gargled in OR prior to intubation attempts. Pt was not able to tolerate awake intubation. Risk of moderately sedated intubation vs. RSI considered. Due to pt conditions and preoperative status, RSI completed without complication.

## 2023-12-09 NOTE — Transfer of Care (Signed)
 Immediate Anesthesia Transfer of Care Note  Patient: Sandra Chung  Procedure(s) Performed: LAPAROTOMY, EXPLORATORY (Abdomen) EXCISION, SMALL INTESTINE (Abdomen) REPAIR, ULCER, PEPTIC, PERFORATED (Abdomen)  Patient Location: PACU  Anesthesia Type:General  Level of Consciousness: awake, alert , oriented, and patient cooperative  Airway & Oxygen Therapy: Patient Spontanous Breathing and Patient connected to face mask oxygen  Post-op Assessment: Report given to RN and Post -op Vital signs reviewed and stable  Post vital signs: stable  Last Vitals:  Vitals Value Taken Time  BP    Temp    Pulse    Resp    SpO2      Last Pain:  Vitals:   12/09/23 1303  TempSrc: Temporal  PainSc: 7          Complications: No notable events documented.

## 2023-12-09 NOTE — Progress Notes (Addendum)
 1:00 PM  CT Abdomen/Pelvis reviewed with persistent evidence of high grade bowel obstruction. Additionally, NGT appears to have perforated anterior wall of jejunum. Discussed with radiology on the phone as well.   We will plan for exploratory laparotomy.  All risks, benefits, and alternatives to above procedure(s) were discussed with the patient, all of her questions were answered to her expressed satisfaction, patient expresses she wishes to proceed, and informed consent was obtained.     [facility] SURGICAL PROGRESS NOTE (cpt [phone])  Hospital Day(s): 2.   Interval History: Patient seen and examined, no acute events or new complaints overnight. Patient reports she continues to have central abdominal pain and nausea. This remains similar to yesterday. Renal function remains normal; sCr - 0.90; UO - 200 ccs + unmeasured x4. No electrolyte derangements. NGT with 700 ccs recorded. She did get gastrografin  yesterday and this was seen in the colon at 4 and 8 hours. She is having bowel movements.   Review of Systems:  Constitutional: denies fever, chills  HEENT: denies cough or congestion  Respiratory: denies any shortness of breath  Cardiovascular: denies chest pain or palpitations  Gastrointestinal: + abdominal pain, + nausea  Genitourinary: denies burning with urination or urinary frequency Musculoskeletal: denies pain, decreased motor or sensation  Vital signs in last 24 hours: [min-max] current  Temp:  [97.8 F (36.6 C)-98.7 F (37.1 C)] 98.2 F (36.8 C) (09/16 0440) Pulse Rate:  [85-106] 103 (09/16 0440) Resp:  [16-18] 16 (09/16 0440) BP: (143-176)/(64-93) 151/64 (09/16 0440) SpO2:  [93 %-96 %] 93 % (09/16 0440)     Height: 5' 1 (154.9 cm) Weight: 74.8 kg BMI (Calculated): 31.17   Intake/Output last 2 shifts:  09/15 0701 - 09/16 0700 In: 1050 [I.V.:900; NG/GT:150] Out: 900 [Urine:200; Emesis/NG output:700]   Physical Exam:  Constitutional: alert,  cooperative and no distress  HENT: normocephalic without obvious abnormality; NGT in place, clamped at time of evaluation  Eyes: PERRL, EOM's grossly intact and symmetric  Respiratory: breathing non-labored at rest  Cardiovascular: regular rate and sinus rhythm  Gastrointestinal: Soft, tenderness worse centrally, she does not appear overtly distended, no rebound/guarding. She is not peritonitic  Musculoskeletal:  no edema or wounds, motor and sensation grossly intact, NT    Labs:     Latest Ref Rng & Units 12/08/2023    5:15 AM 12/07/2023    7:22 AM 10/06/2014   12:53 PM  CBC  WBC 4.0 - 10.5 K/uL 9.4  11.1  8.8   Hemoglobin 12.0 - 15.0 g/dL 87.7  85.6  85.5   Hematocrit 36.0 - 46.0 % 37.6  42.9  43.6   Platelets 150 - 400 K/uL 251  300  220       Latest Ref Rng & Units 12/09/2023    5:25 AM 12/08/2023    5:15 AM 12/07/2023    7:22 AM  CMP  Glucose 70 - 99 mg/dL 883  84  874   BUN 8 - 23 mg/dL 18  15  15    Creatinine 0.44 - 1.00 mg/dL 9.09  9.23  9.07   Sodium 135 - 145 mmol/L 142  141  139   Potassium 3.5 - 5.1 mmol/L 4.2  3.9  4.6   Chloride 98 - 111 mmol/L 105  109  104   CO2 22 - 32 mmol/L 24  26  22    Calcium 8.9 - 10.3 mg/dL 9.1  8.7  9.7   Total Protein 6.5 - 8.1 g/dL  6.8   Total Bilirubin 0.0 - 1.2 mg/dL   0.9   Alkaline Phos 38 - 126 U/L   98   AST 15 - 41 U/L   23   ALT 0 - 44 U/L   19     Imaging studies:   KUB (12/09/2023) personally reviewed still with contrast in colon, she continues to have air fluid levels on upright films and distended small bowel, no free air, and radiologist report reviewed below:  IMPRESSION: 1. Multiple dilated loops of small bowel within the central abdomen, consistent with small bowel obstruction.   Assessment/Plan: (ICD-10's: K69.609) 63 y.o. female with small bowel obstruction, given previous history of gastric bypass, concern for possible internal hernia vs secondary to adhesive disease    - Although she technically passed  gastrografin  challenge and has had bowel movements, her KUB continues to have an obstructive pattern and she continues to have pain on exam. I am concerned she may still have partially obstructing process (ie: internal hernia) given her presenting CT findings and surgical history. We will plan to repeat CT Abdomen/Pelvis to reassess intra-abdominal process. If this remains concerning, despite passing gastrografin , she understands we may consider exploration this afternoon.    - Continue NPO for now   - Continue NGT decompression; LIS; monitor and record output   - Monitor abdominal examination - Pain control prn; limited do to NPO status - Antiemetics prn; added phenergan  - Okay to mobilize - Further management per primary service; we will follow along    All of the above findings and recommendations were discussed with the patient, and the medical team, and all of patient's questions were answered to her expressed satisfaction.  -- Arthea Platt, PA-C [facility] 12/09/2023, 7:22 AM M-F: 7am - 4pm

## 2023-12-09 NOTE — Progress Notes (Signed)
 Progress Note    Sandra Chung  FMW:[id number] DOB: [birth year]  DOA: 12/07/2023 PCP: Jyl Railing, MD      Brief Narrative:    Medical records reviewed and are as summarized below:  Sandra Chung is a 63 y.o. female with medical history significant of GERD, COPD, multiple prior abdominal surgery including gastric bypass, hysterectomy, cholecystectomy, who presented to the hospital with abdominal pain for about 2 days duration.  She also had nausea and vomiting.  She was admitted to the hospital for small bowel obstruction.       Assessment/Plan:   Principal Problem:   Small bowel obstruction (HCC)    Body mass index is 31.16 kg/m.  (Class I obesity)    Small bowel obstruction: She remains NPO.  Continue IV fluids for hydration.  Analgesics as needed for pain.  She has an NG tube for gastric decompression.  General surgeon is planning to take patient to the OR today for surgical intervention.   COPD: Stable.  Continue bronchodilators.   Elevated BP: This may be due to pain/acute illness.  Continue to monitor.   Diet Order             Diet NPO time specified  Diet effective now                                  Consultants: General Surgeon  Procedures: None    Medications:    budesonide -glycopyrrolate -formoterol   2 puff Inhalation BID   iohexol   500 mL Oral Q1H   montelukast   10 mg Oral QHS   pantoprazole  (PROTONIX ) IV  40 mg Intravenous Q24H   Continuous Infusions:  promethazine  (PHENERGAN ) injection (IM or IVPB) 12.5 mg (12/09/23 1050)     Anti-infectives (From admission, onward)    None              Family Communication/Anticipated D/C date and plan/Code Status   DVT prophylaxis: SCDs Start: 12/07/23 1139     Code Status: Full Code  Family Communication: None Disposition Plan: Plan to discharge home   Status is: Inpatient Remains inpatient appropriate because: Small bowel  obstruction       Subjective:   Interval events noted.  She complains of abdominal pain, vomiting and nausea.  She moved her bowels last night and this morning.  Objective:    Vitals:   12/08/23 1801 12/08/23 2033 12/09/23 0440 12/09/23 0924  BP: (!) 143/76 (!) 158/78 (!) 151/64 (!) 159/90  Pulse: 85 (!) 104 (!) 103 99  Resp:  16 16 16   Temp:  97.8 F (36.6 C) 98.2 F (36.8 C) 98.3 F (36.8 C)  TempSrc:    Oral  SpO2:  95% 93% 93%  Weight:      Height:       No data found.   Intake/Output Summary (Last 24 hours) at 12/09/2023 1230 Last data filed at 12/09/2023 1100 Gross per 24 hour  Intake 1230 ml  Output 1000 ml  Net 230 ml   Filed Weights   12/07/23 0721 12/07/23 1200  Weight: 74.8 kg 74.8 kg    Exam:  GEN: NAD SKIN: Warm and dry EYES: No pallor or icterus ENT: MMM, NG tube in place CV: RRR PULM: CTA B ABD: soft, distended, mid abdominal tenderness, no rebound tenderness or guarding, +BS CNS: AAO x 3, non focal EXT: No edema or tenderness  Data Reviewed:   I have personally reviewed following labs and imaging studies:  Labs: Labs show the following:   Basic Metabolic Panel: Recent Labs  Lab 12/07/23 0722 12/08/23 0515 12/09/23 0525  NA 139 141 142  K 4.6 3.9 4.2  CL 104 109 105  CO2 22 26 24   GLUCOSE 125* 84 116*  BUN 15 15 18   CREATININE 0.92 0.76 0.90  CALCIUM 9.7 8.7* 9.1  MG  --   --  1.7  PHOS  --   --  2.9   GFR Estimated Creatinine Clearance: 58.4 mL/min (by C-G formula based on SCr of 0.9 mg/dL). Liver Function Tests: Recent Labs  Lab 12/07/23 0722  AST 23  ALT 19  ALKPHOS 98  BILITOT 0.9  PROT 6.8  ALBUMIN  4.0   Recent Labs  Lab 12/07/23 0722  LIPASE 33   No results for input(s): AMMONIA in the last 168 hours. Coagulation profile No results for input(s): INR, PROTIME in the last 168 hours.  CBC: Recent Labs  Lab 12/07/23 0722 12/08/23 0515  WBC 11.1* 9.4  HGB 14.3 12.2  HCT 42.9  37.6  MCV 94.7 97.4  PLT 300 251   Cardiac Enzymes: No results for input(s): CKTOTAL, CKMB, CKMBINDEX, TROPONINI in the last 168 hours. BNP (last 3 results) No results for input(s): PROBNP in the last 8760 hours. CBG: No results for input(s): GLUCAP in the last 168 hours. D-Dimer: No results for input(s): DDIMER in the last 72 hours. Hgb A1c: No results for input(s): HGBA1C in the last 72 hours. Lipid Profile: No results for input(s): CHOL, HDL, LDLCALC, TRIG, CHOLHDL, LDLDIRECT in the last 72 hours. Thyroid function studies: No results for input(s): TSH, T4TOTAL, T3FREE, THYROIDAB in the last 72 hours.  Invalid input(s): FREET3 Anemia work up: No results for input(s): VITAMINB12, FOLATE, FERRITIN, TIBC, IRON, RETICCTPCT in the last 72 hours. Sepsis Labs: Recent Labs  Lab 12/07/23 0722 12/07/23 1201 12/08/23 0515  WBC 11.1*  --  9.4  LATICACIDVEN  --  0.8  --     Microbiology Recent Results (from the past 240 hours)  Resp panel by RT-PCR (RSV, Flu A&B, Covid) Anterior Nasal Swab     Status: None   Collection Time: 12/07/23  9:05 AM   Specimen: Anterior Nasal Swab  Result Value Ref Range Status   SARS Coronavirus 2 by RT PCR NEGATIVE NEGATIVE Final    Comment: (NOTE) SARS-CoV-2 target nucleic acids are NOT DETECTED.  The SARS-CoV-2 RNA is generally detectable in upper respiratory specimens during the acute phase of infection. The lowest concentration of SARS-CoV-2 viral copies this assay can detect is 138 copies/mL. A negative result does not preclude SARS-Cov-2 infection and should not be used as the sole basis for treatment or other patient management decisions. A negative result may occur with  improper specimen collection/handling, submission of specimen other than nasopharyngeal swab, presence of viral mutation(s) within the areas targeted by this assay, and inadequate number of viral copies(<138 copies/mL). A  negative result must be combined with clinical observations, patient history, and epidemiological information. The expected result is Negative.  Fact Sheet for Patients:  BloggerCourse.com  Fact Sheet for Healthcare Providers:  SeriousBroker.it  This test is no t yet approved or cleared by the [place]  FDA and  has been authorized for detection and/or diagnosis of SARS-CoV-2 by FDA under an Emergency Use Authorization (EUA). This EUA will remain  in effect (meaning this test can be used) for the duration of the  COVID-19 declaration under Section 564(b)(1) of the Act, 21 U.S.C.section 360bbb-3(b)(1), unless the authorization is terminated  or revoked sooner.       Influenza A by PCR NEGATIVE NEGATIVE Final   Influenza B by PCR NEGATIVE NEGATIVE Final    Comment: (NOTE) The Xpert Xpress SARS-CoV-2/FLU/RSV plus assay is intended as an aid in the diagnosis of influenza from Nasopharyngeal swab specimens and should not be used as a sole basis for treatment. Nasal washings and aspirates are unacceptable for Xpert Xpress SARS-CoV-2/FLU/RSV testing.  Fact Sheet for Patients: BloggerCourse.com  Fact Sheet for Healthcare Providers: SeriousBroker.it  This test is not yet approved or cleared by the [place]  FDA and has been authorized for detection and/or diagnosis of SARS-CoV-2 by FDA under an Emergency Use Authorization (EUA). This EUA will remain in effect (meaning this test can be used) for the duration of the COVID-19 declaration under Section 564(b)(1) of the Act, 21 U.S.C. section 360bbb-3(b)(1), unless the authorization is terminated or revoked.     Resp Syncytial Virus by PCR NEGATIVE NEGATIVE Final    Comment: (NOTE) Fact Sheet for Patients: BloggerCourse.com  Fact Sheet for Healthcare  Providers: SeriousBroker.it  This test is not yet approved or cleared by the [place]  FDA and has been authorized for detection and/or diagnosis of SARS-CoV-2 by FDA under an Emergency Use Authorization (EUA). This EUA will remain in effect (meaning this test can be used) for the duration of the COVID-19 declaration under Section 564(b)(1) of the Act, 21 U.S.C. section 360bbb-3(b)(1), unless the authorization is terminated or revoked.  Performed at [facility], [address] [place], [place] [address]     Procedures and diagnostic studies:  DG ABD ACUTE 2+V W 1V CHEST Result Date: 12/09/2023 EXAM: UPRIGHT AND SUPINE XRAY VIEWS OF THE ABDOMEN AND 4 VIEW(S) OF THE CHEST 12/09/2023 07:57:19 AM COMPARISON: CT abdomen and pelvis 12/07/2023 CLINICAL HISTORY: SBO (small bowel obstruction) (HCC) 881154. Pt currently admitted for small bowel obstruction FINDINGS: LUNGS AND PLEURA: No consolidation or pulmonary edema. No pleural effusion or pneumothorax. HEART AND MEDIASTINUM: Moderate-sized hiatal hernia. BOWEL: There are multiple dilated loops of small bowel within the central abdomen. Contrast noted throughout the colon. No bowel obstruction. PERITONEUM AND SOFT TISSUES: No abnormal calcifications. No free air. BONES: No acute osseous abnormality. LINES AND TUBES: Enteric tube tip and side port project over the stomach. Cholecystectomy clips. IMPRESSION: 1. Multiple dilated loops of small bowel within the central abdomen, consistent with small bowel obstruction. Electronically signed by: Donnice Mania MD 12/09/2023 08:34 AM EDT RP Workstation: [id number]   DG Abd Portable 1V-Small Bowel Obstruction Protocol-initial, 8 hr delay Result Date: 12/08/2023 CLINICAL DATA:  Small-bowel obstruction 8 hour delayed film. EXAM: PORTABLE ABDOMEN - 1 VIEW COMPARISON:  Earlier study today at 12:47 p.m. FINDINGS: 3:46 p.m. Contrast in the large bowel has progressed through  into the rectum. Small bowel dilatation in the central abdomen however, is unchanged with small-bowel loops measuring up to 4.3 cm. Dilated small bowel contrast is seen in dilated loops in the left mid abdomen. There is faint contrast filling of decompressed distal ileal loops in the right lower abdominopelvic quadrant. IMPRESSION: 1. Contrast in the large bowel has progressed through into the rectum. 2. Small bowel dilatation in the central abdomen is unchanged. 3. Continued faint contrast filling of decompressed distal ileal loops in the right lower abdominopelvic quadrant. Electronically Signed   By: Francis Quam M.D.   On: 12/08/2023 20:07   DG Abd 2 Views  Result Date: 12/08/2023 CLINICAL DATA:  [id number] Small bowel obstruction (HCC) 881155. EXAM: ABDOMEN - 2 VIEW COMPARISON:  12/07/2023. FINDINGS: There are several disproportionately dilated bowel loops overlying the left side of the abdomen with largest loop measuring up to 4.2 cm overlying the left lower quadrant. However, there is new faint positive contrast in the colon, thereby excluding high-grade bowel obstruction. No evidence of pneumoperitoneum. No acute osseous abnormalities. The soft tissues are within normal limits. Surgical changes, devices, tubes and lines: Enteric tube is seen with its tip in side hole overlying the left upper quadrant, overlying the proximal stomach region. There are surgical clips in the right upper quadrant, typical of a previous cholecystectomy. IMPRESSION: *Several disproportionately dilated small bowel loops overlying the left side of the abdomen with largest loop measuring up to 4.2 cm overlying the left lower quadrant. However, there is new faint positive contrast in the colon, thereby excluding high-grade bowel obstruction. Findings therefore favor residual/partial small bowel obstruction. Electronically Signed   By: Ree Molt M.D.   On: 12/08/2023 13:30               LOS: 2 days   Brittinee Risk  [facility]   Pager on [email or web address]. If 7PM-7AM, please contact night-coverage at [email or web address]     12/09/2023, 12:30 PM

## 2023-12-09 NOTE — Anesthesia Preprocedure Evaluation (Addendum)
 Anesthesia Evaluation  Patient identified by MRN, date of birth, ID band Patient awake    Reviewed: Allergy & Precautions, H&P , NPO status , Patient's Chart, lab work & pertinent test results  Airway Mallampati: II  TM Distance: >3 FB Neck ROM: full    Dental no notable dental hx.    Pulmonary asthma , COPD   Pulmonary exam normal        Cardiovascular negative cardio ROS Normal cardiovascular exam     Neuro/Psych  Headaches PSYCHIATRIC DISORDERS  Depression    TIA (remote)   GI/Hepatic Neg liver ROS,GERD  ,,SBO multiple prior abdominal surgery including gastric bypass   Endo/Other  negative endocrine ROS    Renal/GU      Musculoskeletal   Abdominal  (+) + obese  Peds  Hematology negative hematology ROS (+)   Anesthesia Other Findings  She has an NG tube for gastric decompression  Past Medical History: No date: Asthma No date: Bariatric surgery status No date: Chronic pain No date: COPD (chronic obstructive pulmonary disease) (HCC) No date: Depressive disorder No date: GERD (gastroesophageal reflux disease) No date: Hepatitis C antibody test positive No date: History of PCR DNA positive for HSV2 No date: Low serum vitamin B12 No date: Malabsorption No date: Migraine with aura and without status migrainosus No date: Migraines No date: Mixed stress and urge urinary incontinence No date: Osteopenia of left hip No date: Osteopenia of spine No date: Rectocele No date: TIA (transient ischemic attack)  Past Surgical History: No date: ABDOMINAL HYSTERECTOMY     Comment:  total No date: CESAREAN SECTION No date: CHOLECYSTECTOMY 11/07/2023: COLONOSCOPY; N/A     Comment:  Procedure: COLONOSCOPY;  Surgeon: Maryruth Ole DASEN,               MD;  Location: [facility] ENDOSCOPY;  Service: Endoscopy;                Laterality: N/A; No date: DIAGNOSTIC LAPAROSCOPY No date: GASTRIC BYPASS No date: INCONTINENCE  SURGERY; N/A No date: REPAIR RECTOCELE; N/A  BMI    Body Mass Index: 31.16 kg/m      Reproductive/Obstetrics negative OB ROS                              Anesthesia Physical Anesthesia Plan  ASA: 3  Anesthesia Plan: General ETT   Post-op Pain Management: Ofirmev  IV (intra-op)* and Toradol IV (intra-op)*   Induction: Intravenous  PONV Risk Score and Plan: 3 and Ondansetron , Dexamethasone  and Scopolamine  patch - Pre-op  Airway Management Planned: Oral ETT, Video Laryngoscope Planned and Awake Intubation Planned  Additional Equipment:   Intra-op Plan:   Post-operative Plan: Extubation in OR  Informed Consent: I have reviewed the patients History and Physical, chart, labs and discussed the procedure including the risks, benefits and alternatives for the proposed anesthesia with the patient or authorized representative who has indicated his/her understanding and acceptance.     Dental Advisory Given  Plan Discussed with: CRNA and Surgeon  Anesthesia Plan Comments: (Awake intubation planned)         Anesthesia Quick Evaluation

## 2023-12-09 NOTE — Op Note (Signed)
 PROCEDURES: 1. Laparotomy reduction of internal hernia 2. Small bowel resection with primary stapled anastomosis 3. Repair of jejunal ulceration just distal to gatrojejunostomy  Pre-operative Diagnosis:SBO , perforation GI track  Post-operative Diagnosis:   Surgeon: Laneta FALCON Cleaven Demario   Assistants: Darlyn Platt [facility]  Anesthesia: General endotracheal anesthesia  ASA Class: 3   Surgeon: Laneta Luna , MD FACS  Anesthesia: Gen. with endotracheal tube   Findings: Purulent peritonitis from perforated ulcer, active infection at the time of surgery Internal hernia and and adhesions causing SBO requiring small bowel resection Marginal ulcer vs ulceration near gastrojejunostomy, difficult to tell due to inflammatory response Ulcer was found only after infusing methylene blue  via NG tube Water  and air tight repair at jejunal ulceration New NG placed 30 cm distal to ulcer repair  Estimated Blood Loss: 50cc         Drains: 19 blake drain at repair of jejunal ulcer         Specimens: small bowel          Complications: none               Condition: stable  Procedure Details  The patient was seen again in the Holding Room. The benefits, complications, treatment options, and expected outcomes were discussed with the patient. The risks of bleeding, infection, recurrence of symptoms, failure to resolve symptoms,  bowel injury, any of which could require further surgery were reviewed with the patient.   The patient was taken to Operating Room, identified as ALVIS PULCINI and the procedure verified.  A Time Out was held and the above information confirmed.  Prior to the induction of general anesthesia, antibiotic prophylaxis was administered. VTE prophylaxis was in place. General endotracheal anesthesia was then administered and tolerated well. After the induction, the abdomen was prepped with Chloraprep and draped in the sterile fashion. The patient was positioned in the supine  position.  Generous upper midline laparotomy performed with 10 blade the fascia was divided and the peritoneum was elevated, using metz abdominal cavity was entered. There was evidence of purulent fluid. There was dilated loop of small bowel and an internal hernia within the lowe abdomen was reduced, it was being caused by an dense adhesion from the abdominal wall to the small bowel. Enterolysis performed with metz, but this segment where the transition zone was had a contusion and was already disease, upon completing enterolysis this disease segment ended up with a defect. Due to the disease segment I decided to perform a bowel resection, proximal and distal mesenteric windows created and bowel was resected ( 10 cms piece) using 75 mm GIA staplers. A side to side functional end to end stapler anastomosis created w 75 mm GIA and common channel was closed in a two layer fashion w interrupted 2-0 vicryls and 3-0 continuous stratafix lembert repair. Anastomosis was widely patent, well perfused and w/o leaks. Mesenteric defect closed wit h a running vicryl. She had more adhesion that we needed to lysed in order to restore her anatomy. Inspection of the foregut revealed  a widely patent jejunojejunostomy. We asked Anesthesia to instilled blue dye via ngt since the ulcer was not evident, after this was done there was extravasation of dye just distal or at the gastrojejunostomy. It ws small about 5mm, multiple 2-0 silks were used to repair the ulcer. I did reinforced the repair with a serosal patch from adjacent bowel and a 19 blake drain was placed at the repair. We tested the repair and  it was water  and air tight. Abdominal cavity was irrigated profusely w saline and Iricept. Seprafilm was sued to prevent adhesions.   I closed the  abdominal cavity with a 0 PDS suture in a running fashion using the small bite technique and the skin was closed with staples. Liposomal Marcaine   was injected on all incision sites  under direct visualization.Needle and laparotomy count were correct and there were no immediate occasions Note That MR ScHulz PA-C was required due to the complexity of the case, he assisted during the entire case, making possible a safe operation. HE retracted, helped with repair, anastomosis and closure.  Laneta Luna, MD, FACS

## 2023-12-09 NOTE — TOC CM/SW Note (Signed)
 Transition of Care [facility]) - Inpatient Brief Assessment   Patient Details  Name: Sandra Chung MRN: [id number] Date of Birth: 10-05-1960  [facility] of [facility]) CM/SW Contact:    Lauraine JAYSON Carpen, LCSW Phone Number: 12/09/2023, 9:53 AM   Clinical Narrative: CSW reviewed chart. No TOC needs identified so far. CSW will continue to follow progress. Please place [facility] consult if any needs arise.  Transition of Care Asessment: Insurance and Status: Insurance coverage has been reviewed Patient has primary care physician: Yes Home environment has been reviewed: Single family home Prior level of function:: Not documented Prior/Current Home Services: No current home services Social Drivers of Health Review: SDOH reviewed no interventions necessary Readmission risk has been reviewed: Yes Transition of care needs: no transition of care needs at this time

## 2023-12-10 ENCOUNTER — Other Ambulatory Visit: Payer: Self-pay

## 2023-12-10 ENCOUNTER — Encounter: Payer: Self-pay | Admitting: Surgery

## 2023-12-10 DIAGNOSIS — K56609 Unspecified intestinal obstruction, unspecified as to partial versus complete obstruction: Secondary | ICD-10-CM | POA: Diagnosis not present

## 2023-12-10 LAB — CBC
HCT: 33.8 % — ABNORMAL LOW (ref 36.0–46.0)
Hemoglobin: 11.4 g/dL — ABNORMAL LOW (ref 12.0–15.0)
MCH: 31.9 pg (ref 26.0–34.0)
MCHC: 33.7 g/dL (ref 30.0–36.0)
MCV: 94.7 fL (ref 80.0–100.0)
Platelets: 208 K/uL (ref 150–400)
RBC: 3.57 MIL/uL — ABNORMAL LOW (ref 3.87–5.11)
RDW: 12.3 % (ref 11.5–15.5)
WBC: 8.1 K/uL (ref 4.0–10.5)
nRBC: 0 % (ref 0.0–0.2)

## 2023-12-10 LAB — VITAMIN B12: Vitamin B-12: <150 pg/mL — ABNORMAL LOW (ref 180–914)

## 2023-12-10 LAB — BASIC METABOLIC PANEL WITH GFR
Anion gap: 11 (ref 5–15)
BUN: 16 mg/dL (ref 8–23)
CO2: 25 mmol/L (ref 22–32)
Calcium: 8.7 mg/dL — ABNORMAL LOW (ref 8.9–10.3)
Chloride: 104 mmol/L (ref 98–111)
Creatinine, Ser: 1.2 mg/dL — ABNORMAL HIGH (ref 0.44–1.00)
GFR, Estimated: 51 mL/min — ABNORMAL LOW (ref >60)
Glucose, Bld: 110 mg/dL — ABNORMAL HIGH (ref 70–99)
Potassium: 3.8 mmol/L (ref 3.5–5.1)
Sodium: 140 mmol/L (ref 135–145)

## 2023-12-10 LAB — PHOSPHORUS: Phosphorus: 3.5 mg/dL (ref 2.5–4.6)

## 2023-12-10 LAB — VITAMIN D 25 HYDROXY (VIT D DEFICIENCY, FRACTURES): Vit D, 25-Hydroxy: 27.04 ng/mL — ABNORMAL LOW (ref 30–100)

## 2023-12-10 LAB — MAGNESIUM: Magnesium: 1.6 mg/dL — ABNORMAL LOW (ref 1.7–2.4)

## 2023-12-10 LAB — FOLATE: Folate: 7.6 ng/mL (ref >5.9)

## 2023-12-10 LAB — GLUCOSE, CAPILLARY: Glucose-Capillary: 106 mg/dL — ABNORMAL HIGH (ref 70–99)

## 2023-12-10 MED ORDER — SODIUM CHLORIDE 0.9% FLUSH: 10.0000 mL | INTRAVENOUS | Status: DC | PRN

## 2023-12-10 MED ORDER — SODIUM CHLORIDE 0.9% FLUSH
10.0000 mL | Freq: Two times a day (BID) | INTRAVENOUS | Status: DC
  Administered 2023-12-10 – 2023-12-14 (×7): 10 mL
  Administered 2023-12-15: 20 mL
  Administered 2023-12-15 – 2023-12-16 (×2): 10 mL
  Administered 2023-12-16: 20 mL
  Administered 2023-12-17 – 2023-12-20 (×4): 10 mL

## 2023-12-10 MED ORDER — MAGNESIUM SULFATE 2 GM/50ML IV SOLN
2.0000 g | Freq: Once | INTRAVENOUS | Status: AC
  Administered 2023-12-10: 2 g via INTRAVENOUS
  Filled 2023-12-10: qty 50

## 2023-12-10 MED ORDER — ALBUMIN HUMAN 25 % IV SOLN
25.0000 g | Freq: Once | INTRAVENOUS | Status: AC
  Administered 2023-12-10: 25 g via INTRAVENOUS
  Filled 2023-12-10: qty 100

## 2023-12-10 MED ORDER — HEPARIN SODIUM (PORCINE) 5000 UNIT/ML IJ SOLN
5000.0000 [IU] | Freq: Three times a day (TID) | INTRAMUSCULAR | Status: DC
  Administered 2023-12-10 – 2023-12-19 (×26): 5000 [IU] via SUBCUTANEOUS
  Filled 2023-12-10 (×28): qty 1

## 2023-12-10 MED ORDER — INSULIN ASPART 100 UNIT/ML IJ SOLN
0.0000 [IU] | INTRAMUSCULAR | Status: DC
  Administered 2023-12-11 (×3): 1 [IU] via SUBCUTANEOUS
  Administered 2023-12-11: 2 [IU] via SUBCUTANEOUS
  Administered 2023-12-11 – 2023-12-18 (×26): 1 [IU] via SUBCUTANEOUS
  Filled 2023-12-10 (×32): qty 1

## 2023-12-10 MED ORDER — CHLORHEXIDINE GLUCONATE CLOTH 2 % EX PADS
6.0000 | MEDICATED_PAD | Freq: Every day | CUTANEOUS | Status: DC
  Administered 2023-12-10 – 2023-12-20 (×11): 6 via TOPICAL

## 2023-12-10 MED ORDER — LACTATED RINGERS IV SOLN: INTRAVENOUS | Status: AC

## 2023-12-10 MED ORDER — THIAMINE HCL 100 MG/ML IJ SOLN
100.0000 mg | Freq: Once | INTRAMUSCULAR | Status: AC
  Administered 2023-12-10: 100 mg via INTRAVENOUS
  Filled 2023-12-10: qty 2

## 2023-12-10 MED ORDER — THIAMINE HCL 100 MG/ML IJ SOLN
100.0000 mg | INTRAMUSCULAR | Status: AC
  Administered 2023-12-11 – 2023-12-16 (×6): 100 mg via INTRAVENOUS
  Filled 2023-12-10 (×6): qty 2

## 2023-12-10 MED ORDER — TRACE MINERALS CU-MN-SE-ZN 300-55-60-3000 MCG/ML IV SOLN
INTRAVENOUS | Status: AC
  Filled 2023-12-10: qty 336

## 2023-12-10 NOTE — Anesthesia Postprocedure Evaluation (Signed)
 Anesthesia Post Note  Patient: [facility]  Procedure(s) Performed: LAPAROTOMY, EXPLORATORY (Abdomen) EXCISION, SMALL INTESTINE (Abdomen) REPAIR, ULCER, PEPTIC, PERFORATED (Abdomen)  Patient location during evaluation: PACU Anesthesia Type: General Level of consciousness: awake and alert Pain management: pain level controlled Vital Signs Assessment: post-procedure vital signs reviewed and stable Respiratory status: spontaneous breathing, nonlabored ventilation and respiratory function stable Cardiovascular status: blood pressure returned to baseline and stable Postop Assessment: no apparent nausea or vomiting Anesthetic complications: no   There were no known notable events for this encounter.   Last Vitals:  Vitals:   12/09/23 2032 12/10/23 0450  BP: 112/64 134/65  Pulse: 95 89  Resp: 16 16  Temp: 37.5 C 36.9 C  SpO2: 96% 97%    Last Pain:  Vitals:   12/10/23 0450  TempSrc: Oral  PainSc:                  Camellia Merilee Louder

## 2023-12-10 NOTE — Consult Note (Addendum)
 PHARMACY - TOTAL PARENTERAL NUTRITION CONSULT NOTE   Indication: Prolonged ileus  Patient Measurements: Height: 5' 1 (154.9 cm) Weight: 74.8 kg (164 lb 14.5 oz) IBW/kg (Calculated) : 47.8 TPN AdjBW (KG): 54.6 Body mass index is 31.16 kg/m. Usual Weight: 75.1 kg  Assessment:  63 yo F with PMH including GERD, extensive abdominal surgical history (including gastric bypass, hysterectomy, cholecystectomy) is presenting with SBO and perforation s/p resection and repair on 9/16.  Glucose / Insulin : CBG 110, no history of diabetes Electrolytes: Mg 1.6 >> will order magnesium  sulfate 2g IV x 1 ahead of TPN Renal: Scr 0.9>1.2 Hepatic: AST, ALT within normal limits on 9/14; CMP ordered for tomorrow Intake / Output; MIVF: 750 cc via NGT; 50 cc via surgical drain GI Imaging: 9/16 CTAP: Persistent high-grade small bowel obstruction, likely due to adhesions, but completely small-bowel obstruction is excluded. Significant dilation of the excluded stomach from enterogastric reflux.  GI Surgeries / Procedures:  9/16 OR: 1. Laparotomy reduction of internal hernia 2. Small bowel resection with primary stapled anastomosis 3. Repair of jejunal ulceration just distal to gatrojejunostomy  Central access: PICC to be placed 9/17 TPN start date: 9/17  Nutritional Goals: Goal TPN rate is 70 mL/hr (provides 100 g of protein and 1950 kcals per day)  RD Assessment: RD consulted, communicated: 1800-2100kcal/day, 90-105g/day protein and 1.5-1.7L/day fluid, she will need thiamine  for 7 days due to history of gastric bypass  Current Nutrition:  NPO  Plan:  Start TPN at 35mL/hr (half-rate) at 1800 Electrolytes in TPN: Na 66mEq/L, K 50mEq/L, Ca 43mEq/L, Mg 93mEq/L, and Phos 15mmol/L. Cl:Ac 1:1 Add standard MVI and trace elements to TPN Initiate Sensitive q4h SSI and adjust as needed Initiate thiamine  100mg  IV daily x 7 days (administering outside TPN) Reduce MIVF to 35 mL/hr at 1800 when the TPN  starts Monitor electrolytes the next 3 days due to refeeding risk Monitor TPN labs on Mon/Thurs  Will M. Lenon, PharmD, BCPS Clinical Pharmacist 12/10/2023 11:37 AM

## 2023-12-10 NOTE — Progress Notes (Addendum)
 [facility] SURGICAL PROGRESS NOTE  Hospital Day(s): 3.   Post op day(s): 1 Day Post-Op.   Interval History:  Patient seen and examined No acute events or new complaints overnight.  Patient reports she is doing better than she thought Abdomen is sore expectedly She is thirsty No fever, chills, nausea, emesis She is without leukocytosis; WBC 8.1K Hgb to 11.4 Slight AKI; sCr - 1.20; UO - 265 ccs Hypomagnesemia to 1.6 NGT with 750 ccs  Surgical drain 55 ccs out; serosanguinous She is on Zosyn  She is NPO  Vital signs in last 24 hours: [min-max] current  Temp:  [97.4 F (36.3 C)-99.5 F (37.5 C)] 98.4 F (36.9 C) (09/17 0450) Pulse Rate:  [89-100] 89 (09/17 0450) Resp:  [13-97] 16 (09/17 0450) BP: (112-159)/(63-100) 134/65 (09/17 0450) SpO2:  [93 %-100 %] 97 % (09/17 0450)     Height: 5' 1 (154.9 cm) Weight: 74.8 kg BMI (Calculated): 31.17   Intake/Output last 2 shifts:  09/16 0701 - 09/17 0700 In: 4204.7 [P.O.:240; I.V.:2764.7; IV Piggyback:1200] Out: 1190 [Urine:265; Emesis/NG output:750; Drains:55; Blood:20]   Physical Exam:  Constitutional: alert, cooperative and no distress  HEENT: NGT in place; output dark and stagnant appearing Respiratory: breathing non-labored at rest  Cardiovascular: regular rate and sinus rhythm  Gastrointestinal: Soft, incisional soreness; distended, no rebound/guarding. Surgical drain in right abdomen; output serous  Genitourinary: Foley in place; good UO Integumentary: Laparotomy is CDI with staples   Labs:     Latest Ref Rng & Units 12/10/2023    5:05 AM 12/08/2023    5:15 AM 12/07/2023    7:22 AM  CBC  WBC 4.0 - 10.5 K/uL 8.1  9.4  11.1   Hemoglobin 12.0 - 15.0 g/dL 88.5  87.7  85.6   Hematocrit 36.0 - 46.0 % 33.8  37.6  42.9   Platelets 150 - 400 K/uL 208  251  300       Latest Ref Rng & Units 12/10/2023    5:05 AM 12/09/2023    5:25 AM 12/08/2023    5:15 AM  CMP  Glucose 70 - 99 mg/dL 889  883  84   BUN 8 - 23  mg/dL 16  18  15    Creatinine 0.44 - 1.00 mg/dL 8.79  9.09  9.23   Sodium 135 - 145 mmol/L 140  142  141   Potassium 3.5 - 5.1 mmol/L 3.8  4.2  3.9   Chloride 98 - 111 mmol/L 104  105  109   CO2 22 - 32 mmol/L 25  24  26    Calcium 8.9 - 10.3 mg/dL 8.7  9.1  8.7     Imaging studies: No new pertinent imaging studies   Assessment/Plan:  63 y.o. female 1 Day Post-Op s/p laparotomy, small bowel resection, and repair of jejunal ulceration just distal to gatrojejunostomy    - She will need to be STRICT NPO likely until Monday 09/22. At that time, we will likely get UGI to reassess jejunal ulceration repair in setting of previous gastric bypass to ensure no leak prior to initiating diet.   - From a nutritional perspective, NGT verified and placed beyond area of perforation intra-operatively. We could potentially use this for enteric feedings. Would not be surprised however if she develops ileus especially give high volume and dark output. Likely need PICC and TPN for now   - Continue IV Abx (Zosyn ); She does have Cx from OR pending  - Continue surgical drain; monitor and record  output  - Continue foley today given AKI; monitor UO - She needs PPI; Pantoprazole  80 mg IV BID - Monitor abdominal examination; on-going bowel function - Pain control prn; antiemetics prn - Further management per primary service; we will follow    All of the above findings and recommendations were discussed with the patient, and the medical team, and all of patient's questions were answered to her expressed satisfaction.  -- Arthea Platt, PA-C [facility] 12/10/2023, 7:44 AM M-F: 7am - 4pm

## 2023-12-10 NOTE — Progress Notes (Signed)
 Initial Nutrition Assessment  DOCUMENTATION CODES:   Obesity unspecified  INTERVENTION:   TPN per pharmacy   Recommend thiamine  100mg  IV daily x 7 days   Pt at high refeed risk; recommend monitor potassium, magnesium  and phosphorus labs daily until stable  Daily weights   Check vitamins A, D, E, zinc, copper , B12, folate and B1 labs.   NUTRITION DIAGNOSIS:   Inadequate oral intake related to altered GI function as evidenced by NPO status.  GOAL:   Patient will meet greater than or equal to 90% of their needs  MONITOR:   Diet advancement, Labs, Weight trends, Skin, I & O's, Other (Comment) (TPN)  REASON FOR ASSESSMENT:   Consult New TPN/TNA  ASSESSMENT:   63 y/o female with h/o asthma, COPD, OSA, depression, GERD, HTN, HLD, remote cocaine use, hiatal hernia, chronic pain, SIBO, HCV, gonorrhea, gastric ulcer secondary to H. pylori, rectocele repair s/p bladder sling, s/p hysterectomy, s/p cholecystectomy, morbid obesity s/p roux-en-y gastric bypass (2010), s/p diagnostic laparoscopy with LOA and tightening of mesenteric defects (2012), lymphedema, TIA and internal hernia who is admitted with peritonitis, SBO secondary to internal hernia & adhesions and perforated ulcer now s/p laparotomy with LOA, reduction of internal hernia, small bowel resection (~10cm) with primary stapled anastomosis & repair of jejunal ulceration (located just distal to gatrojejunostomy) 9/16.  Met with pt in room today. Pt reports that she is feeling much better. Pt reports good appetite and oral intake at baseline. Pt with nausea, abdominal pain and emesis at the time of admission. Pt with h/o roux-en-y gastric bypass. Pt reports that her UBW from high school was 98lbs. Pt weighed 209lbs at the time of her gastric bypass and lost down to 110lbs. Pt has now gained back up to 165lbs. Pt reports that she no longer takes any vitamins as she reports they upset her stomach. Pt reports that she may be able to  tolerate a small gel capsule or a chewable multivitamin but she can't tolerate large pills or gummies. 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   RD discussed with pt the importance of adequate nutrition needed to preserve lean muscle and support post op healing. Pt s/p PICC line today and plan is for TPN initiation tonight as plan is for NPO until next week. Pt is likely at refeed risk. NGT in place to LIS (located 30cm distal to ulcer repair per surgery note) with output. Pt is noted to have had a BM yesterday.   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 RD will check vitamin levels to ensure levels are adequate for post op healing; this was discussed with patient today.   Medications reviewed and include: heparin , insulin , protonix , scopolamine , thiamine , LRS @35ml /hr, zosyn , TPN   Labs reviewed: K 3.8 wnl, creat 1.20(H), P 3.5 wnl, Mg 1.6(L) Vitamin D - 27.04(L), folate 7.6 wnl  UOP-   Drains- 55ml   NUTRITION - FOCUSED PHYSICAL EXAM:  Flowsheet Row Most Recent Value  Orbital Region No depletion  Upper Arm Region No depletion  Thoracic and Lumbar Region No depletion  Buccal Region No depletion  Temple Region No depletion  Clavicle Bone Region No depletion  Clavicle and Acromion Bone Region No depletion  Scapular Bone Region No depletion  Dorsal Hand No depletion  Patellar Region No depletion  Anterior Thigh Region No  depletion  Posterior Calf Region No depletion  Edema (RD Assessment) None  Hair Reviewed  Eyes Reviewed  Mouth Reviewed  Skin Reviewed  Nails Reviewed   Diet Order:   Diet Order             Diet NPO time specified  Diet effective now                  EDUCATION NEEDS:   Education needs have been addressed  Skin:  Skin Assessment: Reviewed RN  Assessment (incision abdomen)  Last BM:  9/16- type 5  Height:   Ht Readings from Last 1 Encounters:  12/07/23 5' 1 (1.549 m)    Weight:   Wt Readings from Last 1 Encounters:  12/07/23 74.8 kg    Ideal Body Weight:  47.7 kg  BMI:  Body mass index is 31.16 kg/m.  Estimated Nutritional Needs:   Kcal:  1800-2100kcal/day  Protein:  90-105g/day  Fluid:  1.5-1.7L/day  Augustin Shams MS, RD, LDN If unable to be reached, please send secure chat to RD inpatient available from 8:00a-4:00p daily

## 2023-12-10 NOTE — Progress Notes (Signed)
 PROGRESS NOTE    Sandra Chung [facility]  FMW:[id number] DOB: [birth year] DOA: 12/07/2023 PCP: Jyl Railing, MD  Chief Complaint  Patient presents with   Abdominal Pain    Hospital Course:  Sandra Chung 63 year old female with GERD, COPD, multiple prior abdominal surgeries including gastric bypass, hysterectomy, cholecystectomy, presented to the hospital with abdominal pain for 2 days with accompanying nausea and vomiting.  She was found to have an SBO and was admitted. She was initially managed conservatively with NG tube and was having bowel movements as well as passing a Gastrografin  challenge however KUB continue to reveal obstructive pattern and she had significant pain.  Repeat CT abdomen pelvis 9/16 revealed NG tube appeared to have perforated anterior wall of jejunum.  Patient went for urgent exploratory laparotomy on 9/16.  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  Subjective: This morning patient reports her pain is well-controlled.  She is bothered by some mucus production and is anxious to try food.   Objective: Vitals:   12/10/23 0450 12/10/23 0741 12/10/23 0842 12/10/23 1319  BP: 134/65 130/73 130/65 115/65  Pulse: 89 88 90 94  Resp: 16 16 15 16   Temp: 98.4 F (36.9 C) 98.1 F (36.7 C) 98.3 F (36.8 C) 98.5 F (36.9 C)  TempSrc: Oral Oral  Oral  SpO2: 97% 95% 96% 96%  Weight:      Height:        Intake/Output Summary (Last 24 hours) at 12/10/2023 1510 Last data filed at 12/10/2023 1230 Gross per 24 hour  Intake 3024.69 ml  Output 1145 ml  Net 1879.69 ml   Filed Weights   12/07/23 0721 12/07/23 1200  Weight: 74.8 kg 74.8 kg    Examination: General exam: Appears calm and comfortable, NAD  Respiratory system: No work of breathing, symmetric chest wall expansion Cardiovascular system: S1 & S2 heard, RRR.  Gastrointestinal system: Abdomen  is distended, abdominal binder in place, surgical site is well-healing, no oozing or bleeding at this time.  NG tube remains in place.  Abdomen is appropriately tender to palpation.  NG canister with dark green liquid. Neuro: Alert and oriented. No focal neurological deficits. Extremities: Symmetric, expected ROM Skin: No rashes, lesions Psychiatry: Demonstrates appropriate judgement and insight. Mood & affect appropriate for situation.   Assessment & Plan:  Principal Problem:   Small bowel obstruction (HCC) Active Problems:   Perforated ulcer of intestine (HCC)   Small bowel obstruction with perforation - Ex lap 9/16: Laparotomy reduction of internal hernia, small bowel resection with primary stapled anastomosis, repair of jejunal ulceration just distal to gastrojejunostomy - NG tube in place, and placed intraoperatively reportedly past area of perforation - General Surgery recommending strict n.p.o. until Monday 9/22.  Will need upper GI at that time to reassess jejunal ulceration repair in setting of previous gastric bypass to ensure no leak - Very high risk of ileus - Continue with NG to suction for now - Have ordered for PICC line and TPN - TPN per pharmacy - Continue with Zosyn , follow cultures from the OR - Monitor surgical drain output - Continue with PPI  COPD - Continue home dose bronchodilators - Has some increased mucus production - Will add Mucinex  BMI 31 Obesity class I - Outpatient follow up for lifestyle modification and risk factor management  DVT prophylaxis: Subcu heparin  for now.   Code Status: Full Code Disposition: Inpatient pending significant clinical resolution.  Currently on TPN, and NGT  Consultants:  Treatment Team:  Consulting Physician: Marinda Jayson KIDD, MD  Procedures:   Ex lap 9/16  Antimicrobials:  Anti-infectives (From admission, onward)    Start     Dose/Rate Route Frequency Ordered Stop   12/09/23 2300  piperacillin -tazobactam  (ZOSYN ) IVPB 3.375 g        3.375 g 12.5 mL/hr over 240 Minutes Intravenous Every 8 hours 12/09/23 1641     12/09/23 1445  piperacillin -tazobactam (ZOSYN ) IVPB 3.375 g        3.375 g 100 mL/hr over 30 Minutes Intravenous  Once 12/09/23 1440 12/09/23 1350   12/09/23 1330  cefoTEtan  (CEFOTAN ) 2 g in sodium chloride  0.9 % 100 mL IVPB        2 g 200 mL/hr over 30 Minutes Intravenous  Once 12/09/23 1316 12/09/23 1524       Data Reviewed: I have personally reviewed following labs and imaging studies CBC: Recent Labs  Lab 12/07/23 0722 12/08/23 0515 12/10/23 0505  WBC 11.1* 9.4 8.1  HGB 14.3 12.2 11.4*  HCT 42.9 37.6 33.8*  MCV 94.7 97.4 94.7  PLT 300 251 208   Basic Metabolic Panel: Recent Labs  Lab 12/07/23 0722 12/08/23 0515 12/09/23 0525 12/10/23 0505  NA 139 141 142 140  K 4.6 3.9 4.2 3.8  CL 104 109 105 104  CO2 22 26 24 25   GLUCOSE 125* 84 116* 110*  BUN 15 15 18 16   CREATININE 0.92 0.76 0.90 1.20*  CALCIUM 9.7 8.7* 9.1 8.7*  MG  --   --  1.7 1.6*  PHOS  --   --  2.9 3.5   GFR: Estimated Creatinine Clearance: 43.8 mL/min (A) (by C-G formula based on SCr of 1.2 mg/dL (H)). Liver Function Tests: Recent Labs  Lab 12/07/23 0722  AST 23  ALT 19  ALKPHOS 98  BILITOT 0.9  PROT 6.8  ALBUMIN  4.0   CBG: No results for input(s): GLUCAP in the last 168 hours.  Recent Results (from the past 240 hours)  Resp panel by RT-PCR (RSV, Flu A&B, Covid) Anterior Nasal Swab     Status: None   Collection Time: 12/07/23  9:05 AM   Specimen: Anterior Nasal Swab  Result Value Ref Range Status   SARS Coronavirus 2 by RT PCR NEGATIVE NEGATIVE Final    Comment: (NOTE) SARS-CoV-2 target nucleic acids are NOT DETECTED.  The SARS-CoV-2 RNA is generally detectable in upper respiratory specimens during the acute phase of infection. The lowest concentration of SARS-CoV-2 viral copies this assay can detect is 138 copies/mL. A negative result does not preclude  SARS-Cov-2 infection and should not be used as the sole basis for treatment or other patient management decisions. A negative result may occur with  improper specimen collection/handling, submission of specimen other than nasopharyngeal swab, presence of viral mutation(s) within the areas targeted by this assay, and inadequate number of viral copies(<138 copies/mL). A negative result must be combined with clinical observations, patient history, and epidemiological information. The expected result is Negative.  Fact Sheet for Patients:  BloggerCourse.com  Fact Sheet for Healthcare Providers:  SeriousBroker.it  This test is no t yet approved or cleared by the [place]  FDA and  has been authorized for detection and/or diagnosis of SARS-CoV-2 by FDA under an Emergency Use Authorization (EUA). This EUA will remain  in effect (meaning this test can be used) for the duration of the COVID-19 declaration under Section 564(b)(1) of the Act, 21 U.S.C.section 360bbb-3(b)(1),  unless the authorization is terminated  or revoked sooner.       Influenza A by PCR NEGATIVE NEGATIVE Final   Influenza B by PCR NEGATIVE NEGATIVE Final    Comment: (NOTE) The Xpert Xpress SARS-CoV-2/FLU/RSV plus assay is intended as an aid in the diagnosis of influenza from Nasopharyngeal swab specimens and should not be used as a sole basis for treatment. Nasal washings and aspirates are unacceptable for Xpert Xpress SARS-CoV-2/FLU/RSV testing.  Fact Sheet for Patients: BloggerCourse.com  Fact Sheet for Healthcare Providers: SeriousBroker.it  This test is not yet approved or cleared by the [place]  FDA and has been authorized for detection and/or diagnosis of SARS-CoV-2 by FDA under an Emergency Use Authorization (EUA). This EUA will remain in effect (meaning this test can be used) for the duration of  the COVID-19 declaration under Section 564(b)(1) of the Act, 21 U.S.C. section 360bbb-3(b)(1), unless the authorization is terminated or revoked.     Resp Syncytial Virus by PCR NEGATIVE NEGATIVE Final    Comment: (NOTE) Fact Sheet for Patients: BloggerCourse.com  Fact Sheet for Healthcare Providers: SeriousBroker.it  This test is not yet approved or cleared by the [place]  FDA and has been authorized for detection and/or diagnosis of SARS-CoV-2 by FDA under an Emergency Use Authorization (EUA). This EUA will remain in effect (meaning this test can be used) for the duration of the COVID-19 declaration under Section 564(b)(1) of the Act, 21 U.S.C. section 360bbb-3(b)(1), unless the authorization is terminated or revoked.  Performed at [facility], [address] [place], [place] [address]   Aerobic/Anaerobic Culture w Gram Stain (surgical/deep wound)     Status: None (Preliminary result)   Collection Time: 12/09/23  2:28 PM   Specimen: Path fluid; Body Fluid  Result Value Ref Range Status   Specimen Description   Final    WOUND Performed at [facility], [address], [place], [place] [address]    Special Requests   Final    INTRA ABDOMINAL FLUID Performed at [facility], [address] [place], [place] [address]    Gram Stain   Final    FEW WBC PRESENT, PREDOMINANTLY PMN NO ORGANISMS SEEN    Culture   Final    NO GROWTH < 12 HOURS Performed at [facility], [address], [place], [place] [address]    Report Status PENDING  Incomplete     Radiology Studies: US  EKG SITE RITE Result Date: 12/10/2023 If Site Rite image not attached, placement could not be confirmed due to current cardiac rhythm.  CT ABDOMEN PELVIS W CONTRAST Result Date: 12/09/2023 CLINICAL DATA:  Bowel obstruction suspected History of bypass, with SBO, concern for possible internal hernia EXAM: CT ABDOMEN  AND PELVIS WITH CONTRAST TECHNIQUE: Multidetector CT imaging of the abdomen and pelvis was performed using the standard protocol following bolus administration of intravenous contrast. RADIATION DOSE REDUCTION: This exam was performed according to the departmental dose-optimization program which includes automated exposure control, adjustment of the mA and/or kV according to patient size and/or use of iterative reconstruction technique. CONTRAST:  OMNIPAQUE  IOHEXOL  300 MG/ML  SOLN COMPARISON:  12/07/2023, 12/08/2023, 12/09/2023 FINDINGS: Lower chest: No focal airspace consolidation or pleural effusion.Fibrolinear scarring in the lung bases. Hepatobiliary: Unchanged hepatic cysts.Cholecystectomy. Mild dilation of the common bile duct, likely related to the prior cholecystectomy. The portal veins are patent. Pancreas: No mass or main ductal dilation. No peripancreatic inflammation or fluid collection. Spleen: Normal size. No mass. Adrenals/Urinary  Tract: No adrenal masses. No renal mass. No nephrolithiasis or hydronephrosis. The urinary bladder is completely decompressed. Stomach/Bowel: Postsurgical changes of a prior gastric bypass. Small to moderate-sized hiatal hernia. Esophagogastric tube in place, which appears to penetrate the wall of the proximal jejunum terminating in the mesentery anterior to the proximal jejunal loop (transfer Biogen 25). The excluded stomach is dilated and fluid-filled, due to entero gastric reflux. Similar diffuse dilation of the small bowel with a transition point in the right lower quadrant. Enteric contrast has passed the transition point into the decompressed ileum and colon extending to the rectum.Normal appendix. Vascular/Lymphatic: No aortic aneurysm. No intraabdominal or pelvic lymphadenopathy. Reproductive: Hysterectomy. No concerning adnexal mass. Other: Mesenteric edema with small volume perihepatic ascites with fluid also visualized in the mesentery and pelvis. No  pneumoperitoneum visualized. Musculoskeletal: No acute fracture or destructive lesion. Osteopenia. Multilevel degenerative disc disease of the spine. IMPRESSION: 1. Esophagogastric tube in place, which appears to penetrate the wall of the proximal jejunum, terminating in the mesentery anterior to the proximal jejunal limb. (Axial 25). Small volume free fluid in the abdomen or pelvis. No pneumoperitoneum. 2. Persistent findings of a high-grade small bowel obstruction, transition point in the right lower quadrant ileum, likely due to adhesions. Significant dilation of the excluded stomach from enterogastric reflux. Enteric contrast passes the transition point into the distal ileum and colon, which excludes complete small-bowel obstruction. Critical Value/emergent results were called by telephone at the time of interpretation on 12/09/2023 at 12:56 pm to provider [facility] PA , who verbally acknowledged these results. Electronically Signed   By: Rogelia Myers M.D.   On: 12/09/2023 13:00   DG ABD ACUTE 2+V W 1V CHEST Result Date: 12/09/2023 EXAM: UPRIGHT AND SUPINE XRAY VIEWS OF THE ABDOMEN AND 4 VIEW(S) OF THE CHEST 12/09/2023 07:57:19 AM COMPARISON: CT abdomen and pelvis 12/07/2023 CLINICAL HISTORY: SBO (small bowel obstruction) (HCC) [id number]. Pt currently admitted for small bowel obstruction FINDINGS: LUNGS AND PLEURA: No consolidation or pulmonary edema. No pleural effusion or pneumothorax. HEART AND MEDIASTINUM: Moderate-sized hiatal hernia. BOWEL: There are multiple dilated loops of small bowel within the central abdomen. Contrast noted throughout the colon. No bowel obstruction. PERITONEUM AND SOFT TISSUES: No abnormal calcifications. No free air. BONES: No acute osseous abnormality. LINES AND TUBES: Enteric tube tip and side port project over the stomach. Cholecystectomy clips. IMPRESSION: 1. Multiple dilated loops of small bowel within the central abdomen, consistent with small bowel obstruction.  Electronically signed by: Donnice Mania MD 12/09/2023 08:34 AM EDT RP Workstation: [id number]   DG Abd Portable 1V-Small Bowel Obstruction Protocol-initial, 8 hr delay Result Date: 12/08/2023 CLINICAL DATA:  Small-bowel obstruction 8 hour delayed film. EXAM: PORTABLE ABDOMEN - 1 VIEW COMPARISON:  Earlier study today at 12:47 p.m. FINDINGS: 3:46 p.m. Contrast in the large bowel has progressed through into the rectum. Small bowel dilatation in the central abdomen however, is unchanged with small-bowel loops measuring up to 4.3 cm. Dilated small bowel contrast is seen in dilated loops in the left mid abdomen. There is faint contrast filling of decompressed distal ileal loops in the right lower abdominopelvic quadrant. IMPRESSION: 1. Contrast in the large bowel has progressed through into the rectum. 2. Small bowel dilatation in the central abdomen is unchanged. 3. Continued faint contrast filling of decompressed distal ileal loops in the right lower abdominopelvic quadrant. Electronically Signed   By: Francis Quam M.D.   On: 12/08/2023 20:07    Scheduled Meds:  budesonide -glycopyrrolate -formoterol   2 puff Inhalation BID  Chlorhexidine  Gluconate Cloth  6 each Topical Daily   heparin  injection (subcutaneous)  5,000 Units Subcutaneous Q8H   insulin  aspart  0-9 Units Subcutaneous Q4H   montelukast   10 mg Oral QHS   pantoprazole  (PROTONIX ) IV  80 mg Intravenous Q12H   scopolamine   1 patch Transdermal Q72H   sodium chloride  flush  10-40 mL Intracatheter Q12H   [START ON 12/11/2023] thiamine  (VITAMIN B1) injection  100 mg Intravenous Q24H   Continuous Infusions:  acetaminophen  1,000 mg (12/10/23 1432)   lactated ringers      piperacillin -tazobactam (ZOSYN )  IV 3.375 g (12/10/23 0551)   promethazine  (PHENERGAN ) injection (IM or IVPB) Stopped (12/09/23 1118)   TPN ADULT (ION)       LOS: 3 days  MDM: Patient is high risk for one or more organ failure.  They necessitate ongoing hospitalization for  continued IV therapies and subsequent lab monitoring. Total time spent interpreting labs and vitals, reviewing the medical record, coordinating care amongst consultants and care team members, directly assessing and discussing care with the patient and/or family: 55 min Shavell Nored, DO [facility]  To contact the attending physician between 7A-7P please use Epic Chat. To contact the covering physician during after hours 7P-7A, please review Amion.  12/10/2023, 3:10 PM   *This document has been created with the assistance of dictation software. Please excuse typographical errors. *

## 2023-12-10 NOTE — Progress Notes (Signed)
 Peripherally Inserted Central Catheter Placement  The IV Nurse has discussed with the patient and/or persons authorized to consent for the patient, the purpose of this procedure and the potential benefits and risks involved with this procedure.  The benefits include less needle sticks, lab draws from the catheter, and the patient may be discharged home with the catheter. Risks include, but not limited to, infection, bleeding, blood clot (thrombus formation), and puncture of an artery; nerve damage and irregular heartbeat and possibility to perform a PICC exchange if needed/ordered by physician.  Alternatives to this procedure were also discussed.  Bard Power PICC patient education guide, fact sheet on infection prevention and patient information card has been provided to patient /or left at bedside.    PICC Placement Documentation  PICC Double Lumen 12/10/23 Right Basilic 36 cm 0 cm (Active)  Indication for Insertion or Continuance of Line Administration of hyperosmolar/irritating solutions (i.e. TPN, Vancomycin, etc.) 12/10/23 1100  Exposed Catheter (cm) 0 cm 12/10/23 1100  Site Assessment Clean, Dry, Intact 12/10/23 1100  Lumen #1 Status Flushed;Saline locked;Blood return noted 12/10/23 1100  Lumen #2 Status Flushed;Saline locked;Blood return noted 12/10/23 1100  Dressing Type Transparent;Securing device 12/10/23 1100  Dressing Status Antimicrobial disc/dressing in place;Clean, Dry, Intact 12/10/23 1100  Line Care Connections checked and tightened 12/10/23 1100  Line Adjustment (NICU/IV Team Only) No 12/10/23 1100  Dressing Intervention New dressing;Adhesive placed at insertion site (IV team only) 12/10/23 1100  Dressing Change Due 12/17/23 12/10/23 1100       Ethyl Priestly Celoron 12/10/2023, 11:37 AM

## 2023-12-10 NOTE — Plan of Care (Signed)
  Problem: Education: Goal: Knowledge of General Education information will improve Description: Including pain rating scale, medication(s)/side effects and non-pharmacologic comfort measures Outcome: Progressing   Problem: Health Behavior/Discharge Planning: Goal: Ability to manage health-related needs will improve Outcome: Progressing   Problem: Clinical Measurements: Goal: Ability to maintain clinical measurements within normal limits will improve Outcome: Progressing Goal: Will remain free from infection Outcome: Progressing Goal: Diagnostic test results will improve Outcome: Progressing Goal: Respiratory complications will improve Outcome: Progressing Goal: Cardiovascular complication will be avoided Outcome: Progressing   Problem: Coping: Goal: Level of anxiety will decrease Outcome: Progressing   Problem: Pain Managment: Goal: General experience of comfort will improve and/or be controlled Outcome: Progressing

## 2023-12-10 NOTE — Care Management Important Message (Signed)
 Important Message  Patient Details  Name: LARKIN ALFRED MRN: [id number] Date of Birth: 1960-12-31   Important Message Given:  Yes - Medicare IM     Rojelio SHAUNNA Rattler 12/10/2023, 1:09 PM

## 2023-12-11 DIAGNOSIS — K56609 Unspecified intestinal obstruction, unspecified as to partial versus complete obstruction: Secondary | ICD-10-CM | POA: Diagnosis not present

## 2023-12-11 LAB — COMPREHENSIVE METABOLIC PANEL WITH GFR
ALT: 20 U/L (ref 0–44)
AST: 22 U/L (ref 15–41)
Albumin: 3.1 g/dL — ABNORMAL LOW (ref 3.5–5.0)
Alkaline Phosphatase: 31 U/L — ABNORMAL LOW (ref 38–126)
Anion gap: 6 (ref 5–15)
BUN: 18 mg/dL (ref 8–23)
CO2: 28 mmol/L (ref 22–32)
Calcium: 8.5 mg/dL — ABNORMAL LOW (ref 8.9–10.3)
Chloride: 105 mmol/L (ref 98–111)
Creatinine, Ser: 0.95 mg/dL (ref 0.44–1.00)
GFR, Estimated: >60 mL/min (ref >60)
Glucose, Bld: 163 mg/dL — ABNORMAL HIGH (ref 70–99)
Potassium: 3.2 mmol/L — ABNORMAL LOW (ref 3.5–5.1)
Sodium: 139 mmol/L (ref 135–145)
Total Bilirubin: 0.6 mg/dL (ref 0.0–1.2)
Total Protein: 5.6 g/dL — ABNORMAL LOW (ref 6.5–8.1)

## 2023-12-11 LAB — GLUCOSE, CAPILLARY
Glucose-Capillary: 125 mg/dL — ABNORMAL HIGH (ref 70–99)
Glucose-Capillary: 126 mg/dL — ABNORMAL HIGH (ref 70–99)
Glucose-Capillary: 129 mg/dL — ABNORMAL HIGH (ref 70–99)
Glucose-Capillary: 142 mg/dL — ABNORMAL HIGH (ref 70–99)
Glucose-Capillary: 146 mg/dL — ABNORMAL HIGH (ref 70–99)
Glucose-Capillary: 179 mg/dL — ABNORMAL HIGH (ref 70–99)

## 2023-12-11 LAB — MAGNESIUM: Magnesium: 2.7 mg/dL — ABNORMAL HIGH (ref 1.7–2.4)

## 2023-12-11 LAB — PHOSPHORUS: Phosphorus: 1.7 mg/dL — ABNORMAL LOW (ref 2.5–4.6)

## 2023-12-11 MED ORDER — CYANOCOBALAMIN 1000 MCG/ML IJ SOLN
1000.0000 ug | Freq: Every day | INTRAMUSCULAR | Status: AC
  Administered 2023-12-12 – 2023-12-18 (×7): 1000 ug via INTRAMUSCULAR
  Filled 2023-12-11 (×8): qty 1

## 2023-12-11 MED ORDER — POTASSIUM PHOSPHATES 15 MMOLE/5ML IV SOLN
15.0000 mmol | Freq: Once | INTRAVENOUS | Status: AC
  Administered 2023-12-11: 15 mmol via INTRAVENOUS
  Filled 2023-12-11: qty 5

## 2023-12-11 MED ORDER — TRACE MINERALS CU-MN-SE-ZN 300-55-60-3000 MCG/ML IV SOLN
INTRAVENOUS | Status: AC
  Filled 2023-12-11: qty 336

## 2023-12-11 MED ORDER — POTASSIUM CHLORIDE 10 MEQ/100ML IV SOLN
10.0000 meq | INTRAVENOUS | Status: AC
  Administered 2023-12-11 (×2): 10 meq via INTRAVENOUS
  Filled 2023-12-11: qty 100

## 2023-12-11 NOTE — Evaluation (Signed)
 Physical Therapy Evaluation Patient Details Name: Sandra Chung MRN: [id number] DOB: 1960/05/05 Today's Date: 12/11/2023  History of Present Illness  Sandra Chung is a 63 y.o. 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 Presents with small bowel obstruction.  9/16 revealed NG tube appeared to have perforated anterior wall of jejunum. S/p laparotomy, small bowel resection, and repair of jejunal ulceration just distal to gatrojejunostomy on 12/09/23   Clinical Impression  Pt A&Ox4, pleasant and agreeable to PT evaluation, denied pain at time of PT session. At baseline, pt reports recent use of rollator for ambulation due to back pain, denies hx of falls, IND with ADLs. Pt was met sitting in recliner, able to complete STS transfer with RW and CGA, VC for hand placement. Pt amb ~332ft with RW and CGA, no LOB and intermittent self-directed standing rest breaks throughout. Pt was left sitting in recliner at end of session, all needs in reach. Pt would benefit from skilled PT intervention to address listed deficits (see PT problem list) and allow for safe return to PLOF.       If plan is discharge home, recommend the following: A little help with walking and/or transfers;A little help with bathing/dressing/bathroom;Assistance with cooking/housework;Assist for transportation;Help with stairs or ramp for entrance   Can travel by private vehicle        Equipment Recommendations Rolling walker (2 wheels)  Recommendations for Other Services       Functional Status Assessment Patient has had a recent decline in their functional status and demonstrates the ability to make significant improvements in function in a reasonable and predictable amount of time.     Precautions / Restrictions Precautions Precautions: None Recall of Precautions/Restrictions:  Intact Restrictions Weight Bearing Restrictions Per Provider Order: No      Mobility  Bed Mobility               General bed mobility comments: NT, pt in recliner at start/end of session    Transfers Overall transfer level: Needs assistance Equipment used: Rolling walker (2 wheels) Transfers: Sit to/from Stand Sit to Stand: Contact guard assist           General transfer comment: STS from recliner with RW and CGA, VC for hand placement    Ambulation/Gait Ambulation/Gait assistance: Contact guard assist Gait Distance (Feet): 300 Feet Assistive device: Rolling walker (2 wheels) Gait Pattern/deviations: Step-through pattern, Narrow base of support       General Gait Details: no LOB, VC for RW management, intermittent pt directed standing rest breaks throughout  Stairs            Wheelchair Mobility     Tilt Bed    Modified Rankin (Stroke Patients Only)       Balance Overall balance assessment: Needs assistance Sitting-balance support: Feet supported Sitting balance-Leahy Scale: Good     Standing balance support: Bilateral upper extremity supported Standing balance-Leahy Scale: Fair                               Pertinent Vitals/Pain Pain Assessment Pain Assessment: No/denies pain Pain Intervention(s): Limited activity within patient's tolerance, Monitored during session, Repositioned    Home Living Family/patient expects to be discharged to:: Private residence Living Arrangements: Alone Available Help at Discharge: Family;Available PRN/intermittently Type of Home: Apartment Home Access: Stairs to enter  Entrance Stairs-Rails: None Entrance Stairs-Number of Steps: 2   Home Layout: One level Home Equipment: Rollator (4 wheels)      Prior Function Prior Level of Function : Independent/Modified Independent             Mobility Comments: pt cites recent use of rollator due to back pain, denies falls ADLs Comments: IND  with ADLs     Extremity/Trunk Assessment   Upper Extremity Assessment Upper Extremity Assessment: Defer to OT evaluation    Lower Extremity Assessment Lower Extremity Assessment: Generalized weakness       Communication   Communication Communication: No apparent difficulties    Cognition Arousal: Alert Behavior During Therapy: WFL for tasks assessed/performed   PT - Cognitive impairments: No apparent impairments                       PT - Cognition Comments: A&Ox4, pleasant Following commands: Intact       Cueing Cueing Techniques: Verbal cues, Visual cues     General Comments      Exercises     Assessment/Plan    PT Assessment Patient needs continued PT services  PT Problem List Decreased strength;Decreased activity tolerance;Decreased balance;Decreased mobility;Decreased knowledge of use of DME;Pain       PT Treatment Interventions DME instruction;Gait training;Stair training;Functional mobility training;Therapeutic activities;Therapeutic exercise;Balance training;Neuromuscular re-education;Patient/family education    PT Goals (Current goals can be found in the Care Plan section)  Acute Rehab PT Goals Patient Stated Goal: to go home PT Goal Formulation: With patient Time For Goal Achievement: 12/25/23 Potential to Achieve Goals: Good    Frequency Min 2X/week     Co-evaluation               AM-PAC PT 6 Clicks Mobility  Outcome Measure Help needed turning from your back to your side while in a flat bed without using bedrails?: A Little Help needed moving from lying on your back to sitting on the side of a flat bed without using bedrails?: A Little Help needed moving to and from a bed to a chair (including a wheelchair)?: A Little Help needed standing up from a chair using your arms (e.g., wheelchair or bedside chair)?: A Little Help needed to walk in hospital room?: A Little Help needed climbing 3-5 steps with a railing? : A  Little 6 Click Score: 18    End of Session   Activity Tolerance: Patient tolerated treatment well Patient left: in chair;with call bell/phone within reach Nurse Communication: Mobility status PT Visit Diagnosis: Unsteadiness on feet (R26.81);Other abnormalities of gait and mobility (R26.89);Muscle weakness (generalized) (M62.81);Pain Pain - part of body:  (abdomen)    Time: 8579-8550 PT Time Calculation (min) (ACUTE ONLY): 29 min   Charges:   PT Evaluation $PT Eval Low Complexity: 1 Low PT Treatments $Therapeutic Activity: 23-37 mins PT General Charges $$ ACUTE PT VISIT: 1 Visit         Janell Axe, SPT

## 2023-12-11 NOTE — Evaluation (Signed)
 Occupational Therapy Evaluation Patient Details Name: Sandra Chung MRN: [id number] DOB: May 16, 1960 Today's Date: 12/11/2023   History of Present Illness   Sandra Chung is a 63 y.o. 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 Presents with small bowel obstruction.  9/16 revealed NG tube appeared to have perforated anterior wall of jejunum. S/p laparotomy, small bowel resection, and repair of jejunal ulceration just distal to gatrojejunostomy on 12/09/23   Clinical Impressions Sandra Chung was seen for OT evaluation this date. Prior to hospital admission, pt was IND, recently using 4WW 2/2 fatigue. Pt lives alone in apartment with 2 STE, family available PRN. Pt currently requires MOD A exit bed, limited by 6/10 abdominal pain. CGA + IV pole for bed>chair t/f. MIN A don/doff gown in sitting. SETUP for seated grooming tasks and educated on abdominal pcns. Pt would benefit from skilled OT to address noted impairments and functional limitations (see below for any additional details). Upon hospital discharge, recommend OT follow up and assist for IADLs on return home.    If plan is discharge home, recommend the following:   A little help with walking and/or transfers;A lot of help with bathing/dressing/bathroom;Help with stairs or ramp for entrance     Functional Status Assessment   Patient has had a recent decline in their functional status and demonstrates the ability to make significant improvements in function in a reasonable and predictable amount of time.     Equipment Recommendations   BSC/3in1     Recommendations for Other Services         Precautions/Restrictions   Precautions Precautions: None Recall of Precautions/Restrictions: Intact Restrictions Weight Bearing Restrictions Per Provider Order: No     Mobility Bed  Mobility Overal bed mobility: Needs Assistance Bed Mobility: Supine to Sit     Supine to sit: Mod assist, HOB elevated          Transfers Overall transfer level: Needs assistance   Transfers: Sit to/from Stand, Bed to chair/wheelchair/BSC Sit to Stand: Contact guard assist     Step pivot transfers: Contact guard assist            Balance Overall balance assessment: Needs assistance Sitting-balance support: No upper extremity supported, Feet supported Sitting balance-Leahy Scale: Good     Standing balance support: Bilateral upper extremity supported Standing balance-Leahy Scale: Fair                             ADL either performed or assessed with clinical judgement   ADL Overall ADL's : Needs assistance/impaired                                       General ADL Comments: MAX A don B socks sitting. CGA + IV pole for bed>chair t/f. MIN A don/doff gown in sitting      Pertinent Vitals/Pain Pain Assessment Pain Assessment: 0-10 Pain Score: 6  Pain Location: abdomen Pain Descriptors / Indicators: Discomfort, Grimacing Pain Intervention(s): Limited activity within patient's tolerance, Repositioned     Extremity/Trunk Assessment Upper Extremity Assessment Upper Extremity Assessment: Overall WFL for tasks assessed   Lower Extremity Assessment Lower Extremity Assessment: Generalized weakness       Communication Communication Communication: No apparent difficulties   Cognition Arousal:  Alert Behavior During Therapy: WFL for tasks assessed/performed Cognition: No apparent impairments                               Following commands: Intact                  Home Living Family/patient expects to be discharged to:: Private residence Living Arrangements: Alone Available Help at Discharge: Family Type of Home: Apartment Home Access: Stairs to enter Secretary/administrator of Steps: 2   Home Layout: One  level               Home Equipment: Rollator (4 wheels)          Prior Functioning/Environment Prior Level of Function : Independent/Modified Independent             Mobility Comments: recently using rollator 2/2 pain/fatigue      OT Problem List: Decreased activity tolerance;Impaired balance (sitting and/or standing);Decreased range of motion   OT Treatment/Interventions: Self-care/ADL training;Therapeutic exercise;Energy conservation;DME and/or AE instruction;Therapeutic activities;Balance training;Patient/family education      OT Goals(Current goals can be found in the care plan section)   Acute Rehab OT Goals Patient Stated Goal: to go home OT Goal Formulation: With patient Time For Goal Achievement: 12/25/23 Potential to Achieve Goals: Good ADL Goals Pt Will Perform Grooming: with modified independence;standing Pt Will Perform Lower Body Dressing: with modified independence;sit to/from stand Pt Will Transfer to Toilet: with modified independence;ambulating;regular height toilet   OT Frequency:  Min 2X/week    Co-evaluation              AM-PAC OT 6 Clicks Daily Activity     Outcome Measure Help from another person eating meals?: None Help from another person taking care of personal grooming?: A Little Help from another person toileting, which includes using toliet, bedpan, or urinal?: A Little Help from another person bathing (including washing, rinsing, drying)?: A Lot Help from another person to put on and taking off regular upper body clothing?: A Little Help from another person to put on and taking off regular lower body clothing?: A Lot 6 Click Score: 17   End of Session Nurse Communication: Mobility status  Activity Tolerance: Patient tolerated treatment well Patient left: in chair;with call bell/phone within reach  OT Visit Diagnosis: Unsteadiness on feet (R26.81)                Time: 8965-8893 OT Time Calculation (min): 32  min Charges:  OT General Charges $OT Visit: 1 Visit OT Evaluation $OT Eval Moderate Complexity: 1 Mod OT Treatments $Self Care/Home Management : 8-22 mins  Elston Slot, M.S. OTR/L  12/11/23, 1:08 PM  ascom [phone]

## 2023-12-11 NOTE — Progress Notes (Signed)
 PROGRESS NOTE    Sandra Chung  FMW:[id number] DOB: 21-Jun-1960 DOA: 12/07/2023 PCP: Jyl Railing, MD  Chief Complaint  Patient presents with   Abdominal Pain    [facility] Course:  LOURDEZ Chung 63 year old female with GERD, COPD, multiple prior abdominal surgeries including gastric bypass, hysterectomy, cholecystectomy, presented to the [facility] with abdominal pain for 2 days with accompanying nausea and vomiting.  She was found to have an SBO and was admitted. She was initially managed conservatively with NG tube and was having bowel movements as well as passing a Gastrografin  challenge however KUB continue to reveal obstructive pattern and she had significant pain.  Repeat CT abdomen pelvis 9/16 revealed NG tube appeared to have perforated anterior wall of jejunum.  Patient went for urgent exploratory laparotomy on 9/16.  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  Subjective: No acute events overnight.  Patient reports her pain is manageable.  Currently experiencing pain mostly in the right upper quadrant where drain is in place.  Objective: Vitals:   12/10/23 1521 12/10/23 1959 12/11/23 0452 12/11/23 1439  BP: 120/65 (!) 146/76 130/72 138/85  Pulse: 90 89 82 92  Resp: 15 16 16 18   Temp: 98.5 F (36.9 C) 98.4 F (36.9 C) 98.1 F (36.7 C) 98.9 F (37.2 C)  TempSrc: Oral   Oral  SpO2: 94% 96% 96% 97%  Weight:      Height:        Intake/Output Summary (Last 24 hours) at 12/11/2023 1449 Last data filed at 12/11/2023 1433 Gross per 24 hour  Intake 1470.74 ml  Output 1645 ml  Net -174.26 ml   Filed Weights   12/07/23 0721 12/07/23 1200  Weight: 74.8 kg 74.8 kg    Examination: General exam: Appears calm and comfortable, NAD  Respiratory system: No work of breathing, symmetric chest wall expansion Cardiovascular system: S1 & S2 heard, RRR.   Gastrointestinal system: Abdomen is distended, abdominal binder in place, surgical site is well-healing, no oozing or bleeding at this time.  Right-sided abdominal drain with serosanguineous fluid in bulb.  NG tube remains in place.  Abdomen is appropriately tender to palpation.  NG canister with dark green liquid. Neuro: Alert and oriented. No focal neurological deficits. Extremities: Symmetric, expected ROM Skin: No rashes, lesions Psychiatry: Demonstrates appropriate judgement and insight. Mood & affect appropriate for situation.   Assessment & Plan:  Principal Problem:   Small bowel obstruction (HCC) Active Problems:   Perforated ulcer of intestine (HCC)   Small bowel obstruction with perforation - Ex lap 9/16: Laparotomy reduction of internal hernia, small bowel resection with primary stapled anastomosis, repair of jejunal ulceration just distal to gastrojejunostomy - NG tube in place, and placed intraoperatively reportedly past area of perforation - General Surgery recommending strict n.p.o. until Monday 9/22.  Will need upper GI at that time to reassess jejunal ulceration repair in setting of previous gastric bypass to ensure no leak - Remains at risk of ileus - Continue with NG to suction for now, management per general surgery - Has PICC line, receiving TPN. - Pharmacy consult for TPN management - Continue with Zosyn , cultures from OR currently without growth - Continue to monitor surgical drain output - Appreciate further input from general surgery team - Continue with PPI  COPD - Continue home dose bronchodilators - Has some increased mucus production - Medications somewhat limited as patient is strict n.p.o.  BMI 31  Obesity class I - Outpatient follow up for lifestyle modification and risk factor management  DVT prophylaxis: Subcu heparin  for now.   Code Status: Full Code Disposition: Inpatient pending significant clinical resolution.  Currently on TPN, and  NGT  Consultants:  Treatment Team:  Consulting Physician: Marinda Jayson KIDD, MD  Procedures:   Ex lap 9/16  Antimicrobials:  Anti-infectives (From admission, onward)    Start     Dose/Rate Route Frequency Ordered Stop   12/09/23 2300  piperacillin -tazobactam (ZOSYN ) IVPB 3.375 g        3.375 g 12.5 mL/hr over 240 Minutes Intravenous Every 8 hours 12/09/23 1641     12/09/23 1445  piperacillin -tazobactam (ZOSYN ) IVPB 3.375 g        3.375 g 100 mL/hr over 30 Minutes Intravenous  Once 12/09/23 1440 12/09/23 1350   12/09/23 1330  cefoTEtan  (CEFOTAN ) 2 g in sodium chloride  0.9 % 100 mL IVPB        2 g 200 mL/hr over 30 Minutes Intravenous  Once 12/09/23 1316 12/09/23 1524       Data Reviewed: I have personally reviewed following labs and imaging studies CBC: Recent Labs  Lab 12/07/23 0722 12/08/23 0515 12/10/23 0505  WBC 11.1* 9.4 8.1  HGB 14.3 12.2 11.4*  HCT 42.9 37.6 33.8*  MCV 94.7 97.4 94.7  PLT 300 251 208   Basic Metabolic Panel: Recent Labs  Lab 12/07/23 0722 12/08/23 0515 12/09/23 0525 12/10/23 0505 12/11/23 0441  NA 139 141 142 140 139  K 4.6 3.9 4.2 3.8 3.2*  CL 104 109 105 104 105  CO2 22 26 24 25 28   GLUCOSE 125* 84 116* 110* 163*  BUN 15 15 18 16 18   CREATININE 0.92 0.76 0.90 1.20* 0.95  CALCIUM 9.7 8.7* 9.1 8.7* 8.5*  MG  --   --  1.7 1.6* 2.7*  PHOS  --   --  2.9 3.5 1.7*   GFR: Estimated Creatinine Clearance: 55.3 mL/min (by C-G formula based on SCr of 0.95 mg/dL). Liver Function Tests: Recent Labs  Lab 12/07/23 0722 12/11/23 0441  AST 23 22  ALT 19 20  ALKPHOS 98 31*  BILITOT 0.9 0.6  PROT 6.8 5.6*  ALBUMIN  4.0 3.1*   CBG: Recent Labs  Lab 12/10/23 1747 12/11/23 0054 12/11/23 0455 12/11/23 0838 12/11/23 1145  GLUCAP 106* 179* 146* 142* 129*    Recent Results (from the past 240 hours)  Resp panel by RT-PCR (RSV, Flu A&B, Covid) Anterior Nasal Swab     Status: None   Collection Time: 12/07/23  9:05 AM   Specimen: Anterior  Nasal Swab  Result Value Ref Range Status   SARS Coronavirus 2 by RT PCR NEGATIVE NEGATIVE Final    Comment: (NOTE) SARS-CoV-2 target nucleic acids are NOT DETECTED.  The SARS-CoV-2 RNA is generally detectable in upper respiratory specimens during the acute phase of infection. The lowest concentration of SARS-CoV-2 viral copies this assay can detect is 138 copies/mL. A negative result does not preclude SARS-Cov-2 infection and should not be used as the sole basis for treatment or other patient management decisions. A negative result may occur with  improper specimen collection/handling, submission of specimen other than nasopharyngeal swab, presence of viral mutation(s) within the areas targeted by this assay, and inadequate number of viral copies(<138 copies/mL). A negative result must be combined with clinical observations, patient history, and epidemiological information. The expected result is Negative.  Fact Sheet for Patients:  BloggerCourse.com  Fact Sheet for Healthcare Providers:  SeriousBroker.it  This test is no t yet approved or cleared by the [place]  FDA and  has been authorized for detection and/or diagnosis of SARS-CoV-2 by FDA under an Emergency Use Authorization (EUA). This EUA will remain  in effect (meaning this test can be used) for the duration of the COVID-19 declaration under Section 564(b)(1) of the Act, 21 U.S.C.section 360bbb-3(b)(1), unless the authorization is terminated  or revoked sooner.       Influenza A by PCR NEGATIVE NEGATIVE Final   Influenza B by PCR NEGATIVE NEGATIVE Final    Comment: (NOTE) The Xpert Xpress SARS-CoV-2/FLU/RSV plus assay is intended as an aid in the diagnosis of influenza from Nasopharyngeal swab specimens and should not be used as a sole basis for treatment. Nasal washings and aspirates are unacceptable for Xpert Xpress SARS-CoV-2/FLU/RSV testing.  Fact Sheet for  Patients: BloggerCourse.com  Fact Sheet for Healthcare Providers: SeriousBroker.it  This test is not yet approved or cleared by the [place]  FDA and has been authorized for detection and/or diagnosis of SARS-CoV-2 by FDA under an Emergency Use Authorization (EUA). This EUA will remain in effect (meaning this test can be used) for the duration of the COVID-19 declaration under Section 564(b)(1) of the Act, 21 U.S.C. section 360bbb-3(b)(1), unless the authorization is terminated or revoked.     Resp Syncytial Virus by PCR NEGATIVE NEGATIVE Final    Comment: (NOTE) Fact Sheet for Patients: BloggerCourse.com  Fact Sheet for Healthcare Providers: SeriousBroker.it  This test is not yet approved or cleared by the [place]  FDA and has been authorized for detection and/or diagnosis of SARS-CoV-2 by FDA under an Emergency Use Authorization (EUA). This EUA will remain in effect (meaning this test can be used) for the duration of the COVID-19 declaration under Section 564(b)(1) of the Act, 21 U.S.C. section 360bbb-3(b)(1), unless the authorization is terminated or revoked.  Performed at [facility], [address] [place], [place] [address]   Aerobic/Anaerobic Culture w Gram Stain (surgical/deep wound)     Status: None (Preliminary result)   Collection Time: 12/09/23  2:28 PM   Specimen: Path fluid; Body Fluid  Result Value Ref Range Status   Specimen Description   Final    WOUND Performed at [facility], [address], [place], [place] [address]    Special Requests   Final    INTRA ABDOMINAL FLUID Performed at [facility], [address] [place], [place] [address]    Gram Stain   Final    FEW WBC PRESENT, PREDOMINANTLY PMN NO ORGANISMS SEEN    Culture   Final    NO GROWTH 2 DAYS NO ANAEROBES ISOLATED; CULTURE IN PROGRESS FOR 5  DAYS Performed at [facility], [address] [place], [place] [address]    Report Status PENDING  Incomplete     Radiology Studies: US  EKG SITE RITE Result Date: 12/10/2023 If Site Rite image not attached, placement could not be confirmed due to current cardiac rhythm.   Scheduled Meds:  budesonide -glycopyrrolate -formoterol   2 puff Inhalation BID   Chlorhexidine  Gluconate Cloth  6 each Topical Daily   heparin  injection (subcutaneous)  5,000 Units Subcutaneous Q8H   insulin  aspart  0-9 Units Subcutaneous Q4H   montelukast   10 mg Oral QHS   pantoprazole  (PROTONIX ) IV  80 mg Intravenous Q12H   scopolamine   1 patch Transdermal Q72H   sodium chloride  flush  10-40 mL Intracatheter Q12H   thiamine  (VITAMIN B1) injection  100 mg  Intravenous Q24H   Continuous Infusions:  piperacillin -tazobactam (ZOSYN )  IV 3.375 g (12/11/23 1224)   potassium PHOSPHATE  IVPB (in mmol) 43 mL/hr at 12/11/23 1203   promethazine  (PHENERGAN ) injection (IM or IVPB) Stopped (12/09/23 1118)   TPN ADULT (ION) 35 mL/hr at 12/11/23 1203   TPN ADULT (ION)       LOS: 4 days  MDM: Patient is high risk for one or more organ failure.  They necessitate ongoing hospitalization for continued IV therapies and subsequent lab monitoring. Total time spent interpreting labs and vitals, reviewing the medical record, coordinating care amongst consultants and care team members, directly assessing and discussing care with the patient and/or family: 55 min Dequandre Cordova, DO [facility]  To contact the attending physician between 7A-7P please use Epic Chat. To contact the covering physician during after hours 7P-7A, please review Amion.  12/11/2023, 2:49 PM   *This document has been created with the assistance of dictation software. Please excuse typographical errors. *

## 2023-12-11 NOTE — Progress Notes (Signed)
 [facility] SURGICAL PROGRESS NOTE  Hospital Day(s): 4.   Post op day(s): 2 Days Post-Op.   Interval History:  Patient seen and examined No acute events or new complaints overnight.  Patient reports she is doing alright Abdomen is sore expectedly No fever, chills, nausea, emesis Slight AKI; sCr - 0.95; UO - 700 ccs Hypokalemia to 3.2 Hypermagnesemia to 2.7 Hypophosphatemia to 1.7 NGT with 600 ccs  Surgical drain 100 ccs out; serosanguinous She is on Zosyn  She is NPO + TPN  Vital signs in last 24 hours: [min-max] current  Temp:  [98.1 F (36.7 C)-98.5 F (36.9 C)] 98.1 F (36.7 C) (09/18 0452) Pulse Rate:  [82-94] 82 (09/18 0452) Resp:  [15-16] 16 (09/18 0452) BP: (115-146)/(65-76) 130/72 (09/18 0452) SpO2:  [94 %-96 %] 96 % (09/18 0452)     Height: 5' 1 (154.9 cm) Weight: 74.8 kg BMI (Calculated): 31.17   Intake/Output last 2 shifts:  09/17 0701 - 09/18 0700 In: 668.6 [I.V.:608.6; NG/GT:60] Out: 1400 [Urine:700; Emesis/NG output:600; Drains:100]   Physical Exam:  Constitutional: alert, cooperative and no distress  HEENT: NGT in place; output dark and stagnant appearing Respiratory: breathing non-labored at rest  Cardiovascular: regular rate and sinus rhythm  Gastrointestinal: Soft, incisional soreness; distended, no rebound/guarding. Surgical drain in right abdomen; output serous  Genitourinary: Foley in place; good UO Integumentary: Laparotomy is CDI with staples   Labs:     Latest Ref Rng & Units 12/10/2023    5:05 AM 12/08/2023    5:15 AM 12/07/2023    7:22 AM  CBC  WBC 4.0 - 10.5 K/uL 8.1  9.4  11.1   Hemoglobin 12.0 - 15.0 g/dL 88.5  87.7  85.6   Hematocrit 36.0 - 46.0 % 33.8  37.6  42.9   Platelets 150 - 400 K/uL 208  251  300       Latest Ref Rng & Units 12/11/2023    4:41 AM 12/10/2023    5:05 AM 12/09/2023    5:25 AM  CMP  Glucose 70 - 99 mg/dL 836  889  883   BUN 8 - 23 mg/dL 18  16  18    Creatinine 0.44 - 1.00 mg/dL 9.04  8.79   9.09   Sodium 135 - 145 mmol/L 139  140  142   Potassium 3.5 - 5.1 mmol/L 3.2  3.8  4.2   Chloride 98 - 111 mmol/L 105  104  105   CO2 22 - 32 mmol/L 28  25  24    Calcium 8.9 - 10.3 mg/dL 8.5  8.7  9.1   Total Protein 6.5 - 8.1 g/dL 5.6     Total Bilirubin 0.0 - 1.2 mg/dL 0.6     Alkaline Phos 38 - 126 U/L 31     AST 15 - 41 U/L 22     ALT 0 - 44 U/L 20       Imaging studies: No new pertinent imaging studies   Assessment/Plan:  63 y.o. female 2 Days Post-Op s/p laparotomy, small bowel resection, and repair of jejunal ulceration just distal to gatrojejunostomy    - She will need to be STRICT NPO likely until Monday 09/22. At that time, we will likely get UGI to reassess jejunal ulceration repair in setting of previous gastric bypass to ensure no leak prior to initiating diet.   - From a nutritional perspective, NGT verified and placed beyond area of perforation intra-operatively. We could potentially use this for enteric feedings. However, she  likely has an ileus. For now, continue TPN, monitor electrolytes.   - Continue IV Abx (Zosyn ); She does have Cx from OR pending  - Continue surgical drain; monitor and record output  - Okay to discontinue foley catheter today - She needs PPI; Pantoprazole  80 mg IV BID - Monitor abdominal examination; on-going bowel function - Pain control prn; antiemetics prn - Further management per primary service; we will follow    All of the above findings and recommendations were discussed with the patient, and the medical team, and all of patient's questions were answered to her expressed satisfaction.  -- Arthea Platt, PA-C [facility] 12/11/2023, 7:55 AM M-F: 7am - 4pm

## 2023-12-11 NOTE — Consult Note (Signed)
 PHARMACY - TOTAL PARENTERAL NUTRITION CONSULT NOTE   Indication: Prolonged ileus  Patient Measurements: Height: 5' 1 (154.9 cm) Weight: 74.8 kg (164 lb 14.5 oz) IBW/kg (Calculated) : 47.8 TPN AdjBW (KG): 54.6 Body mass index is 31.16 kg/m. Usual Weight: 75.1 kg  Assessment:  63 yo F with PMH including GERD, extensive abdominal surgical history (including gastric bypass, hysterectomy, cholecystectomy) is presenting with SBO and perforation s/p resection and repair on 9/16.  Glucose / Insulin : no history of diabetes BG 106-179 3u insulin  in past 24 hours Electrolytes:  --Phos 1.7, Kphos 15 mmol IV x 1 ordered --K 3.2, Kcl 10 mEq IV x 2 + Kphos --Mg 2.7, will reduce in bag Renal: Scr 0.9>1.2>0.95 Hepatic: WNL Intake / Output; MIVF:   Intake/Output Summary (Last 24 hours) at 12/11/2023 0738 Last data filed at 12/11/2023 0500 Gross per 24 hour  Intake 668.62 ml  Output 1400 ml  Net -731.38 ml    GI Imaging: 9/16 CTAP: Persistent high-grade small bowel obstruction, likely due to adhesions, but completely small-bowel obstruction is excluded. Significant dilation of the excluded stomach from enterogastric reflux.  GI Surgeries / Procedures:  9/16 OR: 1. Laparotomy reduction of internal hernia 2. Small bowel resection with primary stapled anastomosis 3. Repair of jejunal ulceration just distal to gatrojejunostomy  Central access: PICC to be placed 9/17 TPN start date: 9/17  Nutritional Goals: Goal TPN rate is 70 mL/hr (provides 100 g of protein and 1905 kcals per day)  RD Assessment: RD consulted, communicated: 1800-2100kcal/day, 90-105g/day protein and 1.5-1.7L/day fluid, she will need thiamine  for 7 days due to history of gastric bypass  Current Nutrition:  NPO  Plan:  Continue TPN at 35 mL/hr (half-rate) at 1800 (will stay at half rate due to refeeding with 50% drop in Phos) Electrolytes in TPN: Na 67mEq/L, K 74mEq/L, Ca 51mEq/L, Mg 22mEq/L (reduced 9/18), and Phos  15mmol/L. Cl:Ac 1:1 Add standard MVI and trace elements to TPN Initiate Sensitive q4h SSI and adjust as needed Initiate thiamine  100mg  IV daily x 7 days (administering outside TPN) Monitor electrolytes the next 3 days due to refeeding risk Monitor TPN labs on Mon/Thurs  Kayla Mose Niels DOUGLAS, PharmD, BCPS Clinical Pharmacist 12/11/2023 7:34 AM

## 2023-12-11 NOTE — Plan of Care (Signed)
   Problem: Education: Goal: Knowledge of General Education information will improve Description Including pain rating scale, medication(s)/side effects and non-pharmacologic comfort measures Outcome: Progressing

## 2023-12-12 DIAGNOSIS — K56609 Unspecified intestinal obstruction, unspecified as to partial versus complete obstruction: Secondary | ICD-10-CM | POA: Diagnosis not present

## 2023-12-12 LAB — GLUCOSE, CAPILLARY
Glucose-Capillary: 124 mg/dL — ABNORMAL HIGH (ref 70–99)
Glucose-Capillary: 111 mg/dL — ABNORMAL HIGH (ref 70–99)
Glucose-Capillary: 130 mg/dL — ABNORMAL HIGH (ref 70–99)
Glucose-Capillary: 113 mg/dL — ABNORMAL HIGH (ref 70–99)
Glucose-Capillary: 79 mg/dL (ref 70–99)
Glucose-Capillary: 114 mg/dL — ABNORMAL HIGH (ref 70–99)

## 2023-12-12 LAB — BASIC METABOLIC PANEL WITH GFR
Anion gap: 8 (ref 5–15)
BUN: 20 mg/dL (ref 8–23)
CO2: 28 mmol/L (ref 22–32)
Calcium: 8.5 mg/dL — ABNORMAL LOW (ref 8.9–10.3)
Chloride: 104 mmol/L (ref 98–111)
Creatinine, Ser: 0.75 mg/dL (ref 0.44–1.00)
GFR, Estimated: >60 mL/min (ref >60)
Glucose, Bld: 110 mg/dL — ABNORMAL HIGH (ref 70–99)
Potassium: 3.4 mmol/L — ABNORMAL LOW (ref 3.5–5.1)
Sodium: 140 mmol/L (ref 135–145)

## 2023-12-12 LAB — PHOSPHORUS: Phosphorus: 2 mg/dL — ABNORMAL LOW (ref 2.5–4.6)

## 2023-12-12 LAB — VITAMIN B1: Vitamin B1 (Thiamine): 119.3 nmol/L (ref 66.5–200.0)

## 2023-12-12 LAB — TRIGLYCERIDES: Triglycerides: 103 mg/dL (ref <150)

## 2023-12-12 LAB — MAGNESIUM: Magnesium: 2.2 mg/dL (ref 1.7–2.4)

## 2023-12-12 LAB — COPPER, SERUM: Copper: 45 ug/dL — ABNORMAL LOW (ref 80–158)

## 2023-12-12 MED ORDER — POTASSIUM PHOSPHATES 15 MMOLE/5ML IV SOLN
21.0000 mmol | Freq: Once | INTRAVENOUS | Status: AC
  Administered 2023-12-12: 21 mmol via INTRAVENOUS
  Filled 2023-12-12: qty 7

## 2023-12-12 MED ORDER — TRACE MINERALS CU-MN-SE-ZN 300-55-60-3000 MCG/ML IV SOLN
INTRAVENOUS | Status: AC
  Filled 2023-12-12: qty 480

## 2023-12-12 MED ORDER — DEXTROSE 5 % IV SOLN
4.0000 mg | Freq: Every day | INTRAVENOUS | Status: AC
  Administered 2023-12-12 – 2023-12-18 (×7): 4 mg via INTRAVENOUS
  Filled 2023-12-12 (×7): qty 10

## 2023-12-12 MED ORDER — POTASSIUM CHLORIDE 10 MEQ/100ML IV SOLN
10.0000 meq | INTRAVENOUS | Status: AC
Start: 2023-12-12 — End: 2023-12-12
  Administered 2023-12-12 (×2): 10 meq via INTRAVENOUS
  Filled 2023-12-12: qty 100

## 2023-12-12 MED ORDER — ACETAMINOPHEN 10 MG/ML IV SOLN
1000.0000 mg | Freq: Four times a day (QID) | INTRAVENOUS | Status: AC
Start: 2023-12-12 — End: 2023-12-13
  Administered 2023-12-12 – 2023-12-13 (×3): 1000 mg via INTRAVENOUS
  Filled 2023-12-12 (×4): qty 100

## 2023-12-12 NOTE — Progress Notes (Signed)
 PROGRESS NOTE    Wealthy Sandra Chung [facility]  FMW:[id number] DOB: [birth year] DOA: 12/07/2023 PCP: Jyl Railing, MD  Chief Complaint  Patient presents with   Abdominal Pain    Hospital Course:  Sandra Chung 63 year old female with GERD, COPD, multiple prior abdominal surgeries including gastric bypass, hysterectomy, cholecystectomy, presented to the hospital with abdominal pain for 2 days with accompanying nausea and vomiting.  She was found to have an SBO and was admitted. She was initially managed conservatively with NG tube and was having bowel movements as well as passing a Gastrografin  challenge however KUB continue to reveal obstructive pattern and she had significant pain.  Repeat CT abdomen pelvis 9/16 revealed NG tube appeared to have perforated anterior wall of jejunum.  Patient went for urgent exploratory laparotomy on 9/16.  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  Subjective: No acute events overnight.  Patient is anxious to begin trying some ice chips when she is allowed.  She does report she had a bowel movement this morning.  Objective: Vitals:   12/11/23 1439 12/11/23 1959 12/12/23 0423 12/12/23 0832  BP: 138/85 135/86 138/80 131/85  Pulse: 92 87 79 86  Resp: 18 16 16 16   Temp: 98.9 F (37.2 C) 98.5 F (36.9 C) 98.3 F (36.8 C) 99.5 F (37.5 C)  TempSrc: Oral Oral Oral Oral  SpO2: 97% 98% 96% 97%  Weight:      Height:        Intake/Output Summary (Last 24 hours) at 12/12/2023 1723 Last data filed at 12/12/2023 1632 Gross per 24 hour  Intake 1494.12 ml  Output 570 ml  Net 924.12 ml   Filed Weights   12/07/23 0721 12/07/23 1200  Weight: 74.8 kg 74.8 kg    Examination: General exam: Appears calm and comfortable, NAD  Respiratory system: No work of breathing, symmetric chest wall expansion Cardiovascular system: S1 & S2 heard, RRR.   Gastrointestinal system: Abdomen is distended, abdominal binder in place, surgical site is well-healing, no oozing or bleeding at this time.  Right-sided abdominal drain with serosanguineous fluid in bulb.  NG tube remains in place.  Abdomen is appropriately tender to palpation.  NG canister with dark green liquid. Neuro: Alert and oriented. No focal neurological deficits. Extremities: Symmetric, expected ROM Skin: No rashes, lesions Psychiatry: Demonstrates appropriate judgement and insight. Mood & affect appropriate for situation.   Assessment & Plan:  Principal Problem:   Small bowel obstruction (HCC) Active Problems:   Perforated ulcer of intestine (HCC)   Small bowel obstruction with perforation - Ex lap 9/16: Laparotomy reduction of internal hernia, small bowel resection with primary stapled anastomosis, repair of jejunal ulceration just distal to gastrojejunostomy - NG tube in place, and placed intraoperatively reportedly past area of perforation - General Surgery recommending strict n.p.o. until Monday 9/22.  Will need upper GI at that time to reassess jejunal ulceration repair in setting of previous gastric bypass to ensure no leak - Remains high risk for ileus. - Continue with TPN - If bowel function returns we can consider enteric feeds via NG but will do so slowly - Continue with Zosyn  - Follow OR cultures - Abdominal binder and honeycomb for patient comfort - Continue high-dose PPI - Out of bed and mobilization is much as she can tolerate - PT/OT consulted, will determine home health needs when closer to discharge. - Continue to monitor surgical drain output.  COPD - Continue  home dose bronchodilators - Has some increased mucus production - Medications somewhat limited as patient is strict n.p.o. - Inhalers as needed  BMI 31 Obesity class I - Outpatient follow up for lifestyle modification and risk factor management  DVT prophylaxis: Subcu heparin  for now.   Code  Status: Full Code Disposition: Inpatient pending significant clinical resolution.  Currently on TPN, and NGT  Consultants:  Treatment Team:  Consulting Physician: Marinda Jayson KIDD, MD  Procedures:   Ex lap 9/16  Antimicrobials:  Anti-infectives (From admission, onward)    Start     Dose/Rate Route Frequency Ordered Stop   12/09/23 2300  piperacillin -tazobactam (ZOSYN ) IVPB 3.375 g        3.375 g 12.5 mL/hr over 240 Minutes Intravenous Every 8 hours 12/09/23 1641     12/09/23 1445  piperacillin -tazobactam (ZOSYN ) IVPB 3.375 g        3.375 g 100 mL/hr over 30 Minutes Intravenous  Once 12/09/23 1440 12/09/23 1350   12/09/23 1330  cefoTEtan  (CEFOTAN ) 2 g in sodium chloride  0.9 % 100 mL IVPB        2 g 200 mL/hr over 30 Minutes Intravenous  Once 12/09/23 1316 12/09/23 1524       Data Reviewed: I have personally reviewed following labs and imaging studies CBC: Recent Labs  Lab 12/07/23 0722 12/08/23 0515 12/10/23 0505  WBC 11.1* 9.4 8.1  HGB 14.3 12.2 11.4*  HCT 42.9 37.6 33.8*  MCV 94.7 97.4 94.7  PLT 300 251 208   Basic Metabolic Panel: Recent Labs  Lab 12/08/23 0515 12/09/23 0525 12/10/23 0505 12/11/23 0441 12/12/23 0559  NA 141 142 140 139 140  K 3.9 4.2 3.8 3.2* 3.4*  CL 109 105 104 105 104  CO2 26 24 25 28 28   GLUCOSE 84 116* 110* 163* 110*  BUN 15 18 16 18 20   CREATININE 0.76 0.90 1.20* 0.95 0.75  CALCIUM 8.7* 9.1 8.7* 8.5* 8.5*  MG  --  1.7 1.6* 2.7* 2.2  PHOS  --  2.9 3.5 1.7* 2.0*   GFR: Estimated Creatinine Clearance: 65.7 mL/min (by C-G formula based on SCr of 0.75 mg/dL). Liver Function Tests: Recent Labs  Lab 12/07/23 0722 12/11/23 0441  AST 23 22  ALT 19 20  ALKPHOS 98 31*  BILITOT 0.9 0.6  PROT 6.8 5.6*  ALBUMIN  4.0 3.1*   CBG: Recent Labs  Lab 12/12/23 0128 12/12/23 0416 12/12/23 0834 12/12/23 1240 12/12/23 1622  GLUCAP 124* 111* 130* 113* 79    Recent Results (from the past 240 hours)  Resp panel by RT-PCR (RSV, Flu A&B,  Covid) Anterior Nasal Swab     Status: None   Collection Time: 12/07/23  9:05 AM   Specimen: Anterior Nasal Swab  Result Value Ref Range Status   SARS Coronavirus 2 by RT PCR NEGATIVE NEGATIVE Final    Comment: (NOTE) SARS-CoV-2 target nucleic acids are NOT DETECTED.  The SARS-CoV-2 RNA is generally detectable in upper respiratory specimens during the acute phase of infection. The lowest concentration of SARS-CoV-2 viral copies this assay can detect is 138 copies/mL. A negative result does not preclude SARS-Cov-2 infection and should not be used as the sole basis for treatment or other patient management decisions. A negative result may occur with  improper specimen collection/handling, submission of specimen other than nasopharyngeal swab, presence of viral mutation(s) within the areas targeted by this assay, and inadequate number of viral copies(<138 copies/mL). A negative result must be combined with clinical observations, patient history,  and epidemiological information. The expected result is Negative.  Fact Sheet for Patients:  BloggerCourse.com  Fact Sheet for Healthcare Providers:  SeriousBroker.it  This test is no t yet approved or cleared by the [place]  FDA and  has been authorized for detection and/or diagnosis of SARS-CoV-2 by FDA under an Emergency Use Authorization (EUA). This EUA will remain  in effect (meaning this test can be used) for the duration of the COVID-19 declaration under Section 564(b)(1) of the Act, 21 U.S.C.section 360bbb-3(b)(1), unless the authorization is terminated  or revoked sooner.       Influenza A by PCR NEGATIVE NEGATIVE Final   Influenza B by PCR NEGATIVE NEGATIVE Final    Comment: (NOTE) The Xpert Xpress SARS-CoV-2/FLU/RSV plus assay is intended as an aid in the diagnosis of influenza from Nasopharyngeal swab specimens and should not be used as a sole basis for treatment. Nasal  washings and aspirates are unacceptable for Xpert Xpress SARS-CoV-2/FLU/RSV testing.  Fact Sheet for Patients: BloggerCourse.com  Fact Sheet for Healthcare Providers: SeriousBroker.it  This test is not yet approved or cleared by the [place]  FDA and has been authorized for detection and/or diagnosis of SARS-CoV-2 by FDA under an Emergency Use Authorization (EUA). This EUA will remain in effect (meaning this test can be used) for the duration of the COVID-19 declaration under Section 564(b)(1) of the Act, 21 U.S.C. section 360bbb-3(b)(1), unless the authorization is terminated or revoked.     Resp Syncytial Virus by PCR NEGATIVE NEGATIVE Final    Comment: (NOTE) Fact Sheet for Patients: BloggerCourse.com  Fact Sheet for Healthcare Providers: SeriousBroker.it  This test is not yet approved or cleared by the [place]  FDA and has been authorized for detection and/or diagnosis of SARS-CoV-2 by FDA under an Emergency Use Authorization (EUA). This EUA will remain in effect (meaning this test can be used) for the duration of the COVID-19 declaration under Section 564(b)(1) of the Act, 21 U.S.C. section 360bbb-3(b)(1), unless the authorization is terminated or revoked.  Performed at [facility], [address] [place], [place] [address]   Aerobic/Anaerobic Culture w Gram Stain (surgical/deep wound)     Status: None (Preliminary result)   Collection Time: 12/09/23  2:28 PM   Specimen: Path fluid; Body Fluid  Result Value Ref Range Status   Specimen Description   Final    WOUND Performed at [facility], [address] [place], [place] [address]    Special Requests   Final    INTRA ABDOMINAL FLUID Performed at [facility], [address] [place], [place] [address]    Gram Stain   Final    FEW WBC PRESENT, PREDOMINANTLY PMN NO ORGANISMS  SEEN    Culture   Final    NO GROWTH 3 DAYS NO ANAEROBES ISOLATED; CULTURE IN PROGRESS FOR 5 DAYS Performed at [facility], [address] [place], [place] [address]    Report Status PENDING  Incomplete     Radiology Studies: No results found.   Scheduled Meds:  budesonide -glycopyrrolate -formoterol   2 puff Inhalation BID   Chlorhexidine  Gluconate Cloth  6 each Topical Daily   cyanocobalamin   1,000 mcg Intramuscular Daily   heparin  injection (subcutaneous)  5,000 Units Subcutaneous Q8H   insulin  aspart  0-9 Units Subcutaneous Q4H   montelukast   10 mg Oral QHS   pantoprazole  (PROTONIX ) IV  80 mg Intravenous Q12H   scopolamine   1 patch Transdermal Q72H   sodium chloride  flush  10-40 mL Intracatheter  Q12H   thiamine  (VITAMIN B1) injection  100 mg Intravenous Q24H   Continuous Infusions:  copper  chloride 4 mg in dextrose  5 % 100 mL IVPB 4 mg (12/12/23 1705)   piperacillin -tazobactam (ZOSYN )  IV 12.5 mL/hr at 12/12/23 1632   promethazine  (PHENERGAN ) injection (IM or IVPB) Stopped (12/09/23 1118)   TPN ADULT (ION) 35 mL/hr at 12/12/23 1632   TPN ADULT (ION)       LOS: 5 days  MDM: Patient is high risk for one or more organ failure.  They necessitate ongoing hospitalization for continued IV therapies and subsequent lab monitoring. Total time spent interpreting labs and vitals, reviewing the medical record, coordinating care amongst consultants and care team members, directly assessing and discussing care with the patient and/or family: 55 min Derra Shartzer, DO [facility]  To contact the attending physician between 7A-7P please use Epic Chat. To contact the covering physician during after hours 7P-7A, please review Amion.  12/12/2023, 5:23 PM   *This document has been created with the assistance of dictation software. Please excuse typographical errors. *

## 2023-12-12 NOTE — Progress Notes (Signed)
 Occupational Therapy Treatment Patient Details Name: Sandra Chung MRN: [id number] DOB: 10-22-1960 Today's Date: 12/12/2023   History of present illness Sandra Chung is a 63 y.o. 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 Presents with small bowel obstruction.  9/16 revealed NG tube appeared to have perforated anterior wall of jejunum. S/p laparotomy, small bowel resection, and repair of jejunal ulceration just distal to gatrojejunostomy on 12/09/23   OT comments  Pt seen for OT treatment on this date. Upon arrival to room pt supine in bed with HOB elevated, agreeable to tx. Pt requires cuing for safety awareness during bed mobility, functional transfers, and standing components of ADLs at RW level specifically for hand placement, management of RW, and technique.  Education for energy conservation and Activity Pacing techniques to promote overall safety awareness and reduce risk of falls. Pt making good progress toward goals, will continue to follow POC. Discharge recommendation remains appropriate.        If plan is discharge home, recommend the following:  A little help with walking and/or transfers;A lot of help with bathing/dressing/bathroom;Help with stairs or ramp for entrance   Equipment Recommendations  BSC/3in1    Recommendations for Other Services      Precautions / Restrictions Precautions Precautions: None Recall of Precautions/Restrictions: Intact Restrictions Weight Bearing Restrictions Per Provider Order: No       Mobility Bed Mobility Overal bed mobility: Needs Assistance Bed Mobility: Supine to Sit     Supine to sit: Contact guard, Used rails, HOB elevated          Transfers Overall transfer level: Needs assistance Equipment used: Rolling walker (2 wheels) Transfers: Sit to/from Stand Sit to Stand:  Supervision, Contact guard assist           General transfer comment: Verbal cuing for safety awareness throughout functional transfers and standing components of ADLs.     Balance Overall balance assessment: Needs assistance Sitting-balance support: Feet supported Sitting balance-Leahy Scale: Good     Standing balance support: Single extremity supported, Bilateral upper extremity supported, No upper extremity supported, During functional activity Standing balance-Leahy Scale: Fair Standing balance comment: Verbal cuing for safety awareness throughout functional transfers and standing components of ADLs.                           ADL either performed or assessed with clinical judgement   ADL Overall ADL's : Needs assistance/impaired     Grooming: Wash/dry hands;Wash/dry face;Oral care;Contact guard assist;Supervision/safety;Standing;Cueing for safety                   Toilet Transfer: Supervision/safety;Rolling walker (2 wheels);Regular Toilet;Grab bars   Toileting- Clothing Manipulation and Hygiene: Supervision/safety;Sit to/from stand;Cueing for safety              Extremity/Trunk Assessment Upper Extremity Assessment Upper Extremity Assessment: Generalized weakness            Vision Patient Visual Report: No change from baseline     Perception     Praxis     Communication Communication Communication: No apparent difficulties   Cognition Arousal: Alert Behavior During Therapy: WFL for tasks assessed/performed Cognition: No apparent impairments  Following commands: Intact        Cueing   Cueing Techniques: Verbal cues, Visual cues  Exercises Other Exercises Other Exercises: Education on safety awareness for standing components of ADLs and functional transfers Other Exercises: Education on Energy Conservation Techniques during ADL task engagement along with Activity Pacing    Shoulder  Instructions       General Comments      Pertinent Vitals/ Pain       Pain Assessment Pain Assessment: No/denies pain  Home Living                                          Prior Functioning/Environment              Frequency  Min 2X/week        Progress Toward Goals  OT Goals(current goals can now be found in the care plan section)  Progress towards OT goals: Progressing toward goals  Acute Rehab OT Goals Potential to Achieve Goals: Good  Plan      Co-evaluation                 AM-PAC OT 6 Clicks Daily Activity     Outcome Measure   Help from another person eating meals?: None Help from another person taking care of personal grooming?: A Little Help from another person toileting, which includes using toliet, bedpan, or urinal?: A Little Help from another person bathing (including washing, rinsing, drying)?: A Lot Help from another person to put on and taking off regular upper body clothing?: A Little Help from another person to put on and taking off regular lower body clothing?: A Lot 6 Click Score: 17    End of Session Equipment Utilized During Treatment: Rolling walker (2 wheels)  OT Visit Diagnosis: Unsteadiness on feet (R26.81)   Activity Tolerance Patient tolerated treatment well   Patient Left in bed;with call bell/phone within reach;with bed alarm set;with family/visitor present   Nurse Communication Mobility status        Time: 8557-8478 OT Time Calculation (min): 39 min  Charges: OT General Charges $OT Visit: 1 Visit OT Treatments $Self Care/Home Management : 38-52 mins  Harlene Sharps OTR/L   Harlene LITTIE Sharps 12/12/2023, 3:42 PM

## 2023-12-12 NOTE — Consult Note (Signed)
 PHARMACY - TOTAL PARENTERAL NUTRITION CONSULT NOTE   Indication: Prolonged ileus  Patient Measurements: Height: 5' 1 (154.9 cm) Weight: 74.8 kg (164 lb 14.5 oz) IBW/kg (Calculated) : 47.8 TPN AdjBW (KG): 54.6 Body mass index is 31.16 kg/m. Usual Weight: 75.1 kg  Assessment:  63 yo F with PMH including GERD, extensive abdominal surgical history (including gastric bypass, hysterectomy, cholecystectomy) is presenting with SBO and perforation s/p resection and repair on 9/16.  Glucose / Insulin : no history of diabetes BG 110-142 4u insulin  in past 24 hours Electrolytes:  --Phos 2.0, Kphos 21 mmol IV x 1 ordered --K 3.4, Kcl 10 mEq IV x 2 + Kphos Renal: Scr 0.9>1.2>0.95 Hepatic: WNL Intake / Output; MIVF:   Intake/Output Summary (Last 24 hours) at 12/12/2023 0714 Last data filed at 12/12/2023 0620 Gross per 24 hour  Intake 1590.38 ml  Output 850 ml  Net 740.38 ml    GI Imaging: 9/16 CTAP: Persistent high-grade small bowel obstruction, likely due to adhesions, but completely small-bowel obstruction is excluded. Significant dilation of the excluded stomach from enterogastric reflux.  GI Surgeries / Procedures:  9/16 OR: 1. Laparotomy reduction of internal hernia 2. Small bowel resection with primary stapled anastomosis 3. Repair of jejunal ulceration just distal to gatrojejunostomy  Central access: PICC to be placed 9/17 TPN start date: 9/17  Nutritional Goals: Goal TPN rate is 70 mL/hr (provides 100 g of protein and 1905 kcals per day)  RD Assessment: RD consulted, communicated: 1800-2100kcal/day, 90-105g/day protein and 1.5-1.7L/day fluid, she will need thiamine  for 7 days due to history of gastric bypass  Current Nutrition:  NPO  Plan:  Increase TPN to 50 mL/hr at 1800 Electrolytes in TPN: Na 77mEq/L, K 88mEq/L, Ca 29mEq/L, Mg 72mEq/L, and Phos 15mmol/L. Cl:Ac 1:1 Replacing Phos and K outside of bag Add standard MVI and trace elements to TPN Initiate Sensitive  q4h SSI and adjust as needed Initiate thiamine  100mg  IV daily x 7 days (administering outside TPN) Monitor electrolytes the next 3 days due to refeeding risk Monitor TPN labs on Mon/Thurs  Kayla Mose Niels DOUGLAS, PharmD, BCPS Clinical Pharmacist 12/12/2023 7:14 AM

## 2023-12-12 NOTE — Progress Notes (Addendum)
 Nutrition Follow Up Note   DOCUMENTATION CODES:   Obesity unspecified  INTERVENTION:   Continue TPN per pharmacy- provides 1905kcal/day and 100g/day protein   Continue thiamine  100mg  IV daily x 7 days   Pt remains at high refeed risk; recommend monitor potassium, magnesium  and phosphorus labs daily until stable  Daily weights   Vitamins A and E labs pending.   Copper  chloride 4mg  IV daily x 7 days, followed by copper  8mg  po daily x 30 days   Cyanocobalamin  1,057mcg IM daily for 7 days   Zinc  5mg  daily added to TPN  Ergocalciferol  50,000 units po weekly x 6 weeks with diet advancement   Zinc  220mg  po daily x 30 days once TPN discontinued- Give with food  NUTRITION DIAGNOSIS:   Inadequate oral intake related to altered GI function as evidenced by NPO status. -ongoing   GOAL:   Patient will meet greater than or equal to 90% of their needs -progressing   MONITOR:   Diet advancement, Labs, Weight trends, Skin, I & O's, Other (Comment) (TPN)  ASSESSMENT:   63 y/o female with h/o asthma, COPD, OSA, depression, GERD, HTN, HLD, remote cocaine use, hiatal hernia, chronic pain, SIBO, HCV, gonorrhea, gastric ulcer secondary to H. pylori, rectocele repair s/p bladder sling, s/p hysterectomy, s/p cholecystectomy, morbid obesity s/p roux-en-y gastric bypass (2010), s/p diagnostic laparoscopy with LOA and tightening of mesenteric defects (2012), lymphedema, TIA and internal hernia who is admitted with peritonitis, SBO secondary to internal hernia & adhesions and perforated ulcer now s/p laparotomy with LOA, reduction of internal hernia, small bowel resection (~10cm) with primary stapled anastomosis & repair of jejunal ulceration (located just distal to gatrojejunostomy) 9/16.  Pt tolerating TPN; plan is to advance to goal rate tonight. Pt is actively refeeding; electrolytes being monitored and supplemented as needed. Pt with numerous vitamin deficiencies; RD will supplement as needed.  Pt remains NPO. NGT in place with output. Per chart, pt is weight stable since admission.   Medications reviewed and include: heparin , insulin , protonix , scopolamine , thiamine , KPhos, zosyn , TPN   Labs reviewed: K 3.4(L), P 2.0(L), Mg 2.2 wnl  Vitamin D - 27.04(L), folate 7.6 wnl, B12 <150(L), B1 119.3 wnl, copper  45(L), Zinc  15(L)- 9/17 Cbgs- 113, 130, 111, 124 x 24 hrs   UOP-   Drains- 90ml   Diet Order:   Diet Order             Diet NPO time specified  Diet effective now                  EDUCATION NEEDS:   Education needs have been addressed  Skin:  Skin Assessment: Reviewed RN Assessment (incision abdomen)  Last BM:  9/16- type 5  Height:   Ht Readings from Last 1 Encounters:  12/07/23 5' 1 (1.549 m)    Weight:   Wt Readings from Last 1 Encounters:  12/07/23 74.8 kg    Ideal Body Weight:  47.7 kg  BMI:  Body mass index is 31.16 kg/m.  Estimated Nutritional Needs:   Kcal:  1800-2100kcal/day  Protein:  90-105g/day  Fluid:  1.5-1.7L/day  Augustin Shams MS, RD, LDN If unable to be reached, please send secure chat to RD inpatient available from 8:00a-4:00p daily

## 2023-12-12 NOTE — TOC Initial Note (Signed)
 Transition of Care [facility]) - Initial/Assessment Note    Patient Details  Name: Sandra Chung MRN: [id number] Date of Birth: January 06, 1961  Transition of Care [facility]) CM/SW Contact:    Lauraine JAYSON Carpen, LCSW Phone Number: 12/12/2023, 12:11 PM  Clinical Narrative:   CSW met with patient. No family at bedside. CSW introduced role and explained that therapy recommendations would be discussed. Patient is not interested in home health at this time. She said she just moved and there are boxes everywhere. CSW encouraged her to notify team if she changes her mind while still hospitalized or her PCP if she changes her mind after discharge. DME recommendations for RW and 3-in-1. Patient stated she already has a rollator at home. She will consider 3-in-1. No further concerns. CSW will continue to follow patient for support and facilitate return home when stable. Her mother and/or son will pick her up at discharge.               Expected Discharge Plan: Home/Self Care Barriers to Discharge: Continued Medical Work up   Patient Goals and CMS Choice            Expected Discharge Plan and Services     Post Acute Care Choice: Durable Medical Equipment Living arrangements for the past 2 months: Single Family Home                                      Prior Living Arrangements/Services Living arrangements for the past 2 months: Single Family Home   Patient language and need for interpreter reviewed:: Yes Do you feel safe going back to the place where you live?: Yes      Need for Family Participation in Patient Care: Yes (Comment) Care giver support system in place?: Yes (comment) Current home services: DME Criminal Activity/Legal Involvement Pertinent to Current Situation/Hospitalization: No - Comment as needed  Activities of Daily Living   ADL Screening (condition at time of admission) Independently performs ADLs?: Yes (appropriate for developmental age) Is the patient deaf or have  difficulty hearing?: No Does the patient have difficulty seeing, even when wearing glasses/contacts?: No Does the patient have difficulty concentrating, remembering, or making decisions?: No  Permission Sought/Granted                  Emotional Assessment Appearance:: Appears stated age Attitude/Demeanor/Rapport: Engaged Affect (typically observed): Appropriate, Calm, Pleasant Orientation: : Oriented to Self, Oriented to Place, Oriented to  Time, Oriented to Situation Alcohol / Substance Use: Not Applicable Psych Involvement: No (comment)  Admission diagnosis:  Small bowel obstruction (HCC) [K56.609] SBO (small bowel obstruction) (HCC) [K56.609] Patient Active Problem List   Diagnosis Date Noted   Perforated ulcer of intestine (HCC) 12/09/2023   Small bowel obstruction (HCC) 12/07/2023   PCP:  Jyl Railing, MD Pharmacy:  No Pharmacies Listed    Social Drivers of Health (SDOH) Social History: SDOH Screenings   Food Insecurity: No Food Insecurity (12/07/2023)  Recent Concern: Food Insecurity - Food Insecurity Present (11/11/2023)   Received from [facility]  Housing: Low Risk  (12/07/2023)  Transportation Needs: No Transportation Needs (12/07/2023)  Utilities: Not At Risk (12/07/2023)  Recent Concern: Utilities - At Risk (11/11/2023)   Received from [facility]  Financial Resource Strain: Medium Risk (11/11/2023)   Received from [facility]  Tobacco Use: Low Risk  (12/07/2023)   SDOH Interventions:  Readmission Risk Interventions     No data to display

## 2023-12-12 NOTE — Progress Notes (Signed)
 Mobility Specialist - Progress Note   12/12/23 0913  Mobility  Activity Ambulated with assistance  Level of Assistance Contact guard assist, steadying assist  Assistive Device None (pushing IV pole)  Distance Ambulated (ft) 12 ft  Activity Response Tolerated well  Mobility visit 1 Mobility  Mobility Specialist Start Time (ACUTE ONLY) 0902  Mobility Specialist Stop Time (ACUTE ONLY) 0910  Mobility Specialist Time Calculation (min) (ACUTE ONLY) 8 min   Pt sitting on the commode upon entry, utilizing RA. Pt required MaxA peri care. Pt STS MinG, amb to bed CGA-MinG-- tolerated well. Pt left supine with alarm set and needs within reach.  America Silvan Mobility Specialist 12/12/23 9:24 AM

## 2023-12-12 NOTE — Progress Notes (Signed)
 [facility] SURGICAL PROGRESS NOTE  Hospital Day(s): 5.   Post op day(s): 3 Days Post-Op.   Interval History:  Patient seen and examined No acute events or new complaints overnight.  Patient reports she is doing alright Abdomen is sore expectedly No fever, chills, nausea, emesis Slight AKI; sCr - 0.75; UO - 510 ccs Hypokalemia to 3.4 Hypophosphatemia to 2.0 NGT with 250 ccs  Surgical drain 90 ccs out; serosanguinous She is on Zosyn  She is NPO + TPN No flatus  Vital signs in last 24 hours: [min-max] current  Temp:  [98.3 F (36.8 C)-98.9 F (37.2 C)] 98.3 F (36.8 C) (09/19 0423) Pulse Rate:  [79-92] 79 (09/19 0423) Resp:  [16-18] 16 (09/19 0423) BP: (135-138)/(80-86) 138/80 (09/19 0423) SpO2:  [96 %-98 %] 96 % (09/19 0423)     Height: 5' 1 (154.9 cm) Weight: 74.8 kg BMI (Calculated): 31.17   Intake/Output last 2 shifts:  09/18 0701 - 09/19 0700 In: 1590.4 [I.V.:799.9; NG/GT:90; IV Piggyback:700.5] Out: 850 [Urine:510; Emesis/NG output:250; Drains:90]   Physical Exam:  Constitutional: alert, cooperative and no distress  HEENT: NGT in place; output dark and stagnant appearing Respiratory: breathing non-labored at rest  Cardiovascular: regular rate and sinus rhythm  Gastrointestinal: Soft, incisional soreness; distended, no rebound/guarding. Surgical drain in right abdomen; output serous  Integumentary: Laparotomy is CDI with staples; honeycomb in place  Labs:     Latest Ref Rng & Units 12/10/2023    5:05 AM 12/08/2023    5:15 AM 12/07/2023    7:22 AM  CBC  WBC 4.0 - 10.5 K/uL 8.1  9.4  11.1   Hemoglobin 12.0 - 15.0 g/dL 88.5  87.7  85.6   Hematocrit 36.0 - 46.0 % 33.8  37.6  42.9   Platelets 150 - 400 K/uL 208  251  300       Latest Ref Rng & Units 12/12/2023    5:59 AM 12/11/2023    4:41 AM 12/10/2023    5:05 AM  CMP  Glucose 70 - 99 mg/dL 889  836  889   BUN 8 - 23 mg/dL 20  18  16    Creatinine 0.44 - 1.00 mg/dL 9.24  9.04  8.79   Sodium  135 - 145 mmol/L 140  139  140   Potassium 3.5 - 5.1 mmol/L 3.4  3.2  3.8   Chloride 98 - 111 mmol/L 104  105  104   CO2 22 - 32 mmol/L 28  28  25    Calcium 8.9 - 10.3 mg/dL 8.5  8.5  8.7   Total Protein 6.5 - 8.1 g/dL  5.6    Total Bilirubin 0.0 - 1.2 mg/dL  0.6    Alkaline Phos 38 - 126 U/L  31    AST 15 - 41 U/L  22    ALT 0 - 44 U/L  20      Imaging studies: No new pertinent imaging studies   Assessment/Plan:  63 y.o. female 3 Days Post-Op s/p laparotomy, small bowel resection, and repair of jejunal ulceration just distal to gatrojejunostomy    - She will need to be STRICT NPO likely until Monday 09/22. At that time, we will likely get UGI to reassess jejunal ulceration repair in setting of previous gastric bypass to ensure no leak prior to initiating diet. I have ordered UGI for Monday   - From a nutritional perspective, NGT verified and placed beyond area of perforation intra-operatively. We could potentially use this for enteric  feedings. However, she likely has an ileus. For now, continue TPN, monitor electrolytes. If bowel function returns over the weekend, can transition to enteric feeds  - Replete electrolytes; monitor  - Continue IV Abx (Zosyn ); She does have Cx from OR pending  - Okay to remove honeycomb at any time; left in place for comfort  - Continue surgical drain; monitor and record output - She needs PPI; Pantoprazole  80 mg IV BID - Monitor abdominal examination; on-going bowel function - Pain control prn; antiemetics prn  - Mobilize; she has done multiple laps; sitting in chair during the day - Further management per primary service; we will follow    All of the above findings and recommendations were discussed with the patient, and the medical team, and all of patient's questions were answered to her expressed satisfaction.  -- Arthea Platt, PA-C [facility] 12/12/2023, 7:33 AM M-F: 7am - 4pm

## 2023-12-12 NOTE — Plan of Care (Signed)
   Problem: Education: Goal: Knowledge of General Education information will improve Description Including pain rating scale, medication(s)/side effects and non-pharmacologic comfort measures Outcome: Progressing

## 2023-12-13 DIAGNOSIS — K56609 Unspecified intestinal obstruction, unspecified as to partial versus complete obstruction: Secondary | ICD-10-CM | POA: Diagnosis not present

## 2023-12-13 LAB — COMPREHENSIVE METABOLIC PANEL WITH GFR
ALT: 34 U/L (ref 0–44)
AST: 43 U/L — ABNORMAL HIGH (ref 15–41)
Albumin: 2.7 g/dL — ABNORMAL LOW (ref 3.5–5.0)
Alkaline Phosphatase: 49 U/L (ref 38–126)
Anion gap: 8 (ref 5–15)
BUN: 18 mg/dL (ref 8–23)
CO2: 25 mmol/L (ref 22–32)
Calcium: 8.9 mg/dL (ref 8.9–10.3)
Chloride: 108 mmol/L (ref 98–111)
Creatinine, Ser: 0.58 mg/dL (ref 0.44–1.00)
GFR, Estimated: >60 mL/min (ref >60)
Glucose, Bld: 105 mg/dL — ABNORMAL HIGH (ref 70–99)
Potassium: 4.4 mmol/L (ref 3.5–5.1)
Sodium: 141 mmol/L (ref 135–145)
Total Bilirubin: 0.8 mg/dL (ref 0.0–1.2)
Total Protein: 5.8 g/dL — ABNORMAL LOW (ref 6.5–8.1)

## 2023-12-13 LAB — GLUCOSE, CAPILLARY
Glucose-Capillary: 104 mg/dL — ABNORMAL HIGH (ref 70–99)
Glucose-Capillary: 127 mg/dL — ABNORMAL HIGH (ref 70–99)
Glucose-Capillary: 127 mg/dL — ABNORMAL HIGH (ref 70–99)
Glucose-Capillary: 128 mg/dL — ABNORMAL HIGH (ref 70–99)
Glucose-Capillary: 129 mg/dL — ABNORMAL HIGH (ref 70–99)
Glucose-Capillary: 96 mg/dL (ref 70–99)

## 2023-12-13 LAB — CBC WITH DIFFERENTIAL/PLATELET
Abs Immature Granulocytes: 0.09 K/uL — ABNORMAL HIGH (ref 0.00–0.07)
Basophils Absolute: 0 K/uL (ref 0.0–0.1)
Basophils Relative: 0 %
Eosinophils Absolute: 0.7 K/uL — ABNORMAL HIGH (ref 0.0–0.5)
Eosinophils Relative: 8 %
HCT: 29.6 % — ABNORMAL LOW (ref 36.0–46.0)
Hemoglobin: 9.9 g/dL — ABNORMAL LOW (ref 12.0–15.0)
Immature Granulocytes: 1 %
Lymphocytes Relative: 13 %
Lymphs Abs: 1.1 K/uL (ref 0.7–4.0)
MCH: 31.2 pg (ref 26.0–34.0)
MCHC: 33.4 g/dL (ref 30.0–36.0)
MCV: 93.4 fL (ref 80.0–100.0)
Monocytes Absolute: 0.8 K/uL (ref 0.1–1.0)
Monocytes Relative: 10 %
Neutro Abs: 5.6 K/uL (ref 1.7–7.7)
Neutrophils Relative %: 68 %
Platelets: 227 K/uL (ref 150–400)
RBC: 3.17 MIL/uL — ABNORMAL LOW (ref 3.87–5.11)
RDW: 12.5 % (ref 11.5–15.5)
WBC: 8.2 K/uL (ref 4.0–10.5)
nRBC: 0 % (ref 0.0–0.2)

## 2023-12-13 LAB — SURGICAL PATHOLOGY

## 2023-12-13 LAB — ZINC: Zinc: 15 ug/dL — ABNORMAL LOW (ref 44–115)

## 2023-12-13 MED ORDER — ZINC CHLORIDE 1 MG/ML IV SOLN
INTRAVENOUS | Status: AC
  Filled 2023-12-13: qty 480

## 2023-12-13 NOTE — Progress Notes (Signed)
 PROGRESS NOTE    Sandra Chung [facility]  FMW:[id number] DOB: 1960/11/09 DOA: 12/07/2023 PCP: Jyl Railing, MD  Chief Complaint  Patient presents with   Abdominal Pain    Hospital Course:  Sandra Chung 63 year old female with GERD, COPD, multiple prior abdominal surgeries including gastric bypass, hysterectomy, cholecystectomy, presented to the hospital with abdominal pain for 2 days with accompanying nausea and vomiting.  She was found to have an SBO and was admitted. She was initially managed conservatively with NG tube and was having bowel movements as well as passing a Gastrografin  challenge however KUB continue to reveal obstructive pattern and she had significant pain.  Repeat CT abdomen pelvis 9/16 revealed NG tube appeared to have perforated anterior wall of jejunum.  Patient went for urgent exploratory laparotomy on 9/16.  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  Subjective: Patient has been ambulating in the hallways.  Continues to endorse flatus and bowel movements.  She is anxious to begin food.  Objective: Vitals:   12/12/23 1939 12/13/23 0320 12/13/23 0500 12/13/23 0741  BP: (!) 141/80 (!) 146/74  135/75  Pulse: 86 78  79  Resp: 16 16  16   Temp: 98.6 F (37 C) 98.6 F (37 C)  97.8 F (36.6 C)  TempSrc:      SpO2: 97% 98%  97%  Weight:   82.3 kg   Height:        Intake/Output Summary (Last 24 hours) at 12/13/2023 1455 Last data filed at 12/13/2023 1100 Gross per 24 hour  Intake 1429.08 ml  Output 530 ml  Net 899.08 ml   Filed Weights   12/07/23 0721 12/07/23 1200 12/13/23 0500  Weight: 74.8 kg 74.8 kg 82.3 kg    Examination: General exam: Appears calm and comfortable, NAD  Respiratory system: No work of breathing, symmetric chest wall expansion Cardiovascular system: S1 & S2 heard, RRR.  Gastrointestinal system: Abdomen is distended,  abdominal binder in place, surgical site is well-healing, no oozing or bleeding at this time. NG tube remains in place.  Abdomen is appropriately tender to palpation.  NG canister with dark green liquid. Neuro: Alert and oriented. No focal neurological deficits. Extremities: Symmetric, expected ROM Skin: No rashes, lesions Psychiatry: Demonstrates appropriate judgement and insight. Mood & affect appropriate for situation.   Assessment & Plan:  Principal Problem:   Small bowel obstruction (HCC) Active Problems:   Perforated ulcer of intestine (HCC)   Small bowel obstruction with perforation - Ex lap 9/16: Laparotomy reduction of internal hernia, small bowel resection with primary stapled anastomosis, repair of jejunal ulceration just distal to gastrojejunostomy - NG tube in place, and placed intraoperatively reportedly past area of perforation - General Surgery recommending strict n.p.o. until Monday 9/22.  Will need upper GI at that time to reassess jejunal ulceration repair in setting of previous gastric bypass to ensure no leak - Remains high risk for ileus but no concerns at this time.  Endorses flatus and bowel movements - Continue with TPN - Continue with NG to low intermittent suction, advance diet as directed by general surgery - Continue with Zosyn  - OR cultures currently without growth - Continue high-dose PPI - Continue abdominal binder and honeycomb for patient comfort - Encouraged out of bed & mobilization as much as tolerated - PT/OT working with patient.  Home health needs to be considered but closer to discharge. - Continue to monitor surgical drain output.  COPD, not currently in exacerbation - Continue home dose bronchodilators - Inhalers as needed  BMI 31 Obesity class I - Outpatient follow up for lifestyle modification and risk factor management  DVT prophylaxis: Subcu heparin  for now.   Code Status: Full Code Disposition: Inpatient pending significant  clinical resolution.  Currently on TPN, and NGT  Consultants:  Treatment Team:  Consulting Physician: Marinda Jayson KIDD, MD  Procedures:   Ex lap 9/16  Antimicrobials:  Anti-infectives (From admission, onward)    Start     Dose/Rate Route Frequency Ordered Stop   12/09/23 2300  piperacillin -tazobactam (ZOSYN ) IVPB 3.375 g        3.375 g 12.5 mL/hr over 240 Minutes Intravenous Every 8 hours 12/09/23 1641     12/09/23 1445  piperacillin -tazobactam (ZOSYN ) IVPB 3.375 g        3.375 g 100 mL/hr over 30 Minutes Intravenous  Once 12/09/23 1440 12/09/23 1350   12/09/23 1330  cefoTEtan  (CEFOTAN ) 2 g in sodium chloride  0.9 % 100 mL IVPB        2 g 200 mL/hr over 30 Minutes Intravenous  Once 12/09/23 1316 12/09/23 1524       Data Reviewed: I have personally reviewed following labs and imaging studies CBC: Recent Labs  Lab 12/07/23 0722 12/08/23 0515 12/10/23 0505  WBC 11.1* 9.4 8.1  HGB 14.3 12.2 11.4*  HCT 42.9 37.6 33.8*  MCV 94.7 97.4 94.7  PLT 300 251 208   Basic Metabolic Panel: Recent Labs  Lab 12/09/23 0525 12/10/23 0505 12/11/23 0441 12/12/23 0559 12/13/23 1240  NA 142 140 139 140 141  K 4.2 3.8 3.2* 3.4* 4.4  CL 105 104 105 104 108  CO2 24 25 28 28 25   GLUCOSE 116* 110* 163* 110* 105*  BUN 18 16 18 20 18   CREATININE 0.90 1.20* 0.95 0.75 0.58  CALCIUM 9.1 8.7* 8.5* 8.5* 8.9  MG 1.7 1.6* 2.7* 2.2  --   PHOS 2.9 3.5 1.7* 2.0*  --    GFR: Estimated Creatinine Clearance: 69.1 mL/min (by C-G formula based on SCr of 0.58 mg/dL). Liver Function Tests: Recent Labs  Lab 12/07/23 0722 12/11/23 0441 12/13/23 1240  AST 23 22 43*  ALT 19 20 34  ALKPHOS 98 31* 49  BILITOT 0.9 0.6 0.8  PROT 6.8 5.6* 5.8*  ALBUMIN  4.0 3.1* 2.7*   CBG: Recent Labs  Lab 12/12/23 1959 12/13/23 0000 12/13/23 0321 12/13/23 0816 12/13/23 1207  GLUCAP 114* 128* 129* 127* 96    Recent Results (from the past 240 hours)  Resp panel by RT-PCR (RSV, Flu A&B, Covid) Anterior Nasal  Swab     Status: None   Collection Time: 12/07/23  9:05 AM   Specimen: Anterior Nasal Swab  Result Value Ref Range Status   SARS Coronavirus 2 by RT PCR NEGATIVE NEGATIVE Final    Comment: (NOTE) SARS-CoV-2 target nucleic acids are NOT DETECTED.  The SARS-CoV-2 RNA is generally detectable in upper respiratory specimens during the acute phase of infection. The lowest concentration of SARS-CoV-2 viral copies this assay can detect is 138 copies/mL. A negative result does not preclude SARS-Cov-2 infection and should not be used as the sole basis for treatment or other patient management decisions. A negative result may occur with  improper specimen collection/handling, submission of specimen other than nasopharyngeal swab, presence of viral mutation(s) within the areas targeted by this assay, and inadequate number of viral copies(<138 copies/mL). A negative result must be combined with clinical observations, patient history,  and epidemiological information. The expected result is Negative.  Fact Sheet for Patients:  BloggerCourse.com  Fact Sheet for Healthcare Providers:  SeriousBroker.it  This test is no t yet approved or cleared by the [place]  FDA and  has been authorized for detection and/or diagnosis of SARS-CoV-2 by FDA under an Emergency Use Authorization (EUA). This EUA will remain  in effect (meaning this test can be used) for the duration of the COVID-19 declaration under Section 564(b)(1) of the Act, 21 U.S.C.section 360bbb-3(b)(1), unless the authorization is terminated  or revoked sooner.       Influenza A by PCR NEGATIVE NEGATIVE Final   Influenza B by PCR NEGATIVE NEGATIVE Final    Comment: (NOTE) The Xpert Xpress SARS-CoV-2/FLU/RSV plus assay is intended as an aid in the diagnosis of influenza from Nasopharyngeal swab specimens and should not be used as a sole basis for treatment. Nasal washings and aspirates  are unacceptable for Xpert Xpress SARS-CoV-2/FLU/RSV testing.  Fact Sheet for Patients: BloggerCourse.com  Fact Sheet for Healthcare Providers: SeriousBroker.it  This test is not yet approved or cleared by the [place]  FDA and has been authorized for detection and/or diagnosis of SARS-CoV-2 by FDA under an Emergency Use Authorization (EUA). This EUA will remain in effect (meaning this test can be used) for the duration of the COVID-19 declaration under Section 564(b)(1) of the Act, 21 U.S.C. section 360bbb-3(b)(1), unless the authorization is terminated or revoked.     Resp Syncytial Virus by PCR NEGATIVE NEGATIVE Final    Comment: (NOTE) Fact Sheet for Patients: BloggerCourse.com  Fact Sheet for Healthcare Providers: SeriousBroker.it  This test is not yet approved or cleared by the [place]  FDA and has been authorized for detection and/or diagnosis of SARS-CoV-2 by FDA under an Emergency Use Authorization (EUA). This EUA will remain in effect (meaning this test can be used) for the duration of the COVID-19 declaration under Section 564(b)(1) of the Act, 21 U.S.C. section 360bbb-3(b)(1), unless the authorization is terminated or revoked.  Performed at [facility], [address] [place], [place] [address]   Aerobic/Anaerobic Culture w Gram Stain (surgical/deep wound)     Status: None (Preliminary result)   Collection Time: 12/09/23  2:28 PM   Specimen: Path fluid; Body Fluid  Result Value Ref Range Status   Specimen Description   Final    WOUND Performed at [facility], [address] [place], [place] [address]    Special Requests   Final    INTRA ABDOMINAL FLUID Performed at [facility], [address] [place], [place] [address]    Gram Stain   Final    FEW WBC PRESENT, PREDOMINANTLY PMN NO ORGANISMS SEEN    Culture   Final     NO GROWTH 4 DAYS NO ANAEROBES ISOLATED; CULTURE IN PROGRESS FOR 5 DAYS Performed at [facility], [address], [place], [place] [address]    Report Status PENDING  Incomplete     Radiology Studies: No results found.   Scheduled Meds:  budesonide -glycopyrrolate -formoterol   2 puff Inhalation BID   Chlorhexidine  Gluconate Cloth  6 each Topical Daily   cyanocobalamin   1,000 mcg Intramuscular Daily   heparin  injection (subcutaneous)  5,000 Units Subcutaneous Q8H   insulin  aspart  0-9 Units Subcutaneous Q4H   montelukast   10 mg Oral QHS   pantoprazole  (PROTONIX ) IV  80 mg Intravenous Q12H   scopolamine   1 patch Transdermal Q72H   sodium chloride  flush  10-40 mL Intracatheter  Q12H   thiamine  (VITAMIN B1) injection  100 mg Intravenous Q24H   Continuous Infusions:  acetaminophen  1,000 mg (12/13/23 1249)   copper  chloride 4 mg in dextrose  5 % 100 mL IVPB Stopped (12/12/23 1918)   piperacillin -tazobactam (ZOSYN )  IV 3.375 g (12/13/23 9376)   promethazine  (PHENERGAN ) injection (IM or IVPB) Stopped (12/09/23 1118)   TPN ADULT (ION) 50 mL/hr at 12/13/23 0546   TPN ADULT (ION)       LOS: 6 days  MDM: Patient is high risk for one or more organ failure.  They necessitate ongoing hospitalization for continued IV therapies and subsequent lab monitoring. Total time spent interpreting labs and vitals, reviewing the medical record, coordinating care amongst consultants and care team members, directly assessing and discussing care with the patient and/or family: 55 min Arlissa Monteverde, DO [facility]  To contact the attending physician between 7A-7P please use Epic Chat. To contact the covering physician during after hours 7P-7A, please review Amion.  12/13/2023, 2:55 PM   *This document has been created with the assistance of dictation software. Please excuse typographical errors. *

## 2023-12-13 NOTE — Consult Note (Signed)
 PHARMACY - TOTAL PARENTERAL NUTRITION CONSULT NOTE   Indication: Prolonged ileus  Patient Measurements: Height: 5' 1 (154.9 cm) Weight: 82.3 kg (181 lb 7 oz) IBW/kg (Calculated) : 47.8 TPN AdjBW (KG): 54.6 Body mass index is 34.28 kg/m. Usual Weight: 75.1 kg  Assessment:  63 yo F with PMH including GERD, extensive abdominal surgical history (including gastric bypass, hysterectomy, cholecystectomy) is presenting with SBO and perforation s/p resection and repair on 9/16.  Glucose / Insulin : no history of diabetes BG 127 1u insulin  in past 24 hours Electrolytes:  --Phos 2.0, Kphos 21 mmol IV x 1 ordered --K 3.4, Kcl 10 mEq IV x 2 + Kphos Renal: Scr 0.9>1.2>0.95 > 0.75 Hepatic: WNL Intake / Output; MIVF:   Intake/Output Summary (Last 24 hours) at 12/13/2023 0925 Last data filed at 12/13/2023 0546 Gross per 24 hour  Intake 2004.54 ml  Output 540 ml  Net 1464.54 ml    GI Imaging: 9/16 CTAP: Persistent high-grade small bowel obstruction, likely due to adhesions, but completely small-bowel obstruction is excluded. Significant dilation of the excluded stomach from enterogastric reflux.  GI Surgeries / Procedures:  9/16 OR: 1. Laparotomy reduction of internal hernia 2. Small bowel resection with primary stapled anastomosis 3. Repair of jejunal ulceration just distal to gatrojejunostomy  Central access: PICC to be placed 9/17 TPN start date: 9/17  Nutritional Goals: Goal TPN rate is 70 mL/hr (provides 100 g of protein and 1905 kcals per day)  RD Assessment: RD consulted, communicated: 1800-2100kcal/day, 90-105g/day protein and 1.5-1.7L/day fluid, she will need thiamine  for 7 days due to history of gastric bypass  Current Nutrition:  NPO  Plan:  Continue TPN to 50 mL/hr at 1800 Electrolytes in TPN: Na 50mEq/L, K 50mEq/L, Ca 31mEq/L, Mg 59mEq/L, and Phos 15mmol/L. Cl:Ac 1:1 Add standard MVI and trace elements to TPN Initiate Sensitive q4h SSI and adjust as needed Initiate  thiamine  100mg  IV daily x 7 days (administering outside TPN) Add zinc  per RD.  Monitor electrolytes the next 3 days due to refeeding risk Monitor TPN labs on Mon/Thurs  Cathaleen Blanch, PharmD, BCPS Clinical Pharmacist 12/13/2023 9:25 AM

## 2023-12-13 NOTE — Progress Notes (Signed)
 Patient ID: Sandra Chung, female   DOB: 1961/03/02, 63 y.o.   MRN: [id number]     SURGICAL PROGRESS NOTE   Hospital Day(s): 6.   Interval History: Patient seen and examined, no acute events or new complaints overnight. Patient reports feeling better this morning.  She endorses that the abdominal pain is slowly improving.  Pain is mainly from the drain exit.  She denies passing rectal gas.  She endorses burping a lot.  Her drain output was 510.  Drain output is serous.  NGT output was 30 cc in the 74 hours.  Vital signs in last 24 hours: [min-max] current  Temp:  [97.8 F (36.6 C)-98.6 F (37 C)] 97.8 F (36.6 C) (09/20 0741) Pulse Rate:  [78-88] 79 (09/20 0741) Resp:  [16] 16 (09/20 0741) BP: (129-146)/(74-82) 135/75 (09/20 0741) SpO2:  [97 %-98 %] 97 % (09/20 0741) Weight:  [82.3 kg] 82.3 kg (09/20 0500)     Height: 5' 1 (154.9 cm) Weight: 82.3 kg BMI (Calculated): 34.3   Physical Exam:  Constitutional: alert, cooperative and no distress  Respiratory: breathing non-labored at rest  Cardiovascular: regular rate and sinus rhythm  Gastrointestinal: soft, non-tender, and non-distended.  Wound is dry and clean.  Labs:     Latest Ref Rng & Units 12/10/2023    5:05 AM 12/08/2023    5:15 AM 12/07/2023    7:22 AM  CBC  WBC 4.0 - 10.5 K/uL 8.1  9.4  11.1   Hemoglobin 12.0 - 15.0 g/dL 88.5  87.7  85.6   Hematocrit 36.0 - 46.0 % 33.8  37.6  42.9   Platelets 150 - 400 K/uL 208  251  300       Latest Ref Rng & Units 12/12/2023    5:59 AM 12/11/2023    4:41 AM 12/10/2023    5:05 AM  CMP  Glucose 70 - 99 mg/dL 889  836  889   BUN 8 - 23 mg/dL 20  18  16    Creatinine 0.44 - 1.00 mg/dL 9.24  9.04  8.79   Sodium 135 - 145 mmol/L 140  139  140   Potassium 3.5 - 5.1 mmol/L 3.4  3.2  3.8   Chloride 98 - 111 mmol/L 104  105  104   CO2 22 - 32 mmol/L 28  28  25    Calcium 8.9 - 10.3 mg/dL 8.5  8.5  8.7   Total Protein 6.5 - 8.1 g/dL  5.6    Total Bilirubin 0.0 - 1.2 mg/dL  0.6    Alkaline  Phos 38 - 126 U/L  31    AST 15 - 41 U/L  22    ALT 0 - 44 U/L  20      Imaging studies: No new pertinent imaging studies   Assessment/Plan:  63 y.o. female with small bowel obstruction and marginal ulcer perforation 4 Days Post-Op s/p repair of marginal ulcer and small bowel resection, complicated by pertinent comorbidities including COPD.  -Patient stable, no fever.  Adequate blood pressure and heart rate. - Pain slowly getting under control.  Main pain is due to drain. - There is adequate NGT output.  There is high output through the drain but is serous, no sign of leak from perforation repair or from the intestinal anastomosis - White blood cell count within normal limits.  No fever - Currently will continue with plan to continue NGT to low intermittent suction, drain in place, TPN and to proceed with upper  GI series for evaluation of the repair before removing the drain or the NGT. - Continue pain management - Encouraged the patient to ambulate - Continue DVT prophylaxis  Lucas Petrin, MD

## 2023-12-13 NOTE — Progress Notes (Signed)
 Physical Therapy Treatment Patient Details Name: Sandra Chung MRN: [id number] DOB: [birth year] Today's Date: 12/13/2023   History of Present Illness Sandra Chung is a 63 y.o. 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 Presents with small bowel obstruction.  9/16 revealed NG tube appeared to have perforated anterior wall of jejunum. S/p laparotomy, small bowel resection, and repair of jejunal ulceration just distal to gatrojejunostomy on 12/09/23    PT Comments  Pt performs functional mobility at a supervision to intermittently Mod I level.  Pt requires supervision with dynamic standing balance without UE support within BOS and demonstrates decreased balance reactions without UE support.  Continued PT will assist pt towards greater dynamic standing balance, LE strengthening, and activity tolerance to increase safety and independence and decrease burden of care with functional mobility.      If plan is discharge home, recommend the following: A little help with walking and/or transfers;A little help with bathing/dressing/bathroom;Assistance with cooking/housework;Assist for transportation;Help with stairs or ramp for entrance   Can travel by private vehicle        Equipment Recommendations  Rolling walker (2 wheels)    Recommendations for Other Services       Precautions / Restrictions Precautions Precautions: None Recall of Precautions/Restrictions: Intact Restrictions Weight Bearing Restrictions Per Provider Order: No     Mobility  Bed Mobility Overal bed mobility: Modified Independent Bed Mobility: Supine to Sit, Sit to Supine     Supine to sit: HOB elevated, Used rails, Modified independent (Device/Increase time) Sit to supine: HOB elevated, Used rails, Modified independent (Device/Increase time)   General bed mobility  comments: No cues required to get in/out of bed.    Transfers Overall transfer level: Needs assistance Equipment used: Rolling walker (2 wheels), None Transfers: Sit to/from Stand, Bed to chair/wheelchair/BSC Sit to Stand: Supervision   Step pivot transfers: Supervision       General transfer comment: min cues for hand placement with walker in front.  Pt able to perform sit<>stands without AD at supervision level.    Ambulation/Gait Ambulation/Gait assistance: Supervision Gait Distance (Feet): 400 Feet Assistive device: Rolling walker (2 wheels) Gait Pattern/deviations: Step-through pattern, Narrow base of support, WFL(Within Functional Limits)       General Gait Details: No LOB, steady   Stairs             Wheelchair Mobility     Tilt Bed    Modified Rankin (Stroke Patients Only)       Balance Overall balance assessment: Needs assistance Sitting-balance support: Feet supported Sitting balance-Leahy Scale: Good     Standing balance support: Single extremity supported, No upper extremity supported, During functional activity Standing balance-Leahy Scale: Good Standing balance comment: supervision for dynamic standing balance within BOS.                            Communication Communication Communication: No apparent difficulties  Cognition Arousal: Alert Behavior During Therapy: WFL for tasks assessed/performed   PT - Cognitive impairments: No apparent impairments                         Following commands: Intact      Cueing Cueing Techniques: Verbal cues, Visual cues  Exercises      General Comments        Pertinent  Vitals/Pain Pain Assessment Pain Assessment: Faces Pain Score: 4  Pain Location: abdomen Pain Descriptors / Indicators: Discomfort Pain Intervention(s): Monitored during session    Home Living                          Prior Function            PT Goals (current goals can now be  found in the care plan section) Acute Rehab PT Goals Patient Stated Goal: to go home PT Goal Formulation: With patient Time For Goal Achievement: 12/25/23 Potential to Achieve Goals: Good Progress towards PT goals: Progressing toward goals    Frequency    Min 2X/week      PT Plan      Co-evaluation              AM-PAC PT 6 Clicks Mobility   Outcome Measure  Help needed turning from your back to your side while in a flat bed without using bedrails?: A Little Help needed moving from lying on your back to sitting on the side of a flat bed without using bedrails?: A Little Help needed moving to and from a bed to a chair (including a wheelchair)?: None Help needed standing up from a chair using your arms (e.g., wheelchair or bedside chair)?: None Help needed to walk in hospital room?: A Little Help needed climbing 3-5 steps with a railing? : A Little 6 Click Score: 20    End of Session Equipment Utilized During Treatment: Gait belt Activity Tolerance: Patient tolerated treatment well Patient left: in bed;with call bell/phone within reach Nurse Communication: Mobility status PT Visit Diagnosis: Unsteadiness on feet (R26.81);Other abnormalities of gait and mobility (R26.89);Muscle weakness (generalized) (M62.81);Pain     Time: 1130-1155 PT Time Calculation (min) (ACUTE ONLY): 25 min  Charges:    $Gait Training: 8-22 mins $Therapeutic Activity: 8-22 mins PT General Charges $$ ACUTE PT VISIT: 1 Visit                     Sandra Chung, PTA  12/13/23, 12:10 PM

## 2023-12-14 DIAGNOSIS — K56609 Unspecified intestinal obstruction, unspecified as to partial versus complete obstruction: Secondary | ICD-10-CM | POA: Diagnosis not present

## 2023-12-14 LAB — GLUCOSE, CAPILLARY
Glucose-Capillary: 103 mg/dL — ABNORMAL HIGH (ref 70–99)
Glucose-Capillary: 121 mg/dL — ABNORMAL HIGH (ref 70–99)
Glucose-Capillary: 131 mg/dL — ABNORMAL HIGH (ref 70–99)
Glucose-Capillary: 135 mg/dL — ABNORMAL HIGH (ref 70–99)
Glucose-Capillary: 143 mg/dL — ABNORMAL HIGH (ref 70–99)
Glucose-Capillary: 146 mg/dL — ABNORMAL HIGH (ref 70–99)
Glucose-Capillary: 146 mg/dL — ABNORMAL HIGH (ref 70–99)

## 2023-12-14 LAB — COMPREHENSIVE METABOLIC PANEL WITH GFR
ALT: 29 U/L (ref 0–44)
AST: 24 U/L (ref 15–41)
Albumin: 2.6 g/dL — ABNORMAL LOW (ref 3.5–5.0)
Alkaline Phosphatase: 47 U/L (ref 38–126)
Anion gap: 7 (ref 5–15)
BUN: 16 mg/dL (ref 8–23)
CO2: 27 mmol/L (ref 22–32)
Calcium: 8.7 mg/dL — ABNORMAL LOW (ref 8.9–10.3)
Chloride: 104 mmol/L (ref 98–111)
Creatinine, Ser: 0.63 mg/dL (ref 0.44–1.00)
GFR, Estimated: >60 mL/min (ref >60)
Glucose, Bld: 129 mg/dL — ABNORMAL HIGH (ref 70–99)
Potassium: 3.9 mmol/L (ref 3.5–5.1)
Sodium: 138 mmol/L (ref 135–145)
Total Bilirubin: 0.4 mg/dL (ref 0.0–1.2)
Total Protein: 5.7 g/dL — ABNORMAL LOW (ref 6.5–8.1)

## 2023-12-14 LAB — CBC WITH DIFFERENTIAL/PLATELET
Abs Immature Granulocytes: 0.24 K/uL — ABNORMAL HIGH (ref 0.00–0.07)
Basophils Absolute: 0 K/uL (ref 0.0–0.1)
Basophils Relative: 0 %
Eosinophils Absolute: 0.8 K/uL — ABNORMAL HIGH (ref 0.0–0.5)
Eosinophils Relative: 7 %
HCT: 30.6 % — ABNORMAL LOW (ref 36.0–46.0)
Hemoglobin: 9.9 g/dL — ABNORMAL LOW (ref 12.0–15.0)
Immature Granulocytes: 2 %
Lymphocytes Relative: 10 %
Lymphs Abs: 1.2 K/uL (ref 0.7–4.0)
MCH: 30.7 pg (ref 26.0–34.0)
MCHC: 32.4 g/dL (ref 30.0–36.0)
MCV: 95 fL (ref 80.0–100.0)
Monocytes Absolute: 1.3 K/uL — ABNORMAL HIGH (ref 0.1–1.0)
Monocytes Relative: 11 %
Neutro Abs: 8 K/uL — ABNORMAL HIGH (ref 1.7–7.7)
Neutrophils Relative %: 70 %
Platelets: 251 K/uL (ref 150–400)
RBC: 3.22 MIL/uL — ABNORMAL LOW (ref 3.87–5.11)
RDW: 12.3 % (ref 11.5–15.5)
WBC: 11.6 K/uL — ABNORMAL HIGH (ref 4.0–10.5)
nRBC: 0 % (ref 0.0–0.2)

## 2023-12-14 LAB — AEROBIC/ANAEROBIC CULTURE W GRAM STAIN (SURGICAL/DEEP WOUND): Culture: NO GROWTH

## 2023-12-14 LAB — MAGNESIUM: Magnesium: 2 mg/dL (ref 1.7–2.4)

## 2023-12-14 LAB — PHOSPHORUS: Phosphorus: 2.8 mg/dL (ref 2.5–4.6)

## 2023-12-14 MED ORDER — ZINC CHLORIDE 1 MG/ML IV SOLN
INTRAVENOUS | Status: AC
  Filled 2023-12-14: qty 672

## 2023-12-14 NOTE — Progress Notes (Signed)
 Patient ID: Sandra Chung, female   DOB: 1961-03-03, 63 y.o.   MRN: [id number]     SURGICAL PROGRESS NOTE   Hospital Day(s): 7.   Interval History: Patient seen and examined, no acute events or new complaints overnight. Patient reports feeling okay.  She denies any worsening abdominal pain.  She continued having soreness on the right side abdomen from the drain site.  Denies any nausea or vomiting.  She endorses passing gas and had a small bowel movement.  She denies any fever.  The drain output is serous but not charted.  NGT output 500 cc in last 24 hours which was increased compared to 30 cc the day before.  Vital signs in last 24 hours: [min-max] current  Temp:  [98 F (36.7 C)-99 F (37.2 C)] 98.4 F (36.9 C) (09/21 0457) Pulse Rate:  [79-81] 81 (09/21 0457) Resp:  [16] 16 (09/21 0457) BP: (132-168)/(74-87) 140/80 (09/21 0457) SpO2:  [96 %-99 %] 96 % (09/21 0457) Weight:  [82.3 kg] 82.3 kg (09/21 0500)     Height: 5' 1 (154.9 cm) Weight: 82.3 kg BMI (Calculated): 34.3   Physical Exam:  Constitutional: alert, cooperative and no distress  Respiratory: breathing non-labored at rest  Cardiovascular: regular rate and sinus rhythm  Gastrointestinal: soft, non-tender, and non-distended.  The wounds are clean.  Drain is serous.  Labs:     Latest Ref Rng & Units 12/14/2023    3:53 AM 12/13/2023    4:11 PM 12/10/2023    5:05 AM  CBC  WBC 4.0 - 10.5 K/uL 11.6  8.2  8.1   Hemoglobin 12.0 - 15.0 g/dL 9.9  9.9  88.5   Hematocrit 36.0 - 46.0 % 30.6  29.6  33.8   Platelets 150 - 400 K/uL 251  227  208       Latest Ref Rng & Units 12/14/2023    3:53 AM 12/13/2023   12:40 PM 12/12/2023    5:59 AM  CMP  Glucose 70 - 99 mg/dL 870  894  889   BUN 8 - 23 mg/dL 16  18  20    Creatinine 0.44 - 1.00 mg/dL 9.36  9.41  9.24   Sodium 135 - 145 mmol/L 138  141  140   Potassium 3.5 - 5.1 mmol/L 3.9  4.4  3.4   Chloride 98 - 111 mmol/L 104  108  104   CO2 22 - 32 mmol/L 27  25  28    Calcium 8.9  - 10.3 mg/dL 8.7  8.9  8.5   Total Protein 6.5 - 8.1 g/dL 5.7  5.8    Total Bilirubin 0.0 - 1.2 mg/dL 0.4  0.8    Alkaline Phos 38 - 126 U/L 47  49    AST 15 - 41 U/L 24  43    ALT 0 - 44 U/L 29  34      Imaging studies: No new pertinent imaging studies   Assessment/Plan:  63 y.o. female with small bowel obstruction and marginal ulcer perforation 5 Days Post-Op s/p repair of marginal ulcer and small bowel resection, complicated by pertinent comorbidities including COPD.   -Patient stable, no fever.  Adequate blood pressure and heart rate. - Continue with adequate pain control.  Some soreness on the drain site. -Increase NGT output.  Patient had a bowel movement.  Will continue with NGT in place - Small bump in the white blood cell count to 11.6.  No fever.  No worsening abdominal pain -  Currently will continue with plan to continue NGT to low intermittent suction, drain in place, TPN and to proceed with upper GI series on Monday for evaluation of the repair before removing the drain or the NGT. - Continue pain management - Encouraged the patient to ambulate - Continue DVT prophylaxis  Lucas Petrin, MD

## 2023-12-14 NOTE — Progress Notes (Signed)
 PROGRESS NOTE    Sandra Chung [facility]  FMW:[id number] DOB: 11-10-1960 DOA: 12/07/2023 PCP: Jyl Railing, MD  Chief Complaint  Patient presents with   Abdominal Pain    Hospital Course:  Sandra Chung 63 year old female with GERD, COPD, multiple prior abdominal surgeries including gastric bypass, hysterectomy, cholecystectomy, presented to the hospital with abdominal pain for 2 days with accompanying nausea and vomiting.  She was found to have an SBO and was admitted. She was initially managed conservatively with NG tube and was having bowel movements as well as passing a Gastrografin  challenge however KUB continue to reveal obstructive pattern and she had significant pain.  Repeat CT abdomen pelvis 9/16 revealed NG tube appeared to have perforated anterior wall of jejunum.  Patient went for urgent exploratory laparotomy on 9/16.  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  Subjective: No acute complaints today.  She continues to have flatus and liquid bowel movements. She reports her pain is currently well-controlled.  Is having occasional nausea.  Objective: Vitals:   12/13/23 1936 12/14/23 0457 12/14/23 0500 12/14/23 1616  BP: (!) 168/87 (!) 140/80  136/79  Pulse: 80 81  85  Resp: 16 16  18   Temp: 99 F (37.2 C) 98.4 F (36.9 C)  98.5 F (36.9 C)  TempSrc:      SpO2: 99% 96%  97%  Weight:   82.3 kg   Height:        Intake/Output Summary (Last 24 hours) at 12/14/2023 1635 Last data filed at 12/14/2023 0800 Gross per 24 hour  Intake 0 ml  Output 520 ml  Net -520 ml   Filed Weights   12/07/23 1200 12/13/23 0500 12/14/23 0500  Weight: 74.8 kg 82.3 kg 82.3 kg    Examination: General exam: Appears calm and comfortable, NAD  Respiratory system: No work of breathing, symmetric chest wall expansion Cardiovascular system: S1 & S2 heard, RRR.   Gastrointestinal system: Abdomen is distended, abdominal binder in place, surgical site is well-healing, no oozing or bleeding at this time. NG tube remains in place.  Abdomen is appropriately tender to palpation.  NG canister with dark green liquid. Neuro: Alert and oriented. No focal neurological deficits. Extremities: Symmetric, expected ROM Skin: No rashes, lesions Psychiatry: Demonstrates appropriate judgement and insight. Mood & affect appropriate for situation.   Assessment & Plan:  Principal Problem:   Small bowel obstruction (HCC) Active Problems:   Perforated ulcer of intestine (HCC)   Small bowel obstruction with perforation - Ex lap 9/16: Laparotomy reduction of internal hernia, small bowel resection with primary stapled anastomosis, repair of jejunal ulceration just distal to gastrojejunostomy - NG tube in place, and placed intraoperatively reportedly past area of perforation - General Surgery recommending strict n.p.o. until Monday 9/22.  Will need upper GI at that time to reassess jejunal ulceration repair in setting of previous gastric bypass to ensure no leak - Remains high risk for ileus but no concerns at this time.  Continues to have flatus and bowel movements - NG tube management per general surgery.  Currently on LIS - Continue with TPN, electrolyte repletion per pharmacy - Continue with as needed analgesics and antiemetics - Continue high-dose PPI - Or cultures currently without growth.  Continue with Zosyn  for now. - Mild bump in WBC, but afebrile with no significant drain change.  Monitor CBC closely. - Continue abdominal binder and honeycomb for patient comfort - Encouraged out  of bed & mobilization as much as tolerated - PT/OT working with patient.  Home health needs to be considered but closer to discharge. - Continue to monitor surgical drain output.  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   Code Status: Full Code Disposition: Inpatient pending significant clinical resolution.  Currently on TPN, and NGT  Consultants:  Treatment Team:  Consulting Physician: Marinda Jayson KIDD, MD  Procedures:   Ex lap 9/16  Antimicrobials:  Anti-infectives (From admission, onward)    Start     Dose/Rate Route Frequency Ordered Stop   12/09/23 2300  piperacillin -tazobactam (ZOSYN ) IVPB 3.375 g        3.375 g 12.5 mL/hr over 240 Minutes Intravenous Every 8 hours 12/09/23 1641     12/09/23 1445  piperacillin -tazobactam (ZOSYN ) IVPB 3.375 g        3.375 g 100 mL/hr over 30 Minutes Intravenous  Once 12/09/23 1440 12/09/23 1350   12/09/23 1330  cefoTEtan  (CEFOTAN ) 2 g in sodium chloride  0.9 % 100 mL IVPB        2 g 200 mL/hr over 30 Minutes Intravenous  Once 12/09/23 1316 12/09/23 1524       Data Reviewed: I have personally reviewed following labs and imaging studies CBC: Recent Labs  Lab 12/08/23 0515 12/10/23 0505 12/13/23 1611 12/14/23 0353  WBC 9.4 8.1 8.2 11.6*  NEUTROABS  --   --  5.6 8.0*  HGB 12.2 11.4* 9.9* 9.9*  HCT 37.6 33.8* 29.6* 30.6*  MCV 97.4 94.7 93.4 95.0  PLT 251 208 227 251   Basic Metabolic Panel: Recent Labs  Lab 12/09/23 0525 12/10/23 0505 12/11/23 0441 12/12/23 0559 12/13/23 1240 12/14/23 0353  NA 142 140 139 140 141 138  K 4.2 3.8 3.2* 3.4* 4.4 3.9  CL 105 104 105 104 108 104  CO2 24 25 28 28 25 27   GLUCOSE 116* 110* 163* 110* 105* 129*  BUN 18 16 18 20 18 16   CREATININE 0.90 1.20* 0.95 0.75 0.58 0.63  CALCIUM 9.1 8.7* 8.5* 8.5* 8.9 8.7*  MG 1.7 1.6* 2.7* 2.2  --  2.0  PHOS 2.9 3.5 1.7* 2.0*  --  2.8   GFR: Estimated Creatinine Clearance: 69.1 mL/min (by C-G formula based on SCr of 0.63 mg/dL). Liver Function Tests: Recent Labs  Lab 12/11/23 0441 12/13/23 1240 12/14/23 0353  AST 22 43* 24  ALT  20 34 29  ALKPHOS 31* 49 47  BILITOT 0.6 0.8 0.4  PROT 5.6* 5.8* 5.7*  ALBUMIN  3.1* 2.7* 2.6*   CBG: Recent Labs  Lab 12/14/23 0029 12/14/23 0455 12/14/23 0747 12/14/23 1207 12/14/23 1613  GLUCAP 143* 146* 131* 103* 121*    Recent Results (from the past 240 hours)  Resp panel by RT-PCR (RSV, Flu A&B, Covid) Anterior Nasal Swab     Status: None   Collection Time: 12/07/23  9:05 AM   Specimen: Anterior Nasal Swab  Result Value Ref Range Status   SARS Coronavirus 2 by RT PCR NEGATIVE NEGATIVE Final    Comment: (NOTE) SARS-CoV-2 target nucleic acids are NOT DETECTED.  The SARS-CoV-2 RNA is generally detectable in upper respiratory specimens during the acute phase of infection. The lowest concentration of SARS-CoV-2 viral copies this assay can detect is 138 copies/mL. A negative result does not preclude SARS-Cov-2 infection and  should not be used as the sole basis for treatment or other patient management decisions. A negative result may occur with  improper specimen collection/handling, submission of specimen other than nasopharyngeal swab, presence of viral mutation(s) within the areas targeted by this assay, and inadequate number of viral copies(<138 copies/mL). A negative result must be combined with clinical observations, patient history, and epidemiological information. The expected result is Negative.  Fact Sheet for Patients:  BloggerCourse.com  Fact Sheet for Healthcare Providers:  SeriousBroker.it  This test is no t yet approved or cleared by the [place]  FDA and  has been authorized for detection and/or diagnosis of SARS-CoV-2 by FDA under an Emergency Use Authorization (EUA). This EUA will remain  in effect (meaning this test can be used) for the duration of the COVID-19 declaration under Section 564(b)(1) of the Act, 21 U.S.C.section 360bbb-3(b)(1), unless the authorization is terminated  or revoked  sooner.       Influenza A by PCR NEGATIVE NEGATIVE Final   Influenza B by PCR NEGATIVE NEGATIVE Final    Comment: (NOTE) The Xpert Xpress SARS-CoV-2/FLU/RSV plus assay is intended as an aid in the diagnosis of influenza from Nasopharyngeal swab specimens and should not be used as a sole basis for treatment. Nasal washings and aspirates are unacceptable for Xpert Xpress SARS-CoV-2/FLU/RSV testing.  Fact Sheet for Patients: BloggerCourse.com  Fact Sheet for Healthcare Providers: SeriousBroker.it  This test is not yet approved or cleared by the [place]  FDA and has been authorized for detection and/or diagnosis of SARS-CoV-2 by FDA under an Emergency Use Authorization (EUA). This EUA will remain in effect (meaning this test can be used) for the duration of the COVID-19 declaration under Section 564(b)(1) of the Act, 21 U.S.C. section 360bbb-3(b)(1), unless the authorization is terminated or revoked.     Resp Syncytial Virus by PCR NEGATIVE NEGATIVE Final    Comment: (NOTE) Fact Sheet for Patients: BloggerCourse.com  Fact Sheet for Healthcare Providers: SeriousBroker.it  This test is not yet approved or cleared by the [place]  FDA and has been authorized for detection and/or diagnosis of SARS-CoV-2 by FDA under an Emergency Use Authorization (EUA). This EUA will remain in effect (meaning this test can be used) for the duration of the COVID-19 declaration under Section 564(b)(1) of the Act, 21 U.S.C. section 360bbb-3(b)(1), unless the authorization is terminated or revoked.  Performed at [facility], [address] [place], [place] [address]   Aerobic/Anaerobic Culture w Gram Stain (surgical/deep wound)     Status: None (Preliminary result)   Collection Time: 12/09/23  2:28 PM   Specimen: Path fluid; Body Fluid  Result Value Ref Range Status   Specimen  Description   Final    WOUND Performed at [facility], [address], [place], [place] [address]    Special Requests   Final    INTRA ABDOMINAL FLUID Performed at [facility], [address] [place], [place] [address]    Gram Stain   Final    FEW WBC PRESENT, PREDOMINANTLY PMN NO ORGANISMS SEEN    Culture   Final    NO GROWTH 5 DAYS NO ANAEROBES ISOLATED; CULTURE IN PROGRESS FOR 5 DAYS Performed at [facility], [address] [place], [place] [address]    Report Status PENDING  Incomplete     Radiology Studies: No results found.   Scheduled Meds:  budesonide -glycopyrrolate -formoterol   2 puff Inhalation BID   Chlorhexidine  Gluconate Cloth  6 each Topical Daily  cyanocobalamin   1,000 mcg Intramuscular Daily   heparin  injection (subcutaneous)  5,000 Units Subcutaneous Q8H   insulin  aspart  0-9 Units Subcutaneous Q4H   montelukast   10 mg Oral QHS   pantoprazole  (PROTONIX ) IV  80 mg Intravenous Q12H   scopolamine   1 patch Transdermal Q72H   sodium chloride  flush  10-40 mL Intracatheter Q12H   thiamine  (VITAMIN B1) injection  100 mg Intravenous Q24H   Continuous Infusions:  copper  chloride 4 mg in dextrose  5 % 100 mL IVPB 4 mg (12/13/23 1716)   piperacillin -tazobactam (ZOSYN )  IV 3.375 g (12/14/23 1500)   promethazine  (PHENERGAN ) injection (IM or IVPB) Stopped (12/09/23 1118)   TPN ADULT (ION) 50 mL/hr at 12/13/23 1806   TPN ADULT (ION)       LOS: 7 days  MDM: Patient is high risk for one or more organ failure.  They necessitate ongoing hospitalization for continued IV therapies and subsequent lab monitoring. Total time spent interpreting labs and vitals, reviewing the medical record, coordinating care amongst consultants and care team members, directly assessing and discussing care with the patient and/or family: 55 min Jyll Tomaro, DO [facility]  To contact the attending physician between 7A-7P please use Epic Chat. To contact the  covering physician during after hours 7P-7A, please review Amion.  12/14/2023, 4:35 PM   *This document has been created with the assistance of dictation software. Please excuse typographical errors. *

## 2023-12-14 NOTE — Consult Note (Signed)
 PHARMACY - TOTAL PARENTERAL NUTRITION CONSULT NOTE   Indication: Prolonged ileus  Patient Measurements: Height: 5' 1 (154.9 cm) Weight: 82.3 kg (181 lb 7 oz) IBW/kg (Calculated) : 47.8 TPN AdjBW (KG): 54.6 Body mass index is 34.28 kg/m. Usual Weight: 75.1 kg  Assessment:  63 yo F with PMH including GERD, extensive abdominal surgical history (including gastric bypass, hysterectomy, cholecystectomy) is presenting with SBO and perforation s/p resection and repair on 9/16.  Glucose / Insulin : no history of diabetes BG 131 4u insulin  in past 24 hours Electrolytes:  --Phos 2.8, --K 3.9 Renal: Scr 0.9>1.2>0.95 > 0.75 > 0.63 Hepatic: WNL Intake / Output; MIVF:   Intake/Output Summary (Last 24 hours) at 12/14/2023 0853 Last data filed at 12/13/2023 2213 Gross per 24 hour  Intake 751.63 ml  Output 500 ml  Net 251.63 ml    GI Imaging: 9/16 CTAP: Persistent high-grade small bowel obstruction, likely due to adhesions, but completely small-bowel obstruction is excluded. Significant dilation of the excluded stomach from enterogastric reflux.  GI Surgeries / Procedures:  9/16 OR: 1. Laparotomy reduction of internal hernia 2. Small bowel resection with primary stapled anastomosis 3. Repair of jejunal ulceration just distal to gatrojejunostomy  Central access: PICC to be placed 9/17 TPN start date: 9/17  Nutritional Goals: Goal TPN rate is 70 mL/hr (provides 100 g of protein and 1905 kcals per day)  RD Assessment: RD consulted, communicated: 1800-2100kcal/day, 90-105g/day protein and 1.5-1.7L/day fluid, she will need thiamine  for 7 days due to history of gastric bypass  Current Nutrition:  NPO  Plan:  Increase TPN to 70 mL/hr at 1800 Electrolytes in TPN: Na 61mEq/L, K 38mEq/L, Ca 46mEq/L, Mg 69mEq/L, and Phos 15mmol/L. Cl:Ac 1:1 Add standard MVI and trace elements to TPN Initiate Sensitive q4h SSI and adjust as needed Initiate thiamine  100mg  IV daily x 7 days (administering  outside TPN) Add zinc  per RD.  Monitor electrolytes the next 3 days due to refeeding risk Monitor TPN labs on Mon/Thurs  Cathaleen Blanch, PharmD, BCPS Clinical Pharmacist 12/14/2023 8:53 AM

## 2023-12-15 ENCOUNTER — Inpatient Hospital Stay

## 2023-12-15 DIAGNOSIS — K56609 Unspecified intestinal obstruction, unspecified as to partial versus complete obstruction: Secondary | ICD-10-CM | POA: Diagnosis not present

## 2023-12-15 LAB — CBC WITH DIFFERENTIAL/PLATELET
Abs Immature Granulocytes: 0.77 K/uL — ABNORMAL HIGH (ref 0.00–0.07)
Basophils Absolute: 0.1 K/uL (ref 0.0–0.1)
Basophils Relative: 0 %
Eosinophils Absolute: 1 K/uL — ABNORMAL HIGH (ref 0.0–0.5)
Eosinophils Relative: 6 %
HCT: 30.9 % — ABNORMAL LOW (ref 36.0–46.0)
Hemoglobin: 10.2 g/dL — ABNORMAL LOW (ref 12.0–15.0)
Immature Granulocytes: 5 %
Lymphocytes Relative: 9 %
Lymphs Abs: 1.4 K/uL (ref 0.7–4.0)
MCH: 31.3 pg (ref 26.0–34.0)
MCHC: 33 g/dL (ref 30.0–36.0)
MCV: 94.8 fL (ref 80.0–100.0)
Monocytes Absolute: 2.1 K/uL — ABNORMAL HIGH (ref 0.1–1.0)
Monocytes Relative: 14 %
Neutro Abs: 10.3 K/uL — ABNORMAL HIGH (ref 1.7–7.7)
Neutrophils Relative %: 66 %
Platelets: 290 K/uL (ref 150–400)
RBC: 3.26 MIL/uL — ABNORMAL LOW (ref 3.87–5.11)
RDW: 12.6 % (ref 11.5–15.5)
WBC: 15.6 K/uL — ABNORMAL HIGH (ref 4.0–10.5)
nRBC: 0 % (ref 0.0–0.2)

## 2023-12-15 LAB — COMPREHENSIVE METABOLIC PANEL WITH GFR
ALT: 53 U/L — ABNORMAL HIGH (ref 0–44)
AST: 61 U/L — ABNORMAL HIGH (ref 15–41)
Albumin: 2.5 g/dL — ABNORMAL LOW (ref 3.5–5.0)
Alkaline Phosphatase: 52 U/L (ref 38–126)
Anion gap: 7 (ref 5–15)
BUN: 18 mg/dL (ref 8–23)
CO2: 26 mmol/L (ref 22–32)
Calcium: 8.4 mg/dL — ABNORMAL LOW (ref 8.9–10.3)
Chloride: 101 mmol/L (ref 98–111)
Creatinine, Ser: 0.74 mg/dL (ref 0.44–1.00)
GFR, Estimated: >60 mL/min (ref >60)
Glucose, Bld: 126 mg/dL — ABNORMAL HIGH (ref 70–99)
Potassium: 4 mmol/L (ref 3.5–5.1)
Sodium: 134 mmol/L — ABNORMAL LOW (ref 135–145)
Total Bilirubin: 0.5 mg/dL (ref 0.0–1.2)
Total Protein: 5.7 g/dL — ABNORMAL LOW (ref 6.5–8.1)

## 2023-12-15 LAB — MAGNESIUM: Magnesium: 2 mg/dL (ref 1.7–2.4)

## 2023-12-15 LAB — RESP PANEL BY RT-PCR (RSV, FLU A&B, COVID)  RVPGX2
Influenza A by PCR: NEGATIVE
Influenza B by PCR: NEGATIVE
Resp Syncytial Virus by PCR: NEGATIVE
SARS Coronavirus 2 by RT PCR: NEGATIVE

## 2023-12-15 LAB — GLUCOSE, CAPILLARY
Glucose-Capillary: 111 mg/dL — ABNORMAL HIGH (ref 70–99)
Glucose-Capillary: 126 mg/dL — ABNORMAL HIGH (ref 70–99)
Glucose-Capillary: 130 mg/dL — ABNORMAL HIGH (ref 70–99)
Glucose-Capillary: 138 mg/dL — ABNORMAL HIGH (ref 70–99)
Glucose-Capillary: 143 mg/dL — ABNORMAL HIGH (ref 70–99)
Glucose-Capillary: 144 mg/dL — ABNORMAL HIGH (ref 70–99)

## 2023-12-15 LAB — TRIGLYCERIDES: Triglycerides: 177 mg/dL — ABNORMAL HIGH (ref <150)

## 2023-12-15 LAB — PHOSPHORUS: Phosphorus: 3.4 mg/dL (ref 2.5–4.6)

## 2023-12-15 MED ORDER — ACETAMINOPHEN 500 MG PO TABS: 1000.0000 mg | ORAL_TABLET | Freq: Four times a day (QID) | ORAL | Status: DC

## 2023-12-15 MED ORDER — IOHEXOL 300 MG/ML  SOLN
200.0000 mL | Freq: Once | INTRAMUSCULAR | Status: AC | PRN
  Administered 2023-12-15: 200 mL via ORAL

## 2023-12-15 MED ORDER — ZINC CHLORIDE 1 MG/ML IV SOLN
INTRAVENOUS | Status: AC
  Filled 2023-12-15: qty 694.4

## 2023-12-15 MED ORDER — OXYCODONE HCL 5 MG PO TABS: 5.0000 mg | ORAL_TABLET | ORAL | Status: DC | PRN

## 2023-12-15 NOTE — Progress Notes (Signed)
 NGT removed as ordered. Tolerated well tip intact. [place] ice bar provided

## 2023-12-15 NOTE — Progress Notes (Signed)
 Occupational Therapy Treatment Patient Details Name: Sandra Chung MRN: [id number] DOB: 1960-04-06 Today's Date: 12/15/2023   History of present illness Sandra Chung is a 63 y.o. 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 Presents with small bowel obstruction.  9/16 revealed NG tube appeared to have perforated anterior wall of jejunum. S/p laparotomy, small bowel resection, and repair of jejunal ulceration just distal to gatrojejunostomy on 12/09/23   OT comments  Pt seen for OT treatment on this date. Upon arrival to room pt seated on toilet with mother present, agreeable to tx. Pt endorses she will be staying with her mother at discharge (mom is a retired Char[employer] fundraiser). With modified independence pt completed toilet transfer/pericare, LB dressing via figure-4 method, dynamic standing task with PRN use of IV pole. Pt educated on the discharge planning process, fall prevention techniques, safe ADL completion, pacing self during IADLs and what HH therapy services looks like. Pt retired in bed with MODI, no UE support to return to supine. Pt left with all needs in reach, her mother at her bedside. Pt has made excellent progress on her goals. Discharge recommendations have been updated to reflect pt's progress. No further acute OT needs, please re consult in further acute OT needs arise.      If plan is discharge home, recommend the following:  A little help with walking and/or transfers;Help with stairs or ramp for entrance   Equipment Recommendations  BSC/3in1    Recommendations for Other Services      Precautions / Restrictions Precautions Precautions: None Recall of Precautions/Restrictions: Intact Restrictions Weight Bearing Restrictions Per Provider Order: No       Mobility Bed Mobility Overal bed mobility: Modified Independent              General bed mobility comments: No UE support to return to bed post toileting    Transfers Overall transfer level: Modified independent                 General transfer comment: Pt STS from toilet seat with minimal use of railings, reports she has them at home.     Balance Overall balance assessment: Mild deficits observed, not formally tested             Standing balance comment: Good dynamic standing balance demonstrated when pt navigates IV pole and reaches over head to re plug in after returing from the BR                           ADL either performed or assessed with clinical judgement   ADL Overall ADL's : Modified independent     Grooming: Wash/dry hands;Standing;Modified independent                   Toilet Transfer: Modified Independent;Regular Toilet;Grab bars   Toileting- Clothing Manipulation and Hygiene: Modified independent;Sit to/from stand         General ADL Comments: Pt completed toilet transfer     Communication Communication Communication: No apparent difficulties   Cognition Arousal: Alert Behavior During Therapy: WFL for tasks assessed/performed Cognition: No apparent impairments             OT - Cognition Comments: A?Ox4                 Following commands: Intact  Cueing   Cueing Techniques: Verbal cues, Visual cues  Exercises Exercises: Other exercises Other Exercises Other Exercises: Edu: Role of OT at discharge, home safety/DME needs, fall prevention techniques, HH roles    Shoulder Instructions       General Comments All drains and lines in tact pre/post session    Pertinent Vitals/ Pain       Pain Assessment Pain Assessment: No/denies pain                                                          Frequency  Min 2X/week        Progress Toward Goals  OT Goals(current goals can now be found in the care plan section)  Progress towards OT  goals: Progressing toward goals  Acute Rehab OT Goals OT Goal Formulation: With patient Time For Goal Achievement: 12/25/23 Potential to Achieve Goals: Good ADL Goals Pt Will Perform Grooming: with modified independence;standing Pt Will Perform Lower Body Dressing: with modified independence;sit to/from stand Pt Will Transfer to Toilet: with modified independence;ambulating;regular height toilet   AM-PAC OT 6 Clicks Daily Activity     Outcome Measure   Help from another person eating meals?: None Help from another person taking care of personal grooming?: None Help from another person toileting, which includes using toliet, bedpan, or urinal?: None Help from another person bathing (including washing, rinsing, drying)?: None Help from another person to put on and taking off regular upper body clothing?: None Help from another person to put on and taking off regular lower body clothing?: None 6 Click Score: 24    End of Session Equipment Utilized During Treatment:  (IV pole)  OT Visit Diagnosis: Unsteadiness on feet (R26.81)   Activity Tolerance Patient tolerated treatment well   Patient Left in bed;with call bell/phone within reach;with family/visitor present   Nurse Communication Mobility status        Time: 1459-1510 OT Time Calculation (min): 11 min  Charges: OT General Charges $OT Visit: 1 Visit OT Treatments $Self Care/Home Management : 8-22 mins  Larraine Colas M.S. OTR/L  12/15/23, 3:31 PM

## 2023-12-15 NOTE — Progress Notes (Signed)
  [facility]  Hospital Day(s): 8.   Post op day(s): 6 Days Post-Op.   Interval History:  Patient seen and examined No acute events or new complaints overnight.  Patient reports her biggest issues are the NGT itself and soreness at abdominal drain site No fever, chills, nausea, emesis She does have climbing WBC; 15.6K this AM Hgb to 10.2; stable Slight AKI; sCr - 0.74; UO - unmeasured  NGT with 600 ccs  Surgical drain 70 ccs out; serosanguinous She is on Zosyn  She is NPO + TPN She is having bowel function Plan for UGI today  Vital signs in last 24 hours: [min-max] current  Temp:  [98.3 F (36.8 C)-98.6 F (37 C)] 98.3 F (36.8 C) (09/22 0352) Pulse Rate:  [84-88] 84 (09/22 0352) Resp:  [16-20] 16 (09/22 0352) BP: (125-136)/(70-79) 125/70 (09/22 0352) SpO2:  [96 %-97 %] 96 % (09/22 0352) Weight:  [81.2 kg] 81.2 kg (09/22 0447)     Height: 5' 1 (154.9 cm) Weight: 81.2 kg BMI (Calculated): 33.84   Intake/Output last 2 shifts:  09/21 0701 - 09/22 0700 In: 2530.6 [I.V.:1978.1; IV Piggyback:552.5] Out: 670 [Emesis/NG output:600; Drains:70]   Physical Exam:  Constitutional: alert, cooperative and no distress  HEENT: NGT in place; output seems to be thinning some Respiratory: breathing non-labored at rest  Cardiovascular: regular rate and sinus rhythm  Gastrointestinal: Soft, incisional soreness; distended, no rebound/guarding. Surgical drain in right abdomen; output serous  Integumentary: Laparotomy is CDI with staples; honeycomb removed, no erythema   Labs:     Latest Ref Rng & Units 12/15/2023    5:07 AM 12/14/2023    3:53 AM 12/13/2023    4:11 PM  CBC  WBC 4.0 - 10.5 K/uL 15.6  11.6  8.2   Hemoglobin 12.0 - 15.0 g/dL 89.7  9.9  9.9   Hematocrit 36.0 - 46.0 % 30.9  30.6  29.6   Platelets 150 - 400 K/uL 290  251  227       Latest Ref Rng & Units 12/15/2023    5:07 AM 12/14/2023    3:53 AM 12/13/2023   12:40 PM  CMP  Glucose 70  - 99 mg/dL 873  870  894   BUN 8 - 23 mg/dL 18  16  18    Creatinine 0.44 - 1.00 mg/dL 9.25  9.36  9.41   Sodium 135 - 145 mmol/L 134  138  141   Potassium 3.5 - 5.1 mmol/L 4.0  3.9  4.4   Chloride 98 - 111 mmol/L 101  104  108   CO2 22 - 32 mmol/L 26  27  25    Calcium 8.9 - 10.3 mg/dL 8.4  8.7  8.9   Total Protein 6.5 - 8.1 g/dL 5.7  5.7  5.8   Total Bilirubin 0.0 - 1.2 mg/dL 0.5  0.4  0.8   Alkaline Phos 38 - 126 U/L 52  47  49   AST 15 - 41 U/L 61  24  43   ALT 0 - 44 U/L 53  29  34     Imaging studies: No new pertinent imaging studies   Assessment/Plan:  63 y.o. female 6 Days Post-Op s/p laparotomy, small bowel resection, and repair of jejunal ulceration just distal to gatrojejunostomy    - We will plan to proceed with UGI this morning to reassess repair of perforation. If this is without leak, we can consider removal of NGT and start CLD with caution  -  Continue to TPN for now; hopefully we can start weaning from this if UGI is reassuring and diet advancement is tolerated.   - Monitor leukocytosis; climbing; low threshold to repeat CT if no improvement in this or any clinical concerns  - Continue IV Abx (Zosyn ); Cx from OR without growth  - Continue surgical drain; monitor and record output - She needs PPI; Pantoprazole  80 mg IV BID - Monitor abdominal examination; on-going bowel function - Pain control prn; antiemetics prn  - Mobilize; she has done multiple laps; sitting in chair during the day - Further management per primary service; we will follow    All of the above findings and recommendations were discussed with the patient, and the medical team, and all of patient's questions were answered to her expressed satisfaction.  -- Arthea Platt, PA-C [facility] 12/15/2023, 7:25 AM M-F: 7am - 4pm

## 2023-12-15 NOTE — Progress Notes (Signed)
 PROGRESS NOTE    [facility]  FMW:[id number] DOB: 08-Apr-1960 DOA: 12/07/2023 PCP: Jyl Railing, MD  Chief Complaint  Patient presents with   Abdominal Pain    Hospital Course:  Sandra Chung 63 year old female with GERD, COPD, multiple prior abdominal surgeries including gastric bypass, hysterectomy, cholecystectomy, presented to the hospital with abdominal pain for 2 days with accompanying nausea and vomiting.  She was found to have an SBO and was admitted. She was initially managed conservatively with NG tube and was having bowel movements as well as passing a Gastrografin  challenge however KUB continue to reveal obstructive pattern and she had significant pain.  Repeat CT abdomen pelvis 9/16 revealed NG tube appeared to have perforated anterior wall of jejunum.  Patient went for urgent exploratory laparotomy on 9/16.  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  Subjective: Patient reports she is overall feeling unwell today.  She is also endorsing increased accretions.  She is anxious to have her NG tube removed.  Objective: Vitals:   12/14/23 1933 12/15/23 0352 12/15/23 0447 12/15/23 0827  BP: 129/78 125/70  128/66  Pulse: 88 84  95  Resp: 20 16  (!) 21  Temp: 98.6 F (37 C) 98.3 F (36.8 C)  98.1 F (36.7 C)  TempSrc: Oral Oral    SpO2: 96% 96%  98%  Weight:   81.2 kg   Height:        Intake/Output Summary (Last 24 hours) at 12/15/2023 1607 Last data filed at 12/15/2023 0529 Gross per 24 hour  Intake 2530.62 ml  Output 650 ml  Net 1880.62 ml   Filed Weights   12/13/23 0500 12/14/23 0500 12/15/23 0447  Weight: 82.3 kg 82.3 kg 81.2 kg    Examination: General exam: Appears calm and comfortable, NAD  Respiratory system: No work of breathing, symmetric chest wall expansion Cardiovascular system: S1 & S2 heard, RRR.  Gastrointestinal system:  Abdomen is distended, abdominal binder in place, surgical site is well-healing, no oozing or bleeding at this time. NG tube remains in place.  Abdomen is appropriately tender to palpation.  NG canister with dark green liquid.  Abdominal drain with serous fluid only Neuro: Alert and oriented. No focal neurological deficits. Extremities: Symmetric, expected ROM Skin: No rashes, lesions Psychiatry: Demonstrates appropriate judgement and insight. Mood & affect appropriate for situation.   Assessment & Plan:  Principal Problem:   Small bowel obstruction (HCC) Active Problems:   Perforated ulcer of intestine (HCC)   Small bowel obstruction with perforation - Ex lap 9/16: Laparotomy reduction of internal hernia, small bowel resection with primary stapled anastomosis, repair of jejunal ulceration just distal to gastrojejunostomy - NG tube in place, and placed intraoperatively reportedly past area of perforation - Initially planned to remove NG tube today, upper GI appears to show small perforation.  Awaiting general surgery recommendations - Remains very high risk for ileus but continues to have flatus and bowel movements at this time - Continue with TPN, electrolyte repletion per pharmacy - Will gradually decrease TPN once we are refeeding - Continue with as needed analgesics and antiemetics - Does have rising leukocytosis which may be explained by small perforation.  Continue with Zosyn  - Will also obtain flu/COVID swabs given patient's prolonged hospital stay and overall muscle aches today. - Continue abdominal binder and honeycomb for patient comfort - Encouraged out of bed & mobilization as much as tolerated - PT/OT working  with patient.  Home health needs to be considered but closer to discharge. - Continue to monitor surgical drain output.  COPD, not currently in exacerbation - Continue home dose bronchodilators - Inhalers as needed  BMI 31 Obesity class I - Outpatient follow up for  lifestyle modification and risk factor management  DVT prophylaxis: Subcu heparin  for now.   Code Status: Full Code Disposition: Inpatient pending significant clinical resolution.  Currently on TPN, and NGT  Consultants:  Treatment Team:  Consulting Physician: Marinda Jayson KIDD, MD  Procedures:   Ex lap 9/16  Antimicrobials:  Anti-infectives (From admission, onward)    Start     Dose/Rate Route Frequency Ordered Stop   12/09/23 2300  piperacillin -tazobactam (ZOSYN ) IVPB 3.375 g        3.375 g 12.5 mL/hr over 240 Minutes Intravenous Every 8 hours 12/09/23 1641     12/09/23 1445  piperacillin -tazobactam (ZOSYN ) IVPB 3.375 g        3.375 g 100 mL/hr over 30 Minutes Intravenous  Once 12/09/23 1440 12/09/23 1350   12/09/23 1330  cefoTEtan  (CEFOTAN ) 2 g in sodium chloride  0.9 % 100 mL IVPB        2 g 200 mL/hr over 30 Minutes Intravenous  Once 12/09/23 1316 12/09/23 1524       Data Reviewed: I have personally reviewed following labs and imaging studies CBC: Recent Labs  Lab 12/10/23 0505 12/13/23 1611 12/14/23 0353 12/15/23 0507  WBC 8.1 8.2 11.6* 15.6*  NEUTROABS  --  5.6 8.0* 10.3*  HGB 11.4* 9.9* 9.9* 10.2*  HCT 33.8* 29.6* 30.6* 30.9*  MCV 94.7 93.4 95.0 94.8  PLT 208 227 251 290   Basic Metabolic Panel: Recent Labs  Lab 12/10/23 0505 12/11/23 0441 12/12/23 0559 12/13/23 1240 12/14/23 0353 12/15/23 0507  NA 140 139 140 141 138 134*  K 3.8 3.2* 3.4* 4.4 3.9 4.0  CL 104 105 104 108 104 101  CO2 25 28 28 25 27 26   GLUCOSE 110* 163* 110* 105* 129* 126*  BUN 16 18 20 18 16 18   CREATININE 1.20* 0.95 0.75 0.58 0.63 0.74  CALCIUM 8.7* 8.5* 8.5* 8.9 8.7* 8.4*  MG 1.6* 2.7* 2.2  --  2.0 2.0  PHOS 3.5 1.7* 2.0*  --  2.8 3.4   GFR: Estimated Creatinine Clearance: 68.6 mL/min (by C-G formula based on SCr of 0.74 mg/dL). Liver Function Tests: Recent Labs  Lab 12/11/23 0441 12/13/23 1240 12/14/23 0353 12/15/23 0507  AST 22 43* 24 61*  ALT 20 34 29 53*   ALKPHOS 31* 49 47 52  BILITOT 0.6 0.8 0.4 0.5  PROT 5.6* 5.8* 5.7* 5.7*  ALBUMIN  3.1* 2.7* 2.6* 2.5*   CBG: Recent Labs  Lab 12/14/23 1935 12/14/23 2330 12/15/23 0353 12/15/23 0828 12/15/23 1150  GLUCAP 135* 146* 143* 144* 130*    Recent Results (from the past 240 hours)  Resp panel by RT-PCR (RSV, Flu A&B, Covid) Anterior Nasal Swab     Status: None   Collection Time: 12/07/23  9:05 AM   Specimen: Anterior Nasal Swab  Result Value Ref Range Status   SARS Coronavirus 2 by RT PCR NEGATIVE NEGATIVE Final    Comment: (NOTE) SARS-CoV-2 target nucleic acids are NOT DETECTED.  The SARS-CoV-2 RNA is generally detectable in upper respiratory specimens during the acute phase of infection. The lowest concentration of SARS-CoV-2 viral copies this assay can detect is 138 copies/mL. A negative result does not preclude SARS-Cov-2 infection and should not be used as  the sole basis for treatment or other patient management decisions. A negative result may occur with  improper specimen collection/handling, submission of specimen other than nasopharyngeal swab, presence of viral mutation(s) within the areas targeted by this assay, and inadequate number of viral copies(<138 copies/mL). A negative result must be combined with clinical observations, patient history, and epidemiological information. The expected result is Negative.  Fact Sheet for Patients:  BloggerCourse.com  Fact Sheet for Healthcare Providers:  SeriousBroker.it  This test is no t yet approved or cleared by the [place]  FDA and  has been authorized for detection and/or diagnosis of SARS-CoV-2 by FDA under an Emergency Use Authorization (EUA). This EUA will remain  in effect (meaning this test can be used) for the duration of the COVID-19 declaration under Section 564(b)(1) of the Act, 21 U.S.C.section 360bbb-3(b)(1), unless the authorization is terminated  or  revoked sooner.       Influenza A by PCR NEGATIVE NEGATIVE Final   Influenza B by PCR NEGATIVE NEGATIVE Final    Comment: (NOTE) The Xpert Xpress SARS-CoV-2/FLU/RSV plus assay is intended as an aid in the diagnosis of influenza from Nasopharyngeal swab specimens and should not be used as a sole basis for treatment. Nasal washings and aspirates are unacceptable for Xpert Xpress SARS-CoV-2/FLU/RSV testing.  Fact Sheet for Patients: BloggerCourse.com  Fact Sheet for Healthcare Providers: SeriousBroker.it  This test is not yet approved or cleared by the [place]  FDA and has been authorized for detection and/or diagnosis of SARS-CoV-2 by FDA under an Emergency Use Authorization (EUA). This EUA will remain in effect (meaning this test can be used) for the duration of the COVID-19 declaration under Section 564(b)(1) of the Act, 21 U.S.C. section 360bbb-3(b)(1), unless the authorization is terminated or revoked.     Resp Syncytial Virus by PCR NEGATIVE NEGATIVE Final    Comment: (NOTE) Fact Sheet for Patients: BloggerCourse.com  Fact Sheet for Healthcare Providers: SeriousBroker.it  This test is not yet approved or cleared by the [place]  FDA and has been authorized for detection and/or diagnosis of SARS-CoV-2 by FDA under an Emergency Use Authorization (EUA). This EUA will remain in effect (meaning this test can be used) for the duration of the COVID-19 declaration under Section 564(b)(1) of the Act, 21 U.S.C. section 360bbb-3(b)(1), unless the authorization is terminated or revoked.  Performed at [facility], [address] [place], [place] [address]   Aerobic/Anaerobic Culture w Gram Stain (surgical/deep wound)     Status: None   Collection Time: 12/09/23  2:28 PM   Specimen: Path fluid; Body Fluid  Result Value Ref Range Status   Specimen Description    Final    WOUND Performed at [facility], [address] [place], [place] [address]    Special Requests   Final    INTRA ABDOMINAL FLUID Performed at [facility], [address] [place], [place] [address]    Gram Stain   Final    FEW WBC PRESENT, PREDOMINANTLY PMN NO ORGANISMS SEEN    Culture   Final    No growth aerobically or anaerobically. Performed at [facility], [address], [place], [place] [address]    Report Status 12/14/2023 FINAL  Final     Radiology Studies: DG UGI W SINGLE CM (SOL OR THIN BA) Result Date: 12/15/2023 CLINICAL DATA:  63 year old female with history of gastric bypass complicated by perforated ulcer status post surgical repair by Dr. Jordis on 9/16. IR requested for anastomosis  integrity check with contrasted study. EXAM: DG UGI W SINGLE CM TECHNIQUE: Single contrast examination was then performed using thin liquid barium. This exam was performed by Carlin Griffon, PA-C, and was supervised and interpreted by Selinda Blue, MD. FLUOROSCOPY: Radiation Exposure Index (as provided by the fluoroscopic device): 46.40 mGy Kerma COMPARISON:  CT abdomen pelvis with contrast media dated 12/09/2023. FINDINGS: Scout radiograph demonstrates skin staples vertically in the medial right abdomen and cholecystectomy clips in the right upper quadrant. Enteric tube courses into the right abdomen with tip overlying the midline. Dilated small bowel loops in the visualized upper abdomen compatible with postoperative ileus. Patient is status post gastric bypass surgery with gastrojejunal anastomosis. There is a hiatal hernia involving the remnant gastric pouch. The gastrojejunal anastomosis is widely patent and without leak at this site. In the proximal portion of the efferent jejunal limb (approximately 4-5 cm dowstream of the gastrojejunal anastomosis), there is evidence of a small contained perforation with an approximately 2 x 1 cm outpouching of contrast from the  left posterior margin of the jejunal lumen, which correlates with the site of the NG tube perforation through the proximal efferent jejunal limb on the recent CT study. There is no associated jejunal stricture. Oral contrast transits to the mid jejunum. IMPRESSION: Evidence of a small contained perforation in the proximal portion of the efferent jejunal limb (located approximately 4-5 cm downstream of the gastrojejunal anastomosis), correlating with the site of perforation on the recent CT study from 12/09/23. NG tube is well positioned with the tip in the jejunum well beyond the site of perforation. Dilated small bowel loops in the upper abdomen compatible with postoperative ileus. These results will be called to the ordering clinician or representative by the Radiologist Assistant, and communication documented in the PACS or [employer]. Electronically Signed   By: Selinda DELENA Blue M.D.   On: 12/15/2023 15:42     Scheduled Meds:  acetaminophen   1,000 mg Oral Q6H   budesonide -glycopyrrolate -formoterol   2 puff Inhalation BID   Chlorhexidine  Gluconate Cloth  6 each Topical Daily   cyanocobalamin   1,000 mcg Intramuscular Daily   heparin  injection (subcutaneous)  5,000 Units Subcutaneous Q8H   insulin  aspart  0-9 Units Subcutaneous Q4H   montelukast   10 mg Oral QHS   pantoprazole  (PROTONIX ) IV  80 mg Intravenous Q12H   scopolamine   1 patch Transdermal Q72H   sodium chloride  flush  10-40 mL Intracatheter Q12H   thiamine  (VITAMIN B1) injection  100 mg Intravenous Q24H   Continuous Infusions:  copper  chloride 4 mg in dextrose  5 % 100 mL IVPB Stopped (12/14/23 2101)   piperacillin -tazobactam (ZOSYN )  IV Stopped (12/15/23 1000)   promethazine  (PHENERGAN ) injection (IM or IVPB) Stopped (12/09/23 1118)   TPN ADULT (ION) 70 mL/hr at 12/15/23 0529   TPN ADULT (ION)       LOS: 8 days  MDM: Patient is high risk for one or more organ failure.  They necessitate ongoing hospitalization for continued IV  therapies and subsequent lab monitoring. Total time spent interpreting labs and vitals, reviewing the medical record, coordinating care amongst consultants and care team members, directly assessing and discussing care with the patient and/or family: 55 min Astou Lada, DO [facility]  To contact the attending physician between 7A-7P please use Epic Chat. To contact the covering physician during after hours 7P-7A, please review Amion.  12/15/2023, 4:07 PM   *This document has been created with the assistance of dictation software. Please excuse typographical errors. *

## 2023-12-15 NOTE — Consult Note (Signed)
 PHARMACY - TOTAL PARENTERAL NUTRITION CONSULT NOTE   Indication: Prolonged ileus  Patient Measurements: Height: 5' 1 (154.9 cm) Weight: 81.2 kg (179 lb 0.2 oz) IBW/kg (Calculated) : 47.8 TPN AdjBW (KG): 54.6 Body mass index is 33.82 kg/m. Usual Weight: 75.1 kg  Assessment:  63 yo F with PMH including GERD, extensive abdominal surgical history (including gastric bypass, hysterectomy, cholecystectomy) is presenting with SBO and perforation s/p resection and repair on 9/16.  Glucose / Insulin : no history of diabetes BG 103-146 5u insulin  in past 24 hours Electrolytes:  Na 134 Renal: Scr 0.9>>>0.63>0.74 Hepatic:  LFTs: AST 61, ALT 53 TG 177 (reducing Lipids from 35 g/L to 25 g/L, 9/22) Intake / Output; MIVF:   Intake/Output Summary (Last 24 hours) at 12/15/2023 0719 Last data filed at 12/15/2023 9470 Gross per 24 hour  Intake 2530.62 ml  Output 670 ml  Net 1860.62 ml    GI Imaging: 9/16 CTAP: Persistent high-grade small bowel obstruction, likely due to adhesions, but completely small-bowel obstruction is excluded. Significant dilation of the excluded stomach from enterogastric reflux.  GI Surgeries / Procedures:  9/16 OR: 1. Laparotomy reduction of internal hernia 2. Small bowel resection with primary stapled anastomosis 3. Repair of jejunal ulceration just distal to gatrojejunostomy  Central access: PICC to be placed 9/17 TPN start date: 9/17  Nutritional Goals: Goal TPN rate is 70 mL/hr (provides 104 g of protein and 1865 kcals per day)  RD Assessment: RD consulted, communicated: 1800-2100kcal/day, 90-105g/day protein and 1.5-1.7L/day fluid, she will need thiamine  for 7 days due to history of gastric bypass  Current Nutrition:  NPO  Plan:  Increase TPN to 70 mL/hr at 1800 Electrolytes in TPN: Na 30mEq/L, K 41mEq/L, Ca 40mEq/L, Mg 31mEq/L, and Phos 15mmol/L. Cl:Ac 1:1 Add standard MVI and trace elements to TPN Initiate Sensitive q4h SSI and adjust as  needed Initiate thiamine  100mg  IV daily x 7 days (administering outside TPN) Add zinc  per RD.  Monitor TPN labs on Mon/Thurs Recheck TG and CMP tomorrow  Kayla Mose Niels DOUGLAS, PharmD, BCPS Clinical Pharmacist 12/15/2023 7:19 AM

## 2023-12-15 NOTE — Plan of Care (Signed)

## 2023-12-15 NOTE — Progress Notes (Signed)
 Mobility Specialist - Progress Note   12/15/23 1428  Mobility  Activity Ambulated with assistance  Level of Assistance Standby assist, set-up cues, supervision of patient - no hands on  Assistive Device None (pushing IV pole)  Distance Ambulated (ft) 320 ft  Activity Response Tolerated well  Mobility visit 1 Mobility  Mobility Specialist Start Time (ACUTE ONLY) 1407  Mobility Specialist Stop Time (ACUTE ONLY) 1423  Mobility Specialist Time Calculation (min) (ACUTE ONLY) 16 min   Pt sitting on the commode upon entry, utilizing RA. Pt amb two laps around the NS with sup, tolerated well. Pt expressed less pain this date. Pt returned to the room, left in fowler position w/ alarm set and needs within reach.  America Silvan Mobility Specialist 12/15/23 2:36 PM

## 2023-12-16 ENCOUNTER — Inpatient Hospital Stay

## 2023-12-16 DIAGNOSIS — K56609 Unspecified intestinal obstruction, unspecified as to partial versus complete obstruction: Secondary | ICD-10-CM | POA: Diagnosis not present

## 2023-12-16 LAB — COMPREHENSIVE METABOLIC PANEL WITH GFR
ALT: 128 U/L — ABNORMAL HIGH (ref 0–44)
AST: 110 U/L — ABNORMAL HIGH (ref 15–41)
Albumin: 2.4 g/dL — ABNORMAL LOW (ref 3.5–5.0)
Alkaline Phosphatase: 69 U/L (ref 38–126)
Anion gap: 7 (ref 5–15)
BUN: 20 mg/dL (ref 8–23)
CO2: 25 mmol/L (ref 22–32)
Calcium: 8.5 mg/dL — ABNORMAL LOW (ref 8.9–10.3)
Chloride: 105 mmol/L (ref 98–111)
Creatinine, Ser: 0.86 mg/dL (ref 0.44–1.00)
GFR, Estimated: >60 mL/min (ref >60)
Glucose, Bld: 145 mg/dL — ABNORMAL HIGH (ref 70–99)
Potassium: 4.3 mmol/L (ref 3.5–5.1)
Sodium: 137 mmol/L (ref 135–145)
Total Bilirubin: 0.5 mg/dL (ref 0.0–1.2)
Total Protein: 5.4 g/dL — ABNORMAL LOW (ref 6.5–8.1)

## 2023-12-16 LAB — CBC WITH DIFFERENTIAL/PLATELET
Abs Immature Granulocytes: 0.88 K/uL — ABNORMAL HIGH (ref 0.00–0.07)
Basophils Absolute: 0 K/uL (ref 0.0–0.1)
Basophils Relative: 0 %
Eosinophils Absolute: 1 K/uL — ABNORMAL HIGH (ref 0.0–0.5)
Eosinophils Relative: 7 %
HCT: 30 % — ABNORMAL LOW (ref 36.0–46.0)
Hemoglobin: 9.6 g/dL — ABNORMAL LOW (ref 12.0–15.0)
Immature Granulocytes: 7 %
Lymphocytes Relative: 10 %
Lymphs Abs: 1.3 K/uL (ref 0.7–4.0)
MCH: 30.7 pg (ref 26.0–34.0)
MCHC: 32 g/dL (ref 30.0–36.0)
MCV: 95.8 fL (ref 80.0–100.0)
Monocytes Absolute: 1.7 K/uL — ABNORMAL HIGH (ref 0.1–1.0)
Monocytes Relative: 13 %
Neutro Abs: 8.3 K/uL — ABNORMAL HIGH (ref 1.7–7.7)
Neutrophils Relative %: 63 %
Platelets: 305 K/uL (ref 150–400)
RBC: 3.13 MIL/uL — ABNORMAL LOW (ref 3.87–5.11)
RDW: 12.7 % (ref 11.5–15.5)
Smear Review: NORMAL
WBC: 13.1 K/uL — ABNORMAL HIGH (ref 4.0–10.5)
nRBC: 0 % (ref 0.0–0.2)

## 2023-12-16 LAB — GLUCOSE, CAPILLARY
Glucose-Capillary: 138 mg/dL — ABNORMAL HIGH (ref 70–99)
Glucose-Capillary: 114 mg/dL — ABNORMAL HIGH (ref 70–99)
Glucose-Capillary: 109 mg/dL — ABNORMAL HIGH (ref 70–99)
Glucose-Capillary: 122 mg/dL — ABNORMAL HIGH (ref 70–99)
Glucose-Capillary: 120 mg/dL — ABNORMAL HIGH (ref 70–99)
Glucose-Capillary: 133 mg/dL — ABNORMAL HIGH (ref 70–99)

## 2023-12-16 LAB — MAGNESIUM: Magnesium: 2.1 mg/dL (ref 1.7–2.4)

## 2023-12-16 LAB — PHOSPHORUS: Phosphorus: 3.3 mg/dL (ref 2.5–4.6)

## 2023-12-16 MED ORDER — ZINC CHLORIDE 1 MG/ML IV SOLN
INTRAVENOUS | Status: AC
  Filled 2023-12-16: qty 694.4

## 2023-12-16 MED ORDER — IOHEXOL 9 MG/ML PO SOLN
500.0000 mL | ORAL | Status: AC
  Administered 2023-12-16: 500 mL via ORAL

## 2023-12-16 MED ORDER — IOHEXOL 300 MG/ML  SOLN
100.0000 mL | Freq: Once | INTRAMUSCULAR | Status: AC | PRN
  Administered 2023-12-16: 100 mL via INTRAVENOUS

## 2023-12-16 NOTE — Progress Notes (Addendum)
 [facility] SURGICAL PROGRESS NOTE  Hospital Day(s): 9.   Post op day(s): 7 Days Post-Op.   Interval History:  Patient seen and examined No acute events or new complaints overnight.  She actually looks the best Ive seen No significant abdominal pain No fever, chills, nausea, emesis WBC improved this AM; WBC 13.1K Hgb to 9.6; stable Renal function normal; sCr - 0.86; UO - unmeasured  NGT is out Surgical drain 30 ccs out; this remains serous  She is NPO + TPN She is having bowel function   Vital signs in last 24 hours: [min-max] current  Temp:  [98.1 F (36.7 C)-99 F (37.2 C)] 98.7 F (37.1 C) (09/23 0313) Pulse Rate:  [85-95] 85 (09/23 0313) Resp:  [16-21] 16 (09/23 0313) BP: (112-129)/(66-70) 120/70 (09/23 0313) SpO2:  [97 %-99 %] 97 % (09/23 0313) Weight:  [79.4 kg] 79.4 kg (09/23 0313)     Height: 5' 1 (154.9 cm) Weight: 79.4 kg BMI (Calculated): 33.09   Intake/Output last 2 shifts:  09/22 0701 - 09/23 0700 In: 1831 [I.V.:1590.5; IV Piggyback:240.5] Out: 280 [Emesis/NG output:250; Drains:30]   Physical Exam:  Constitutional: alert, cooperative and no distress  Respiratory: breathing non-labored at rest  Cardiovascular: regular rate and sinus rhythm  Gastrointestinal: Soft, incisional soreness; distension improving, no rebound/guarding. Surgical drain in right abdomen; output serous  Integumentary: Laparotomy is CDI with staples, no erythema   Labs:     Latest Ref Rng & Units 12/16/2023    4:16 AM 12/15/2023    5:07 AM 12/14/2023    3:53 AM  CBC  WBC 4.0 - 10.5 K/uL 13.1  15.6  11.6   Hemoglobin 12.0 - 15.0 g/dL 9.6  89.7  9.9   Hematocrit 36.0 - 46.0 % 30.0  30.9  30.6   Platelets 150 - 400 K/uL 305  290  251       Latest Ref Rng & Units 12/16/2023    4:16 AM 12/15/2023    5:07 AM 12/14/2023    3:53 AM  CMP  Glucose 70 - 99 mg/dL 854  873  870   BUN 8 - 23 mg/dL 20  18  16    Creatinine 0.44 - 1.00 mg/dL 9.13  9.25  9.36   Sodium 135 -  145 mmol/L 137  134  138   Potassium 3.5 - 5.1 mmol/L 4.3  4.0  3.9   Chloride 98 - 111 mmol/L 105  101  104   CO2 22 - 32 mmol/L 25  26  27    Calcium 8.9 - 10.3 mg/dL 8.5  8.4  8.7   Total Protein 6.5 - 8.1 g/dL 5.4  5.7  5.7   Total Bilirubin 0.0 - 1.2 mg/dL 0.5  0.5  0.4   Alkaline Phos 38 - 126 U/L 69  52  47   AST 15 - 41 U/L 110  61  24   ALT 0 - 44 U/L 128  53  29     Imaging studies:   UGI (12/15/2023) reviewed with concern for small contained leak, and radiologist report reviewed below:  IMPRESSION: Evidence of a small contained perforation in the proximal portion of the efferent jejunal limb (located approximately 4-5 cm downstream of the gastrojejunal anastomosis), correlating with the site of perforation on the recent CT study from 12/09/23.   NG tube is well positioned with the tip in the jejunum well beyond the site of perforation.   Dilated small bowel loops in the upper abdomen compatible  with postoperative ileus.   These results will be called to the ordering clinician or representative by the Radiologist Assistant, and communication documented in the PACS or [employer].    Assessment/Plan:  63 y.o. female 7 Days Post-Op s/p laparotomy, small bowel resection, and repair of jejunal ulceration just distal to gatrojejunostomy    - Unfortunately, UGI yesterday was concerning for small contained perforation. She no longer has NG. We will plan to repeat CT Abdomen/Pelvis this morning with PO and IV contrast to reassess this intra-abdominal process.   - We may also give colored drink (no red, yellow, orange) to assess her drain and evaluation this potential leak. Will check with dieticians regarding options   - Continue to TPN for now; Still unclear when we will be able to resume PO intake and she is now without enteral feeding option  - Monitor leukocytosis; improved  - Continue IV Abx (Zosyn ); Cx from OR without growth - Please do not stop yet given concern  for perforation on UGI  - Continue surgical drain; monitor and record output closely  - Continue PPI; Pantoprazole  80 mg IV BID - Monitor abdominal examination; on-going bowel function - Pain control prn; antiemetics prn  - Mobilize; has done well with this  - Further management per primary service; we will follow    All of the above findings and recommendations were discussed with the patient, and the medical team, and all of patient's questions were answered to her expressed satisfaction.  -- Arthea Platt, PA-C [facility] 12/16/2023, 7:37 AM M-F: 7am - 4pm

## 2023-12-16 NOTE — Plan of Care (Signed)

## 2023-12-16 NOTE — Progress Notes (Signed)
 Physical Therapy Treatment Patient Details Name: Sandra Chung MRN: [id number] DOB: June 29, 1960 Today's Date: 12/16/2023   History of Present Illness Sandra Chung is a 63 y.o. 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 Presents with small bowel obstruction.  9/16 revealed NG tube appeared to have perforated anterior wall of jejunum. S/p laparotomy, small bowel resection, and repair of jejunal ulceration just distal to gatrojejunostomy on 12/09/23    PT Comments  Pt was seated EOB upon arrival. She remains A and O x 4. Agreeable to session and motivated to improve. She stood and ambulated ~ 100 ft with IV pole prior to ambulating ~ 500 additional ft without UE support or AD. Pt tolerated ambulation well. Did discuss post acute PT needs. Pt agreeable to HHPT but may progress to not needing any follow up if she continues to progress quickly. Acute PT will continue to follow per current POC.    If plan is discharge home, recommend the following: A little help with walking and/or transfers;A little help with bathing/dressing/bathroom;Assistance with cooking/housework;Assist for transportation;Help with stairs or ramp for entrance     Equipment Recommendations  None recommended by PT (pt endorses having rollator at home already)       Precautions / Restrictions Precautions Precautions: None Recall of Precautions/Restrictions: Intact Restrictions Weight Bearing Restrictions Per Provider Order: No     Mobility  Bed Mobility Overal bed mobility: Modified Independent     Transfers Overall transfer level: Modified independent Equipment used: Rolling walker (2 wheels) Transfers: Sit to/from Stand Sit to Stand: Supervision   Ambulation/Gait Ambulation/Gait assistance: Supervision Gait Distance (Feet): 600 Feet Assistive device: None, IV  Pole Gait Pattern/deviations: Step-through pattern  General Gait Details: Pt ambulated ~ 100 ft pushing IV pole prior to ambulation without UE support x ~ 500 additional ft. NO LOB or safety concerns. Did discuss post admission needs. Pt is agreeable to [facility] however may progress to OP or no follow up PT depending on progress over next few sessions    Balance Overall balance assessment: Needs assistance Sitting-balance support: Feet supported Sitting balance-Leahy Scale: Good     Standing balance support: During functional activity, No upper extremity supported Standing balance-Leahy Scale: Good       Communication Communication Communication: No apparent difficulties  Cognition Arousal: Alert Behavior During Therapy: WFL for tasks assessed/performed   PT - Cognitive impairments: No apparent impairments    PT - Cognition Comments: A&Ox4, pleasant Following commands: Intact      Cueing Cueing Techniques: Verbal cues, Visual cues         Pertinent Vitals/Pain Pain Assessment Pain Assessment: 0-10 Pain Score: 2  Pain Location: abdomen Pain Descriptors / Indicators: Discomfort Pain Intervention(s): Limited activity within patient's tolerance, Monitored during session, Repositioned, Premedicated before session     PT Goals (current goals can now be found in the care plan section) Acute Rehab PT Goals Patient Stated Goal: to go home (mom house first) Progress towards PT goals: Progressing toward goals    Frequency    Min 2X/week       AM-PAC PT 6 Clicks Mobility   Outcome Measure  Help needed turning from your back to your side while in a flat bed without using bedrails?: None Help needed moving from lying on your back to sitting on the side of a flat bed without using bedrails?: None Help needed moving to  and from a bed to a chair (including a wheelchair)?: None Help needed standing up from a chair using your arms (e.g., wheelchair or bedside chair)?: A  Little Help needed to walk in hospital room?: A Little Help needed climbing 3-5 steps with a railing? : A Little 6 Click Score: 21    End of Session   Activity Tolerance: Patient tolerated treatment well Patient left: in bed;with call bell/phone within reach Nurse Communication: Mobility status PT Visit Diagnosis: Unsteadiness on feet (R26.81);Other abnormalities of gait and mobility (R26.89);Muscle weakness (generalized) (M62.81);Pain     Time: 8687-8667 PT Time Calculation (min) (ACUTE ONLY): 20 min  Charges:    $Gait Training: 8-22 mins PT General Charges $$ ACUTE PT VISIT: 1 Visit                     Rankin Essex PTA 12/16/23, 4:40 PM

## 2023-12-16 NOTE — Progress Notes (Signed)
 PROGRESS NOTE    Zykeria Laguardia [facility]  FMW:[id number] DOB: 06/19/1960 DOA: 12/07/2023 PCP: Jyl Railing, MD  Chief Complaint  Patient presents with   Abdominal Pain    Hospital Course:  AJANAY FARVE 63 year old female with GERD, COPD, multiple prior abdominal surgeries including gastric bypass, hysterectomy, cholecystectomy, presented to the hospital with abdominal pain for 2 days with accompanying nausea and vomiting.  She was found to have an SBO and was admitted. She was initially managed conservatively with NG tube and was having bowel movements as well as passing a Gastrografin  challenge however KUB continue to reveal obstructive pattern and she had significant pain.  Repeat CT abdomen pelvis 9/16 revealed NG tube appeared to have perforated anterior wall of jejunum.  Patient went for urgent exploratory laparotomy on 9/16.  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  Postoperatively patient was kept with NG tube until 9/22 at which time NG tube was removed.  Upper GI then revealed small perforation.  General surgery has recommended to keep patient on strict n.p.o. with continue TPN.  They are planning to repeat contrasted study in 2 days.  Subjective: Overall patient reports she is feeling much better now.  Continues to have flatus and bowel movements.  She reports getting the NG tube out has provided her significant relief.  Objective: Vitals:   12/15/23 2055 12/16/23 0313 12/16/23 0817 12/16/23 1430  BP: 112/67 120/70 117/71 109/67  Pulse: 88 85 81 84  Resp: 16 16 18 15   Temp: 99 F (37.2 C) 98.7 F (37.1 C) 98.1 F (36.7 C) 98.7 F (37.1 C)  TempSrc: Oral Oral    SpO2: 99% 97% 100% 97%  Weight:  79.4 kg    Height:        Intake/Output Summary (Last 24 hours) at 12/16/2023 1506 Last data filed at 12/16/2023 1254 Gross per 24 hour  Intake 2409.6 ml   Output --  Net 2409.6 ml   Filed Weights   12/14/23 0500 12/15/23 0447 12/16/23 0313  Weight: 82.3 kg 81.2 kg 79.4 kg    Examination: General exam: Appears calm and comfortable, NAD  Respiratory system: No work of breathing, symmetric chest wall expansion Cardiovascular system: S1 & S2 heard, RRR.  Gastrointestinal system: Abdomen is distended, abdominal binder in place, surgical site is well-healing, no oozing or bleeding at this time. NG tube remains in place.  Abdomen is appropriately tender to palpation.  NG canister with dark green liquid.  Abdominal drain with serous fluid only Neuro: Alert and oriented. No focal neurological deficits. Extremities: Symmetric, expected ROM Skin: No rashes, lesions Psychiatry: Demonstrates appropriate judgement and insight. Mood & affect appropriate for situation.   Assessment & Plan:  Principal Problem:   Small bowel obstruction (HCC) Active Problems:   Perforated ulcer of intestine (HCC)   Small bowel obstruction with perforation - Ex lap 9/16: Laparotomy reduction of internal hernia, small bowel resection with primary stapled anastomosis, repair of jejunal ulceration just distal to gastrojejunostomy - NG tube was placed intraoperatively past the area of perforation.  Was removed on 9/22 - 9/22 upper GI shows area of small perforation - CT with con today shows no contrast extravasation. - Keep strict n.p.o. - Plan to repeat contrast study Thursday, if perforation still evident at that time will require transfer for advanced endoscopy and potential endoscopic repair versus revision. - Appreciate further recommendations from general surgery team - Continue with TPN,  electrolyte repletion per pharmacy - Will gradually decrease TPN once we are refeeding - Continue with as needed analgesics and antiemetics - Still leukocytosis though improving some.  Keep Zosyn  given concern of perforation - Continue abdominal binder for patient comfort -  Encouraged out of bed & mobilization as much as tolerated - PT/OT working with patient.  Home health needs to be considered but closer to discharge. - Continue to monitor surgical drain output.  COPD, not currently in exacerbation - Continue home dose bronchodilators - Inhalers as needed  BMI 31 Obesity class I - Outpatient follow up for lifestyle modification and risk factor management  DVT prophylaxis: Subcu heparin  for now.   Code Status: Full Code Disposition: Inpatient pending significant clinical resolution.  Currently on TPN.  Consultants:  Treatment Team:  Consulting Physician: Marinda Jayson KIDD, MD  Procedures:   Ex lap 9/16  Antimicrobials:  Anti-infectives (From admission, onward)    Start     Dose/Rate Route Frequency Ordered Stop   12/09/23 2300  piperacillin -tazobactam (ZOSYN ) IVPB 3.375 g        3.375 g 12.5 mL/hr over 240 Minutes Intravenous Every 8 hours 12/09/23 1641     12/09/23 1445  piperacillin -tazobactam (ZOSYN ) IVPB 3.375 g        3.375 g 100 mL/hr over 30 Minutes Intravenous  Once 12/09/23 1440 12/09/23 1350   12/09/23 1330  cefoTEtan  (CEFOTAN ) 2 g in sodium chloride  0.9 % 100 mL IVPB        2 g 200 mL/hr over 30 Minutes Intravenous  Once 12/09/23 1316 12/09/23 1524       Data Reviewed: I have personally reviewed following labs and imaging studies CBC: Recent Labs  Lab 12/10/23 0505 12/13/23 1611 12/14/23 0353 12/15/23 0507 12/16/23 0416  WBC 8.1 8.2 11.6* 15.6* 13.1*  NEUTROABS  --  5.6 8.0* 10.3* 8.3*  HGB 11.4* 9.9* 9.9* 10.2* 9.6*  HCT 33.8* 29.6* 30.6* 30.9* 30.0*  MCV 94.7 93.4 95.0 94.8 95.8  PLT 208 227 251 290 305   Basic Metabolic Panel: Recent Labs  Lab 12/11/23 0441 12/12/23 0559 12/13/23 1240 12/14/23 0353 12/15/23 0507 12/16/23 0416  NA 139 140 141 138 134* 137  K 3.2* 3.4* 4.4 3.9 4.0 4.3  CL 105 104 108 104 101 105  CO2 28 28 25 27 26 25   GLUCOSE 163* 110* 105* 129* 126* 145*  BUN 18 20 18 16 18 20    CREATININE 0.95 0.75 0.58 0.63 0.74 0.86  CALCIUM 8.5* 8.5* 8.9 8.7* 8.4* 8.5*  MG 2.7* 2.2  --  2.0 2.0 2.1  PHOS 1.7* 2.0*  --  2.8 3.4 3.3   GFR: Estimated Creatinine Clearance: 63 mL/min (by C-G formula based on SCr of 0.86 mg/dL). Liver Function Tests: Recent Labs  Lab 12/11/23 0441 12/13/23 1240 12/14/23 0353 12/15/23 0507 12/16/23 0416  AST 22 43* 24 61* 110*  ALT 20 34 29 53* 128*  ALKPHOS 31* 49 47 52 69  BILITOT 0.6 0.8 0.4 0.5 0.5  PROT 5.6* 5.8* 5.7* 5.7* 5.4*  ALBUMIN  3.1* 2.7* 2.6* 2.5* 2.4*   CBG: Recent Labs  Lab 12/15/23 2051 12/15/23 2350 12/16/23 0310 12/16/23 0815 12/16/23 1135  GLUCAP 126* 138* 138* 114* 109*    Recent Results (from the past 240 hours)  Resp panel by RT-PCR (RSV, Flu A&B, Covid) Anterior Nasal Swab     Status: None   Collection Time: 12/07/23  9:05 AM   Specimen: Anterior Nasal Swab  Result Value Ref Range  Status   SARS Coronavirus 2 by RT PCR NEGATIVE NEGATIVE Final    Comment: (NOTE) SARS-CoV-2 target nucleic acids are NOT DETECTED.  The SARS-CoV-2 RNA is generally detectable in upper respiratory specimens during the acute phase of infection. The lowest concentration of SARS-CoV-2 viral copies this assay can detect is 138 copies/mL. A negative result does not preclude SARS-Cov-2 infection and should not be used as the sole basis for treatment or other patient management decisions. A negative result may occur with  improper specimen collection/handling, submission of specimen other than nasopharyngeal swab, presence of viral mutation(s) within the areas targeted by this assay, and inadequate number of viral copies(<138 copies/mL). A negative result must be combined with clinical observations, patient history, and epidemiological information. The expected result is Negative.  Fact Sheet for Patients:  BloggerCourse.com  Fact Sheet for Healthcare Providers:   SeriousBroker.it  This test is no t yet approved or cleared by the [place]  FDA and  has been authorized for detection and/or diagnosis of SARS-CoV-2 by FDA under an Emergency Use Authorization (EUA). This EUA will remain  in effect (meaning this test can be used) for the duration of the COVID-19 declaration under Section 564(b)(1) of the Act, 21 U.S.C.section 360bbb-3(b)(1), unless the authorization is terminated  or revoked sooner.       Influenza A by PCR NEGATIVE NEGATIVE Final   Influenza B by PCR NEGATIVE NEGATIVE Final    Comment: (NOTE) The Xpert Xpress SARS-CoV-2/FLU/RSV plus assay is intended as an aid in the diagnosis of influenza from Nasopharyngeal swab specimens and should not be used as a sole basis for treatment. Nasal washings and aspirates are unacceptable for Xpert Xpress SARS-CoV-2/FLU/RSV testing.  Fact Sheet for Patients: BloggerCourse.com  Fact Sheet for Healthcare Providers: SeriousBroker.it  This test is not yet approved or cleared by the [place]  FDA and has been authorized for detection and/or diagnosis of SARS-CoV-2 by FDA under an Emergency Use Authorization (EUA). This EUA will remain in effect (meaning this test can be used) for the duration of the COVID-19 declaration under Section 564(b)(1) of the Act, 21 U.S.C. section 360bbb-3(b)(1), unless the authorization is terminated or revoked.     Resp Syncytial Virus by PCR NEGATIVE NEGATIVE Final    Comment: (NOTE) Fact Sheet for Patients: BloggerCourse.com  Fact Sheet for Healthcare Providers: SeriousBroker.it  This test is not yet approved or cleared by the [place]  FDA and has been authorized for detection and/or diagnosis of SARS-CoV-2 by FDA under an Emergency Use Authorization (EUA). This EUA will remain in effect (meaning this test can be used) for  the duration of the COVID-19 declaration under Section 564(b)(1) of the Act, 21 U.S.C. section 360bbb-3(b)(1), unless the authorization is terminated or revoked.  Performed at [facility], [address] [place], [place] [address]   Aerobic/Anaerobic Culture w Gram Stain (surgical/deep wound)     Status: None   Collection Time: 12/09/23  2:28 PM   Specimen: Path fluid; Body Fluid  Result Value Ref Range Status   Specimen Description   Final    WOUND Performed at [facility], [address] [place] [address], [place], [place] [address]    Special Requests   Final    INTRA ABDOMINAL FLUID Performed at [facility], [address] [place], [place] [address]    Gram Stain   Final    FEW WBC PRESENT, PREDOMINANTLY PMN NO ORGANISMS SEEN    Culture   Final    No growth aerobically  or anaerobically. Performed at [facility], [address] [place], [place] [address]    Report Status 12/14/2023 FINAL  Final  Resp panel by RT-PCR (RSV, Flu A&B, Covid) Anterior Nasal Swab     Status: None   Collection Time: 12/15/23  7:33 PM   Specimen: Anterior Nasal Swab  Result Value Ref Range Status   SARS Coronavirus 2 by RT PCR NEGATIVE NEGATIVE Final    Comment: (NOTE) SARS-CoV-2 target nucleic acids are NOT DETECTED.  The SARS-CoV-2 RNA is generally detectable in upper respiratory specimens during the acute phase of infection. The lowest concentration of SARS-CoV-2 viral copies this assay can detect is 138 copies/mL. A negative result does not preclude SARS-Cov-2 infection and should not be used as the sole basis for treatment or other patient management decisions. A negative result may occur with  improper specimen collection/handling, submission of specimen other than nasopharyngeal swab, presence of viral mutation(s) within the areas targeted by this assay, and inadequate number of viral copies(<138 copies/mL). A negative result must be combined with clinical  observations, patient history, and epidemiological information. The expected result is Negative.  Fact Sheet for Patients:  BloggerCourse.com  Fact Sheet for Healthcare Providers:  SeriousBroker.it  This test is no t yet approved or cleared by the [place]  FDA and  has been authorized for detection and/or diagnosis of SARS-CoV-2 by FDA under an Emergency Use Authorization (EUA). This EUA will remain  in effect (meaning this test can be used) for the duration of the COVID-19 declaration under Section 564(b)(1) of the Act, 21 U.S.C.section 360bbb-3(b)(1), unless the authorization is terminated  or revoked sooner.       Influenza A by PCR NEGATIVE NEGATIVE Final   Influenza B by PCR NEGATIVE NEGATIVE Final    Comment: (NOTE) The Xpert Xpress SARS-CoV-2/FLU/RSV plus assay is intended as an aid in the diagnosis of influenza from Nasopharyngeal swab specimens and should not be used as a sole basis for treatment. Nasal washings and aspirates are unacceptable for Xpert Xpress SARS-CoV-2/FLU/RSV testing.  Fact Sheet for Patients: BloggerCourse.com  Fact Sheet for Healthcare Providers: SeriousBroker.it  This test is not yet approved or cleared by the [place]  FDA and has been authorized for detection and/or diagnosis of SARS-CoV-2 by FDA under an Emergency Use Authorization (EUA). This EUA will remain in effect (meaning this test can be used) for the duration of the COVID-19 declaration under Section 564(b)(1) of the Act, 21 U.S.C. section 360bbb-3(b)(1), unless the authorization is terminated or revoked.     Resp Syncytial Virus by PCR NEGATIVE NEGATIVE Final    Comment: (NOTE) Fact Sheet for Patients: BloggerCourse.com  Fact Sheet for Healthcare Providers: SeriousBroker.it  This test is not yet approved or cleared by  the [place]  FDA and has been authorized for detection and/or diagnosis of SARS-CoV-2 by FDA under an Emergency Use Authorization (EUA). This EUA will remain in effect (meaning this test can be used) for the duration of the COVID-19 declaration under Section 564(b)(1) of the Act, 21 U.S.C. section 360bbb-3(b)(1), unless the authorization is terminated or revoked.  Performed at [facility], [address], [place], [place] [address]      Radiology Studies: CT ABDOMEN PELVIS W CONTRAST Result Date: 12/16/2023 CLINICAL DATA:  Peritonitis or perforation suspected, history of gastric bypass. Smart ule ulcer perforation status post repair. Upper GI concerning for leak EXAM: CT ABDOMEN AND PELVIS WITH CONTRAST TECHNIQUE: Multidetector CT imaging of the abdomen and pelvis was performed using  the standard protocol following bolus administration of intravenous contrast. RADIATION DOSE REDUCTION: This exam was performed according to the departmental dose-optimization program which includes automated exposure control, adjustment of the mA and/or kV according to patient size and/or use of iterative reconstruction technique. CONTRAST:  OMNIPAQUE  IOHEXOL  300 MG/ML  SOLN COMPARISON:  December 15, 2023 upper GI and December 09, 2023 CT abdomen and pelvis FINDINGS: Lower chest: Mild bibasilar subsegmental atelectasis. Trace left pleural effusion. Hepatobiliary: Liver cysts measuring up to 3.0 cm. No suspicious liver lesions. Gallbladder surgically absent. No biliary ductal dilation. Pancreas: Unremarkable. Spleen: Unremarkable. Adrenals/Urinary Tract: Adrenal glands are unremarkable. No nephrolithiasis or hydronephrosis. Stomach/Bowel: Sequelae of prior gastric bypass and small hiatal hernia. Mild patulous dilation of the small bowel at the small bowel anastomoses likely representing postsurgical anatomy. No definite evidence of bowel obstruction. Oral contrast from prior upper GI seen to the  level of the rectum. No oral contrast extravasation, although evaluation may be limited due to surgical drain in the upper abdomen. Vascular/Lymphatic: Normal caliber aorta. No lymphadenopathy by size criteria. Reproductive: Uterus is surgically absent.  No adnexal masses. Other: Postsurgical changes along the anterior abdominal wall with surgical staples in place. Tiny locules of free air in the along the anterior abdominal wall, likely postsurgical. Trace volume free fluid with few areas of possible partially loculated fluid, particularly in the upper abdomen near the gastric bypass site, measuring 2.8 x 1.4 cm (CT 2: 25). Musculoskeletal: No acute osseous findings. IMPRESSION: 1. Expected postoperative changes following recent perforated marginal ulcer repair and sequelae of prior gastric bypass. Trace volume free fluid with a tiny partially loculated fluid collection adjacent to the stomach. No obvious oral contrast extravasation, although evaluation may be limited by surgical drain in the upper abdomen. Suspected small contained perforation is better seen on prior upper GI fluoroscopy study. 2. Small foci of free air along the anterior abdominal wall, likely postsurgical. Electronically Signed   By: Michaeline Blanch M.D.   On: 12/16/2023 11:59   DG UGI W SINGLE CM (SOL OR THIN BA) Result Date: 12/15/2023 CLINICAL DATA:  63 year old female with history of gastric bypass complicated by perforated ulcer status post surgical repair by Dr. Jordis on 9/16. IR requested for anastomosis integrity check with contrasted study. EXAM: DG UGI W SINGLE CM TECHNIQUE: Single contrast examination was then performed using thin liquid barium. This exam was performed by Carlin Griffon, PA-C, and was supervised and interpreted by Selinda Blue, MD. FLUOROSCOPY: Radiation Exposure Index (as provided by the fluoroscopic device): 46.40 mGy Kerma COMPARISON:  CT abdomen pelvis with contrast media dated 12/09/2023. FINDINGS: Scout radiograph  demonstrates skin staples vertically in the medial right abdomen and cholecystectomy clips in the right upper quadrant. Enteric tube courses into the right abdomen with tip overlying the midline. Dilated small bowel loops in the visualized upper abdomen compatible with postoperative ileus. Patient is status post gastric bypass surgery with gastrojejunal anastomosis. There is a hiatal hernia involving the remnant gastric pouch. The gastrojejunal anastomosis is widely patent and without leak at this site. In the proximal portion of the efferent jejunal limb (approximately 4-5 cm dowstream of the gastrojejunal anastomosis), there is evidence of a small contained perforation with an approximately 2 x 1 cm outpouching of contrast from the left posterior margin of the jejunal lumen, which correlates with the site of the NG tube perforation through the proximal efferent jejunal limb on the recent CT study. There is no associated jejunal stricture. Oral contrast transits to the  mid jejunum. IMPRESSION: Evidence of a small contained perforation in the proximal portion of the efferent jejunal limb (located approximately 4-5 cm downstream of the gastrojejunal anastomosis), correlating with the site of perforation on the recent CT study from 12/09/23. NG tube is well positioned with the tip in the jejunum well beyond the site of perforation. Dilated small bowel loops in the upper abdomen compatible with postoperative ileus. These results will be called to the ordering clinician or representative by the Radiologist Assistant, and communication documented in the PACS or [employer]. Electronically Signed   By: Selinda DELENA Blue M.D.   On: 12/15/2023 15:42     Scheduled Meds:  budesonide -glycopyrrolate -formoterol   2 puff Inhalation BID   Chlorhexidine  Gluconate Cloth  6 each Topical Daily   cyanocobalamin   1,000 mcg Intramuscular Daily   heparin  injection (subcutaneous)  5,000 Units Subcutaneous Q8H   insulin  aspart  0-9  Units Subcutaneous Q4H   montelukast   10 mg Oral QHS   pantoprazole  (PROTONIX ) IV  80 mg Intravenous Q12H   scopolamine   1 patch Transdermal Q72H   sodium chloride  flush  10-40 mL Intracatheter Q12H   Continuous Infusions:  copper  chloride 4 mg in dextrose  5 % 100 mL IVPB Stopped (12/15/23 2014)   piperacillin -tazobactam (ZOSYN )  IV 3.375 g (12/16/23 0920)   promethazine  (PHENERGAN ) injection (IM or IVPB) Stopped (12/09/23 1118)   TPN ADULT (ION) 70 mL/hr at 12/16/23 1254   TPN ADULT (ION)       LOS: 9 days  MDM: Patient is high risk for one or more organ failure.  They necessitate ongoing hospitalization for continued IV therapies and subsequent lab monitoring. Total time spent interpreting labs and vitals, reviewing the medical record, coordinating care amongst consultants and care team members, directly assessing and discussing care with the patient and/or family: 55 min Richard Ritchey, DO [facility]  To contact the attending physician between 7A-7P please use Epic Chat. To contact the covering physician during after hours 7P-7A, please review Amion.  12/16/2023, 3:06 PM   *This document has been created with the assistance of dictation software. Please excuse typographical errors. *

## 2023-12-16 NOTE — Consult Note (Signed)
 PHARMACY - TOTAL PARENTERAL NUTRITION CONSULT NOTE   Indication: Prolonged ileus  Patient Measurements: Height: 5' 1 (154.9 cm) Weight: 79.4 kg (175 lb 0.7 oz) IBW/kg (Calculated) : 47.8 TPN AdjBW (KG): 54.6 Body mass index is 33.07 kg/m. Usual Weight: 75.1 kg  Assessment:  63 yo F with PMH including GERD, extensive abdominal surgical history (including gastric bypass, hysterectomy, cholecystectomy) is presenting with SBO and perforation s/p resection and repair on 9/16.  Glucose / Insulin : no history of diabetes BG 111-145 4u insulin  in past 24 hours Electrolytes: WNL Renal: Scr 0.9>>>0.63>0.74>0.86 Hepatic:  LFTs: AST 61>110, ALT 53>128 TG 177 (reducing Lipids from 35 g/L to 25 g/L, 9/22, will recheck TG 9/24) Intake / Output; MIVF:   Intake/Output Summary (Last 24 hours) at 12/16/2023 0733 Last data filed at 12/16/2023 9561 Gross per 24 hour  Intake 1831 ml  Output 280 ml  Net 1551 ml    GI Imaging: 9/16 CTAP: Persistent high-grade small bowel obstruction, likely due to adhesions, but completely small-bowel obstruction is excluded. Significant dilation of the excluded stomach from enterogastric reflux.  9/22 UGI: Evidence of a small contained perforation in the proximal portion of the efferent jejunal limb (located approximately 4-5 cm downstream of the gastrojejunal anastomosis), correlating with the site of perforation on the recent CT study from 12/09/23. GI Surgeries / Procedures:  9/16 OR: 1. Laparotomy reduction of internal hernia 2. Small bowel resection with primary stapled anastomosis 3. Repair of jejunal ulceration just distal to gatrojejunostomy  Central access: PICC to be placed 9/17 TPN start date: 9/17  Nutritional Goals: Goal TPN rate is 70 mL/hr (provides 104 g of protein and 1865 kcals per day)  RD Assessment: RD consulted, communicated: 1800-2100kcal/day, 90-105g/day protein and 1.5-1.7L/day fluid, she will need thiamine  for 7 days due to  history of gastric bypass  Current Nutrition:  CLD  Plan:  Increase TPN to 70 mL/hr at 1800 Electrolytes in TPN: Na 66mEq/L, K 43mEq/L, Ca 63mEq/L, Mg 74mEq/L, and Phos 15mmol/L. Cl:Ac 1:1 Add standard MVI and trace elements to TPN Initiate Sensitive q4h SSI and adjust as needed Initiate thiamine  100mg  IV daily x 7 days (administering outside TPN) Add zinc  per RD.  Monitor TPN labs on Mon/Thurs Recheck TG and CMP tomorrow  Kayla Mose Niels DOUGLAS, PharmD, BCPS Clinical Pharmacist 12/16/2023 7:33 AM

## 2023-12-16 NOTE — Plan of Care (Signed)
 No c/o pain or nausea  Problem: Education: Goal: Knowledge of General Education information will improve Description: Including pain rating scale, medication(s)/side effects and non-pharmacologic comfort measures Outcome: Progressing   Problem: Health Behavior/Discharge Planning: Goal: Ability to manage health-related needs will improve Outcome: Progressing   Problem: Clinical Measurements: Goal: Ability to maintain clinical measurements within normal limits will improve Outcome: Progressing Goal: Will remain free from infection Outcome: Progressing Goal: Diagnostic test results will improve Outcome: Progressing Goal: Respiratory complications will improve Outcome: Progressing Goal: Cardiovascular complication will be avoided Outcome: Progressing   Problem: Activity: Goal: Risk for activity intolerance will decrease Outcome: Progressing   Problem: Nutrition: Goal: Adequate nutrition will be maintained Outcome: Progressing   Problem: Coping: Goal: Level of anxiety will decrease Outcome: Progressing   Problem: Elimination: Goal: Will not experience complications related to bowel motility Outcome: Progressing Goal: Will not experience complications related to urinary retention Outcome: Progressing   Problem: Pain Managment: Goal: General experience of comfort will improve and/or be controlled Outcome: Progressing   Problem: Safety: Goal: Ability to remain free from injury will improve Outcome: Progressing   Problem: Skin Integrity: Goal: Risk for impaired skin integrity will decrease Outcome: Progressing

## 2023-12-17 DIAGNOSIS — K56609 Unspecified intestinal obstruction, unspecified as to partial versus complete obstruction: Secondary | ICD-10-CM | POA: Diagnosis not present

## 2023-12-17 LAB — CBC WITH DIFFERENTIAL/PLATELET
Abs Immature Granulocytes: 1.11 K/uL — ABNORMAL HIGH (ref 0.00–0.07)
Basophils Absolute: 0.1 K/uL (ref 0.0–0.1)
Basophils Relative: 0 %
Eosinophils Absolute: 0.9 K/uL — ABNORMAL HIGH (ref 0.0–0.5)
Eosinophils Relative: 7 %
HCT: 30 % — ABNORMAL LOW (ref 36.0–46.0)
Hemoglobin: 9.7 g/dL — ABNORMAL LOW (ref 12.0–15.0)
Immature Granulocytes: 8 %
Lymphocytes Relative: 10 %
Lymphs Abs: 1.4 K/uL (ref 0.7–4.0)
MCH: 31.1 pg (ref 26.0–34.0)
MCHC: 32.3 g/dL (ref 30.0–36.0)
MCV: 96.2 fL (ref 80.0–100.0)
Monocytes Absolute: 1.7 K/uL — ABNORMAL HIGH (ref 0.1–1.0)
Monocytes Relative: 13 %
Neutro Abs: 8.2 K/uL — ABNORMAL HIGH (ref 1.7–7.7)
Neutrophils Relative %: 62 %
Platelets: 352 K/uL (ref 150–400)
RBC: 3.12 MIL/uL — ABNORMAL LOW (ref 3.87–5.11)
RDW: 12.8 % (ref 11.5–15.5)
Smear Review: NORMAL
WBC: 13.2 K/uL — ABNORMAL HIGH (ref 4.0–10.5)
nRBC: 0 % (ref 0.0–0.2)

## 2023-12-17 LAB — COMPREHENSIVE METABOLIC PANEL WITH GFR
ALT: 125 U/L — ABNORMAL HIGH (ref 0–44)
AST: 66 U/L — ABNORMAL HIGH (ref 15–41)
Albumin: 2.5 g/dL — ABNORMAL LOW (ref 3.5–5.0)
Alkaline Phosphatase: 72 U/L (ref 38–126)
Anion gap: 8 (ref 5–15)
BUN: 23 mg/dL (ref 8–23)
CO2: 26 mmol/L (ref 22–32)
Calcium: 8.5 mg/dL — ABNORMAL LOW (ref 8.9–10.3)
Chloride: 104 mmol/L (ref 98–111)
Creatinine, Ser: 0.83 mg/dL (ref 0.44–1.00)
GFR, Estimated: >60 mL/min (ref >60)
Glucose, Bld: 124 mg/dL — ABNORMAL HIGH (ref 70–99)
Potassium: 4.5 mmol/L (ref 3.5–5.1)
Sodium: 138 mmol/L (ref 135–145)
Total Bilirubin: 0.4 mg/dL (ref 0.0–1.2)
Total Protein: 5.5 g/dL — ABNORMAL LOW (ref 6.5–8.1)

## 2023-12-17 LAB — TRIGLYCERIDES: Triglycerides: 239 mg/dL — ABNORMAL HIGH (ref <150)

## 2023-12-17 LAB — GLUCOSE, CAPILLARY
Glucose-Capillary: 123 mg/dL — ABNORMAL HIGH (ref 70–99)
Glucose-Capillary: 123 mg/dL — ABNORMAL HIGH (ref 70–99)
Glucose-Capillary: 126 mg/dL — ABNORMAL HIGH (ref 70–99)
Glucose-Capillary: 140 mg/dL — ABNORMAL HIGH (ref 70–99)
Glucose-Capillary: 145 mg/dL — ABNORMAL HIGH (ref 70–99)

## 2023-12-17 LAB — MAGNESIUM: Magnesium: 2.3 mg/dL (ref 1.7–2.4)

## 2023-12-17 LAB — PHOSPHORUS: Phosphorus: 3.4 mg/dL (ref 2.5–4.6)

## 2023-12-17 MED ORDER — ZINC CHLORIDE 1 MG/ML IV SOLN
INTRAVENOUS | Status: AC
  Filled 2023-12-17: qty 694.4

## 2023-12-17 MED ORDER — ACETAMINOPHEN 10 MG/ML IV SOLN
1000.0000 mg | Freq: Four times a day (QID) | INTRAVENOUS | Status: AC
  Administered 2023-12-17 – 2023-12-18 (×4): 1000 mg via INTRAVENOUS
  Filled 2023-12-17 (×4): qty 100

## 2023-12-17 NOTE — Progress Notes (Signed)
 [facility] SURGICAL PROGRESS NOTE  Hospital Day(s): 10.   Post op day(s): 8 Days Post-Op.   Interval History:  Patient seen and examined No acute events or new complaints overnight.  Mainly with low back pain this AM Some gas pains  No significant abdominal pain No fever, chills, nausea, emesis Leukocytosis stable; WBC 13.2K Hgb to 9.7; stable Renal function normal; sCr - 0.83; UO - unmeasured  Surgical drain 30 ccs out; this remains serous  She is NPO + TPN She is having bowel function   Vital signs in last 24 hours: [min-max] current  Temp:  [98.1 F (36.7 C)-98.8 F (37.1 C)] 98.1 F (36.7 C) (09/24 0346) Pulse Rate:  [79-85] 79 (09/24 0346) Resp:  [15-18] 18 (09/24 0346) BP: (109-120)/(54-71) 120/54 (09/24 0346) SpO2:  [96 %-100 %] 96 % (09/24 0346) Weight:  [80.2 kg] 80.2 kg (09/24 0346)     Height: 5' 1 (154.9 cm) Weight: 80.2 kg BMI (Calculated): 33.43   Intake/Output last 2 shifts:  09/23 0701 - 09/24 0700 In: 1910.5 [I.V.:1675.8; IV Piggyback:234.8] Out: 30 [Drains:30]   Physical Exam:  Constitutional: alert, cooperative and no distress  Respiratory: breathing non-labored at rest  Cardiovascular: regular rate and sinus rhythm  Gastrointestinal: Soft, incisional soreness; distension improving, no rebound/guarding. Surgical drain in right abdomen; output serous  Integumentary: Laparotomy is CDI with staples, no erythema   Labs:     Latest Ref Rng & Units 12/17/2023    5:00 AM 12/16/2023    4:16 AM 12/15/2023    5:07 AM  CBC  WBC 4.0 - 10.5 K/uL 13.2  13.1  15.6   Hemoglobin 12.0 - 15.0 g/dL 9.7  9.6  89.7   Hematocrit 36.0 - 46.0 % 30.0  30.0  30.9   Platelets 150 - 400 K/uL 352  305  290       Latest Ref Rng & Units 12/17/2023    5:00 AM 12/16/2023    4:16 AM 12/15/2023    5:07 AM  CMP  Glucose 70 - 99 mg/dL 875  854  873   BUN 8 - 23 mg/dL 23  20  18    Creatinine 0.44 - 1.00 mg/dL 9.16  9.13  9.25   Sodium 135 - 145 mmol/L 138   137  134   Potassium 3.5 - 5.1 mmol/L 4.5  4.3  4.0   Chloride 98 - 111 mmol/L 104  105  101   CO2 22 - 32 mmol/L 26  25  26    Calcium 8.9 - 10.3 mg/dL 8.5  8.5  8.4   Total Protein 6.5 - 8.1 g/dL 5.5  5.4  5.7   Total Bilirubin 0.0 - 1.2 mg/dL 0.4  0.5  0.5   Alkaline Phos 38 - 126 U/L 72  69  52   AST 15 - 41 U/L 66  110  61   ALT 0 - 44 U/L 125  128  53     Imaging studies:  No new imaging studies   Assessment/Plan:  63 y.o. female 8 Days Post-Op s/p laparotomy, small bowel resection, and repair of jejunal ulceration just distal to gatrojejunostomy    - CT Abdomen/Pelvis with PO contrast without evidence of leak. We will plan to repeat UGI tomorrow (09/25) to reassess. If there continues to be contrast extravasation, she likely need Tx to bariatric center for evaluation/management.   - Considered replacement of NGT under fluoroscopy; however, there still remains risk of placing this through perforation even  under fluor guidance. Will hold off on this for now.   - She needs to remain strict NPO today.   - Continue to TPN for now; Still unclear when we will be able to resume PO intake and she is now without enteral feeding option  - Monitor leukocytosis; stable  - Continue IV Abx (Zosyn ); Cx from OR without growth - Please do not stop yet given concern for perforation on UGI  - Continue surgical drain; monitor and record output closely  - Continue PPI; Pantoprazole  80 mg IV BID - Monitor abdominal examination; on-going bowel function - Pain control prn; antiemetics prn  - Mobilize; has done well with this  - Further management per primary service; we will follow    All of the above findings and recommendations were discussed with the patient, and the medical team, and all of patient's questions were answered to her expressed satisfaction.  -- Arthea Platt, PA-C [facility] 12/17/2023, 7:27 AM M-F: 7am - 4pm

## 2023-12-17 NOTE — Consult Note (Signed)
 PHARMACY - TOTAL PARENTERAL NUTRITION CONSULT NOTE   Indication: Prolonged ileus  Patient Measurements: Height: 5' 1 (154.9 cm) Weight: 80.2 kg (176 lb 12.9 oz) IBW/kg (Calculated) : 47.8 TPN AdjBW (KG): 54.6 Body mass index is 33.41 kg/m. Usual Weight: 75.1 kg  Assessment:  63 yo F with PMH including GERD, extensive abdominal surgical history (including gastric bypass, hysterectomy, cholecystectomy) is presenting with SBO and perforation s/p resection and repair on 9/16.  Glucose / Insulin : no history of diabetes BG 109-140 3u insulin  in past 24 hours Electrolytes: WNL Renal: Scr 0.9>>>0.86>0.83 Hepatic:  LFTs: AST 61>110>66, ALT 53>128>125 TG 177>239 (reducing Lipids from 25 g/L to 22 g/L) Intake / Output; MIVF:   Intake/Output Summary (Last 24 hours) at 12/17/2023 0751 Last data filed at 12/17/2023 0441 Gross per 24 hour  Intake 1910.53 ml  Output 30 ml  Net 1880.53 ml    GI Imaging: 9/16 CTAP: Persistent high-grade small bowel obstruction, likely due to adhesions, but completely small-bowel obstruction is excluded. Significant dilation of the excluded stomach from enterogastric reflux.  9/22 UGI: Evidence of a small contained perforation in the proximal portion of the efferent jejunal limb (located approximately 4-5 cm downstream of the gastrojejunal anastomosis), correlating with the site of perforation on the recent CT study from 12/09/23. GI Surgeries / Procedures:  9/16 OR: 1. Laparotomy reduction of internal hernia 2. Small bowel resection with primary stapled anastomosis 3. Repair of jejunal ulceration just distal to gatrojejunostomy  Central access: PICC to be placed 9/17 TPN start date: 9/17  Nutritional Goals: Goal TPN rate is 70 mL/hr (provides 104 g of protein and 1815 kcals per day)  RD Assessment: RD consulted, communicated: 1800-2100kcal/day, 90-105g/day protein and 1.5-1.7L/day fluid, she will need thiamine  for 7 days due to history of gastric  bypass  Current Nutrition:  NPO  Plan:  Increase TPN to 70 mL/hr at 1800 Electrolytes in TPN: Na 61mEq/L, K 69mEq/L (reduced 9/24), Ca 60mEq/L, Mg 91mEq/L (reduced 9/24), and Phos 73mmol/L. Cl:Ac 1:1 Add standard MVI and trace elements to TPN Initiate Sensitive q4h SSI and adjust as needed Initiate thiamine  100mg  IV daily x 7 days (administering outside TPN)>>completed Add zinc  per RD.  Monitor TPN labs on Mon/Thurs   Kayla Mose Niels DOUGLAS, PharmD, BCPS Clinical Pharmacist 12/17/2023 7:51 AM

## 2023-12-17 NOTE — Plan of Care (Signed)

## 2023-12-17 NOTE — Progress Notes (Addendum)
 Progress Note    Sandra Chung  FMW:[id number] DOB: December 09, 1960  DOA: 12/07/2023 PCP: Jyl Railing, MD      Brief Narrative:    Medical records reviewed and are as summarized below:  Sandra Chung is a 63 y.o. female with past medical history significant for GERD, COPD, multiple prior abdominal surgeries including gastric bypass, hysterectomy, cholecystectomy, with entered to the hospital with nausea, vomiting and abdominal pain of about 2 days duration.  She was found to have small bowel obstruction.  She was initially managed conservatively with NG tube. She was having bowel movements as well as passing a Gastrografin  challenge however KUB continue to reveal obstructive pattern and she had significant pain.  Repeat CT abdomen pelvis 9/16 revealed NG tube appeared to have perforated anterior wall of jejunum.  Patient went for urgent exploratory laparotomy on 9/16.  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  Postoperatively patient was kept with NG tube until 9/22 at which time NG tube was removed.  Upper GI then revealed small perforation.   General surgeon recommended strict n.p.o. and artificial nutrition with TPN.        Assessment/Plan:   Principal Problem:   Small bowel obstruction (HCC) Active Problems:   Perforated ulcer of intestine (HCC)   Nutrition Problem: Inadequate oral intake Etiology: altered GI function  Signs/Symptoms: NPO status   Body mass index is 33.41 kg/m.  (class I obesity)    Small bowel obstruction with perforation, leukocytosis: - Ex lap 9/16: Laparotomy reduction of internal hernia, small bowel resection with primary stapled anastomosis, repair of jejunal ulceration just distal to gastrojejunostomy - NG tube was placed intraoperatively past the area of perforation.  Was removed on 9/22 - 9/22 upper GI shows  area of small perforation Repeat CT abdomen and pelvis on 12/16/2023 did not show contrast extravasation. She remains NPO and is on TPN for nutrition.  Analgesics as needed for pain. Continue IV antibiotics. Plan for repeat CT abdomen pelvis tomorrow.   Elevated liver enzymes: Etiology unclear.  Continue to monitor.  CT abdomen showed liver cysts.   COPD: Stable.  Bronchodilators as needed.   Diet Order             Diet NPO time specified  Diet effective now                                  Consultants: General Surgeon  Procedures: 1. Laparotomy reduction of internal hernia on 12/09/2023 2. Small bowel resection with primary stapled anastomosis 3. Repair of jejunal ulceration just distal to gatrojejunostomy    Medications:    budesonide -glycopyrrolate -formoterol   2 puff Inhalation BID   Chlorhexidine  Gluconate Cloth  6 each Topical Daily   cyanocobalamin   1,000 mcg Intramuscular Daily   heparin  injection (subcutaneous)  5,000 Units Subcutaneous Q8H   insulin  aspart  0-9 Units Subcutaneous Q4H   montelukast   10 mg Oral QHS   pantoprazole  (PROTONIX ) IV  80 mg Intravenous Q12H   scopolamine   1 patch Transdermal Q72H   sodium chloride  flush  10-40 mL Intracatheter Q12H   Continuous Infusions:  copper  chloride 4 mg in dextrose  5 % 100 mL IVPB Stopped (12/16/23 1741)   piperacillin -tazobactam (ZOSYN )  IV 12.5 mL/hr at 12/17/23 0441   promethazine  (PHENERGAN ) injection (IM or IVPB) Stopped (12/09/23 1118)   TPN  ADULT (ION) 70 mL/hr at 12/17/23 0441   TPN ADULT (ION)       Anti-infectives (From admission, onward)    Start     Dose/Rate Route Frequency Ordered Stop   12/09/23 2300  piperacillin -tazobactam (ZOSYN ) IVPB 3.375 g        3.375 g 12.5 mL/hr over 240 Minutes Intravenous Every 8 hours 12/09/23 1641     12/09/23 1445  piperacillin -tazobactam (ZOSYN ) IVPB 3.375 g        3.375 g 100 mL/hr over 30 Minutes Intravenous  Once 12/09/23 1440  12/09/23 1350   12/09/23 1330  cefoTEtan  (CEFOTAN ) 2 g in sodium chloride  0.9 % 100 mL IVPB        2 g 200 mL/hr over 30 Minutes Intravenous  Once 12/09/23 1316 12/09/23 1524              Family Communication/Anticipated D/C date and plan/Code Status   DVT prophylaxis: heparin  injection 5,000 Units Start: 12/10/23 2200 SCDs Start: 12/07/23 1139     Code Status: Full Code  Family Communication: None Disposition Plan: Plan to discharge home   Status is: Inpatient Remains inpatient appropriate because: Laparotomy       Subjective:   Interval events noted.  She complains of nausea and gas pain.  Tully, RN, was at the bedside.  Objective:    Vitals:   12/16/23 1430 12/16/23 2030 12/17/23 0346 12/17/23 0738  BP: 109/67 (!) 111/59 (!) 120/54 104/64  Pulse: 84 85 79 84  Resp: 15 18 18 18   Temp: 98.7 F (37.1 C) 98.8 F (37.1 C) 98.1 F (36.7 C) 98.5 F (36.9 C)  TempSrc: Oral Oral Oral Oral  SpO2: 97% 98% 96% 97%  Weight:   80.2 kg   Height:       No data found.   Intake/Output Summary (Last 24 hours) at 12/17/2023 1127 Last data filed at 12/17/2023 0441 Gross per 24 hour  Intake 1910.53 ml  Output 30 ml  Net 1880.53 ml   Filed Weights   12/15/23 0447 12/16/23 0313 12/17/23 0346  Weight: 81.2 kg 79.4 kg 80.2 kg    Exam:  GEN: NAD SKIN: Warm and dry EYES: No pallor or icterus ENT: MMM CV: RRR PULM: CTA B ABD: soft, ND, mild surgical tenderness, +BS, surgical incision is clean, dry with intact staples. CNS: AAO x 3, non focal EXT: No edema or tenderness        Data Reviewed:   I have personally reviewed following labs and imaging studies:  Labs: Labs show the following:   Basic Metabolic Panel: Recent Labs  Lab 12/12/23 0559 12/13/23 1240 12/14/23 0353 12/15/23 0507 12/16/23 0416 12/17/23 0500  NA 140 141 138 134* 137 138  K 3.4* 4.4 3.9 4.0 4.3 4.5  CL 104 108 104 101 105 104  CO2 28 25 27 26 25 26   GLUCOSE 110* 105*  129* 126* 145* 124*  BUN 20 18 16 18 20 23   CREATININE 0.75 0.58 0.63 0.74 0.86 0.83  CALCIUM 8.5* 8.9 8.7* 8.4* 8.5* 8.5*  MG 2.2  --  2.0 2.0 2.1 2.3  PHOS 2.0*  --  2.8 3.4 3.3 3.4   GFR Estimated Creatinine Clearance: 65.7 mL/min (by C-G formula based on SCr of 0.83 mg/dL). Liver Function Tests: Recent Labs  Lab 12/13/23 1240 12/14/23 0353 12/15/23 0507 12/16/23 0416 12/17/23 0500  AST 43* 24 61* 110* 66*  ALT 34 29 53* 128* 125*  ALKPHOS 49 47 52 69 72  BILITOT 0.8 0.4 0.5 0.5 0.4  PROT 5.8* 5.7* 5.7* 5.4* 5.5*  ALBUMIN  2.7* 2.6* 2.5* 2.4* 2.5*   No results for input(s): LIPASE, AMYLASE in the last 168 hours. No results for input(s): AMMONIA in the last 168 hours. Coagulation profile No results for input(s): INR, PROTIME in the last 168 hours.  CBC: Recent Labs  Lab 12/13/23 1611 12/14/23 0353 12/15/23 0507 12/16/23 0416 12/17/23 0500  WBC 8.2 11.6* 15.6* 13.1* 13.2*  NEUTROABS 5.6 8.0* 10.3* 8.3* 8.2*  HGB 9.9* 9.9* 10.2* 9.6* 9.7*  HCT 29.6* 30.6* 30.9* 30.0* 30.0*  MCV 93.4 95.0 94.8 95.8 96.2  PLT 227 251 290 305 352   Cardiac Enzymes: No results for input(s): CKTOTAL, CKMB, CKMBINDEX, TROPONINI in the last 168 hours. BNP (last 3 results) No results for input(s): PROBNP in the last 8760 hours. CBG: Recent Labs  Lab 12/16/23 1533 12/16/23 2018 12/16/23 2318 12/17/23 0344 12/17/23 0757  GLUCAP 122* 120* 133* 140* 126*   D-Dimer: No results for input(s): DDIMER in the last 72 hours. Hgb A1c: No results for input(s): HGBA1C in the last 72 hours. Lipid Profile: Recent Labs    12/15/23 0507 12/17/23 0521  TRIG 177* 239*   Thyroid function studies: No results for input(s): TSH, T4TOTAL, T3FREE, THYROIDAB in the last 72 hours.  Invalid input(s): FREET3 Anemia work up: No results for input(s): VITAMINB12, FOLATE, FERRITIN, TIBC, IRON, RETICCTPCT in the last 72 hours. Sepsis Labs: Recent Labs   Lab 12/14/23 0353 12/15/23 0507 12/16/23 0416 12/17/23 0500  WBC 11.6* 15.6* 13.1* 13.2*    Microbiology Recent Results (from the past 240 hours)  Aerobic/Anaerobic Culture w Gram Stain (surgical/deep wound)     Status: None   Collection Time: 12/09/23  2:28 PM   Specimen: Path fluid; Body Fluid  Result Value Ref Range Status   Specimen Description   Final    WOUND Performed at [facility], [address] [place], [place] [address]    Special Requests   Final    INTRA ABDOMINAL FLUID Performed at [facility], [address] [place], [place] [address]    Gram Stain   Final    FEW WBC PRESENT, PREDOMINANTLY PMN NO ORGANISMS SEEN    Culture   Final    No growth aerobically or anaerobically. Performed at [facility], [address] [place], [place] [address]    Report Status 12/14/2023 FINAL  Final  Resp panel by RT-PCR (RSV, Flu A&B, Covid) Anterior Nasal Swab     Status: None   Collection Time: 12/15/23  7:33 PM   Specimen: Anterior Nasal Swab  Result Value Ref Range Status   SARS Coronavirus 2 by RT PCR NEGATIVE NEGATIVE Final    Comment: (NOTE) SARS-CoV-2 target nucleic acids are NOT DETECTED.  The SARS-CoV-2 RNA is generally detectable in upper respiratory specimens during the acute phase of infection. The lowest concentration of SARS-CoV-2 viral copies this assay can detect is 138 copies/mL. A negative result does not preclude SARS-Cov-2 infection and should not be used as the sole basis for treatment or other patient management decisions. A negative result may occur with  improper specimen collection/handling, submission of specimen other than nasopharyngeal swab, presence of viral mutation(s) within the areas targeted by this assay, and inadequate number of viral copies(<138 copies/mL). A negative result must be combined with clinical observations, patient history, and epidemiological information. The expected result is  Negative.  Fact Sheet for Patients:  BloggerCourse.com  Fact  Sheet for Healthcare Providers:  SeriousBroker.it  This test is no t yet approved or cleared by the [place]  FDA and  has been authorized for detection and/or diagnosis of SARS-CoV-2 by FDA under an Emergency Use Authorization (EUA). This EUA will remain  in effect (meaning this test can be used) for the duration of the COVID-19 declaration under Section 564(b)(1) of the Act, 21 U.S.C.section 360bbb-3(b)(1), unless the authorization is terminated  or revoked sooner.       Influenza A by PCR NEGATIVE NEGATIVE Final   Influenza B by PCR NEGATIVE NEGATIVE Final    Comment: (NOTE) The Xpert Xpress SARS-CoV-2/FLU/RSV plus assay is intended as an aid in the diagnosis of influenza from Nasopharyngeal swab specimens and should not be used as a sole basis for treatment. Nasal washings and aspirates are unacceptable for Xpert Xpress SARS-CoV-2/FLU/RSV testing.  Fact Sheet for Patients: BloggerCourse.com  Fact Sheet for Healthcare Providers: SeriousBroker.it  This test is not yet approved or cleared by the [place]  FDA and has been authorized for detection and/or diagnosis of SARS-CoV-2 by FDA under an Emergency Use Authorization (EUA). This EUA will remain in effect (meaning this test can be used) for the duration of the COVID-19 declaration under Section 564(b)(1) of the Act, 21 U.S.C. section 360bbb-3(b)(1), unless the authorization is terminated or revoked.     Resp Syncytial Virus by PCR NEGATIVE NEGATIVE Final    Comment: (NOTE) Fact Sheet for Patients: BloggerCourse.com  Fact Sheet for Healthcare Providers: SeriousBroker.it  This test is not yet approved or cleared by the [place]  FDA and has been authorized for detection and/or diagnosis of  SARS-CoV-2 by FDA under an Emergency Use Authorization (EUA). This EUA will remain in effect (meaning this test can be used) for the duration of the COVID-19 declaration under Section 564(b)(1) of the Act, 21 U.S.C. section 360bbb-3(b)(1), unless the authorization is terminated or revoked.  Performed at [facility], [address] [place], [place] [address]     Procedures and diagnostic studies:  CT ABDOMEN PELVIS W CONTRAST Result Date: 12/16/2023 CLINICAL DATA:  Peritonitis or perforation suspected, history of gastric bypass. Smart ule ulcer perforation status post repair. Upper GI concerning for leak EXAM: CT ABDOMEN AND PELVIS WITH CONTRAST TECHNIQUE: Multidetector CT imaging of the abdomen and pelvis was performed using the standard protocol following bolus administration of intravenous contrast. RADIATION DOSE REDUCTION: This exam was performed according to the departmental dose-optimization program which includes automated exposure control, adjustment of the mA and/or kV according to patient size and/or use of iterative reconstruction technique. CONTRAST:  OMNIPAQUE  IOHEXOL  300 MG/ML  SOLN COMPARISON:  December 15, 2023 upper GI and December 09, 2023 CT abdomen and pelvis FINDINGS: Lower chest: Mild bibasilar subsegmental atelectasis. Trace left pleural effusion. Hepatobiliary: Liver cysts measuring up to 3.0 cm. No suspicious liver lesions. Gallbladder surgically absent. No biliary ductal dilation. Pancreas: Unremarkable. Spleen: Unremarkable. Adrenals/Urinary Tract: Adrenal glands are unremarkable. No nephrolithiasis or hydronephrosis. Stomach/Bowel: Sequelae of prior gastric bypass and small hiatal hernia. Mild patulous dilation of the small bowel at the small bowel anastomoses likely representing postsurgical anatomy. No definite evidence of bowel obstruction. Oral contrast from prior upper GI seen to the level of the rectum. No oral contrast extravasation, although  evaluation may be limited due to surgical drain in the upper abdomen. Vascular/Lymphatic: Normal caliber aorta. No lymphadenopathy by size criteria. Reproductive: Uterus is surgically absent.  No adnexal masses. Other: Postsurgical changes along the anterior abdominal wall with surgical  staples in place. Tiny locules of free air in the along the anterior abdominal wall, likely postsurgical. Trace volume free fluid with few areas of possible partially loculated fluid, particularly in the upper abdomen near the gastric bypass site, measuring 2.8 x 1.4 cm (CT 2: 25). Musculoskeletal: No acute osseous findings. IMPRESSION: 1. Expected postoperative changes following recent perforated marginal ulcer repair and sequelae of prior gastric bypass. Trace volume free fluid with a tiny partially loculated fluid collection adjacent to the stomach. No obvious oral contrast extravasation, although evaluation may be limited by surgical drain in the upper abdomen. Suspected small contained perforation is better seen on prior upper GI fluoroscopy study. 2. Small foci of free air along the anterior abdominal wall, likely postsurgical. Electronically Signed   By: Michaeline Blanch M.D.   On: 12/16/2023 11:59               LOS: 10 days   Krissie Merrick  [facility]   Pager on [email or web address]. If 7PM-7AM, please contact night-coverage at [email or web address]     12/17/2023, 11:27 AM

## 2023-12-17 NOTE — TOC Progression Note (Signed)
 Transition of Care [facility]) - Progression Note    Patient Details  Name: UNDREA ARCHBOLD MRN: [id number] Date of Birth: March 04, 1961  Transition of Care [facility] CM/SW Contact  Corean ONEIDA Haddock, RN Phone Number: 12/17/2023, 3:42 PM  Clinical Narrative:     Met with patient at bedside.  She confirms she still declines HH at this time.  She also declines [facility]  Expected Discharge Plan: Home/Self Care Barriers to Discharge: Continued Medical Work up               Expected Discharge Plan and Services     Post Acute Care Choice: Durable Medical Equipment Living arrangements for the past 2 months: Single Family Home                                       Social Drivers of Health (SDOH) Interventions SDOH Screenings   Food Insecurity: No Food Insecurity (12/07/2023)  Recent Concern: Food Insecurity - Food Insecurity Present (11/11/2023)   Received from [facility]  Housing: Low Risk  (12/07/2023)  Transportation Needs: No Transportation Needs (12/07/2023)  Utilities: Not At Risk (12/07/2023)  Recent Concern: Utilities - At Risk (11/11/2023)   Received from [facility]  Financial Resource Strain: Medium Risk (11/11/2023)   Received from [facility]  Tobacco Use: Low Risk  (12/07/2023)    Readmission Risk Interventions     No data to display

## 2023-12-17 NOTE — Progress Notes (Signed)
 Nutrition Follow-up  DOCUMENTATION CODES:   Obesity unspecified  INTERVENTION:   -RD will follow for diet advancement and/or ability to transition to enteral nutrition -TPN management per pharmacy -Continue daily weights -Continue Copper  chloride 4mg  IV daily x 7 days, followed by copper  8mg  po daily x 30 days  -Continue Cyanocobalamin  1,089mcg IM daily for 7 days  -Continue Zinc  5mg  daily added to TPN -Ergocalciferol  50,000 units po weekly x 6 weeks with diet advancement  -Zinc  220mg  po daily x 30 days once TPN discontinued- Give with food NUTRITION DIAGNOSIS:   Inadequate oral intake related to altered GI function as evidenced by NPO status.  Ongoing  GOAL:   Patient will meet greater than or equal to 90% of their needs  Met with TPN  MONITOR:   Diet advancement, Labs, Weight trends, Skin, I & O's, Other (Comment) (TPN)  REASON FOR ASSESSMENT:   Consult New TPN/TNA  ASSESSMENT:   63 y/o female with h/o asthma, COPD, OSA, depression, GERD, HTN, HLD, remote cocaine use, hiatal hernia, chronic pain, SIBO, HCV, gonorrhea, gastric ulcer secondary to H. pylori, rectocele repair s/p bladder sling, s/p hysterectomy, s/p cholecystectomy, morbid obesity s/p roux-en-y gastric bypass (2010), s/p diagnostic laparoscopy with LOA and tightening of mesenteric defects (2012), lymphedema, TIA and internal hernia who is admitted with peritonitis, SBO secondary to internal hernia & adhesions and perforated ulcer now s/p laparotomy with LOA, reduction of internal hernia, small bowel resection (~10cm) with primary stapled anastomosis & repair of jejunal ulceration (located just distal to gatrojejunostomy) 9/16.  9/22- NGT d/c, advanced to clear liquid diet, upper GI shows area of small perforation, back to NPO  Reviewed I/O's: +1.9 L x 24 hours and +11 L since admission  Drain out: 30 ml x 24 hours  Pt out of room at time of visit.   Per general surgery notes, plan for repeat contrast  study on Thursday. If perforation is still evident, will require transfer for advanced endoscopy and potential endoscopic repair versus revision .   Pt remains NPO and receiving TPN for nutritional support at 70 ml/hr, which provides 1815 kcals and 104 grams protein, which meets 100% of estimated kcal and protein needs.  No wt loss since admission.   Per TOC notes, plan to discharge home once medically stable.   Medications reviewed and include vitamin B-12, protonix , and copper .   Labs reviewed: CBGS: 109-140 (inpatient orders for glycemic control are 0-9 units insulin  aspart every 4 hours). Vitamin A  and E results still pending.  Diet Order:   Diet Order             Diet NPO time specified  Diet effective now                   EDUCATION NEEDS:   Education needs have been addressed  Skin:  Skin Assessment: Skin Integrity Issues: Skin Integrity Issues:: Incisions Incisions: closed abdomen  Last BM:  12/17/23  Height:   Ht Readings from Last 1 Encounters:  12/07/23 5' 1 (1.549 m)    Weight:   Wt Readings from Last 1 Encounters:  12/17/23 80.2 kg    Ideal Body Weight:  47.7 kg  BMI:  Body mass index is 33.41 kg/m.  Estimated Nutritional Needs:   Kcal:  1800-2100kcal/day  Protein:  90-105g/day  Fluid:  1.5-1.7L/day    Margery ORN, RD, LDN, CDCES Registered Dietitian III Certified Diabetes Care and Education Specialist If unable to reach this RD, please use RD Inpatient group  chat on secure chat between hours of 8am-4 pm daily

## 2023-12-18 ENCOUNTER — Inpatient Hospital Stay

## 2023-12-18 DIAGNOSIS — K56609 Unspecified intestinal obstruction, unspecified as to partial versus complete obstruction: Secondary | ICD-10-CM | POA: Diagnosis not present

## 2023-12-18 LAB — CBC WITH DIFFERENTIAL/PLATELET
Abs Immature Granulocytes: 0.91 K/uL — ABNORMAL HIGH (ref 0.00–0.07)
Basophils Absolute: 0.1 K/uL (ref 0.0–0.1)
Basophils Relative: 0 %
Eosinophils Absolute: 0.7 K/uL — ABNORMAL HIGH (ref 0.0–0.5)
Eosinophils Relative: 4 %
HCT: 29.3 % — ABNORMAL LOW (ref 36.0–46.0)
Hemoglobin: 9.5 g/dL — ABNORMAL LOW (ref 12.0–15.0)
Immature Granulocytes: 6 %
Lymphocytes Relative: 9 %
Lymphs Abs: 1.4 K/uL (ref 0.7–4.0)
MCH: 30.9 pg (ref 26.0–34.0)
MCHC: 32.4 g/dL (ref 30.0–36.0)
MCV: 95.4 fL (ref 80.0–100.0)
Monocytes Absolute: 1.7 K/uL — ABNORMAL HIGH (ref 0.1–1.0)
Monocytes Relative: 11 %
Neutro Abs: 11 K/uL — ABNORMAL HIGH (ref 1.7–7.7)
Neutrophils Relative %: 70 %
Platelets: 367 K/uL (ref 150–400)
RBC: 3.07 MIL/uL — ABNORMAL LOW (ref 3.87–5.11)
RDW: 12.8 % (ref 11.5–15.5)
Smear Review: NORMAL
WBC: 15.8 K/uL — ABNORMAL HIGH (ref 4.0–10.5)
nRBC: 0 % (ref 0.0–0.2)

## 2023-12-18 LAB — COMPREHENSIVE METABOLIC PANEL WITH GFR
ALT: 96 U/L — ABNORMAL HIGH (ref 0–44)
AST: 39 U/L (ref 15–41)
Albumin: 2.5 g/dL — ABNORMAL LOW (ref 3.5–5.0)
Alkaline Phosphatase: 87 U/L (ref 38–126)
Anion gap: 7 (ref 5–15)
BUN: 24 mg/dL — ABNORMAL HIGH (ref 8–23)
CO2: 26 mmol/L (ref 22–32)
Calcium: 8.6 mg/dL — ABNORMAL LOW (ref 8.9–10.3)
Chloride: 103 mmol/L (ref 98–111)
Creatinine, Ser: 0.85 mg/dL (ref 0.44–1.00)
GFR, Estimated: >60 mL/min (ref >60)
Glucose, Bld: 132 mg/dL — ABNORMAL HIGH (ref 70–99)
Potassium: 4.3 mmol/L (ref 3.5–5.1)
Sodium: 136 mmol/L (ref 135–145)
Total Bilirubin: 0.5 mg/dL (ref 0.0–1.2)
Total Protein: 5.8 g/dL — ABNORMAL LOW (ref 6.5–8.1)

## 2023-12-18 LAB — PHOSPHORUS: Phosphorus: 3.7 mg/dL (ref 2.5–4.6)

## 2023-12-18 LAB — GLUCOSE, CAPILLARY
Glucose-Capillary: 111 mg/dL — ABNORMAL HIGH (ref 70–99)
Glucose-Capillary: 114 mg/dL — ABNORMAL HIGH (ref 70–99)
Glucose-Capillary: 126 mg/dL — ABNORMAL HIGH (ref 70–99)
Glucose-Capillary: 136 mg/dL — ABNORMAL HIGH (ref 70–99)
Glucose-Capillary: 147 mg/dL — ABNORMAL HIGH (ref 70–99)

## 2023-12-18 LAB — VITAMIN A: Vitamin A (Retinoic Acid): 7.2 ug/dL — ABNORMAL LOW (ref 22.0–69.5)

## 2023-12-18 LAB — MAGNESIUM: Magnesium: 2.2 mg/dL (ref 1.7–2.4)

## 2023-12-18 LAB — VITAMIN E
Vitamin E (Alpha Tocopherol): 6.2 mg/L — ABNORMAL LOW (ref 9.0–29.0)
Vitamin E(Gamma Tocopherol): 0.7 mg/L (ref 0.5–4.9)

## 2023-12-18 LAB — TRIGLYCERIDES: Triglycerides: 131 mg/dL (ref <150)

## 2023-12-18 MED ORDER — ACETAMINOPHEN 500 MG PO TABS
1000.0000 mg | ORAL_TABLET | Freq: Four times a day (QID) | ORAL | Status: DC
  Administered 2023-12-18 – 2023-12-20 (×5): 1000 mg via ORAL
  Filled 2023-12-18 (×7): qty 2

## 2023-12-18 MED ORDER — INSULIN ASPART 100 UNIT/ML IJ SOLN
0.0000 [IU] | Freq: Four times a day (QID) | INTRAMUSCULAR | Status: DC
  Administered 2023-12-18 – 2023-12-20 (×5): 1 [IU] via SUBCUTANEOUS
  Filled 2023-12-18 (×5): qty 1

## 2023-12-18 MED ORDER — OXYCODONE HCL 5 MG PO TABS: 5.0000 mg | ORAL_TABLET | ORAL | Status: DC | PRN

## 2023-12-18 MED ORDER — ZINC CHLORIDE 1 MG/ML IV SOLN
INTRAVENOUS | Status: AC
  Filled 2023-12-18: qty 694.4

## 2023-12-18 NOTE — Consult Note (Signed)
 PHARMACY - TOTAL PARENTERAL NUTRITION CONSULT NOTE   Indication: Prolonged ileus  Patient Measurements: Height: 5' 1 (154.9 cm) Weight: 74.9 kg (165 lb 2 oz) IBW/kg (Calculated) : 47.8 TPN AdjBW (KG): 54.6 Body mass index is 31.2 kg/m. Usual Weight: 75.1 kg  Assessment:  63 yo F with PMH including GERD, extensive abdominal surgical history (including gastric bypass, hysterectomy, cholecystectomy) is presenting with SBO and perforation s/p resection and repair on 9/16.  Glucose / Insulin : no history of diabetes BG 123-145 5u insulin  in past 24 hours Electrolytes: WNL Renal: Scr 0.9>>>0.86>0.83>0.85 Hepatic:  LFTs: AST 61>110>66>39, ALT 53>128>125>96 TG 177>239>131 (reducing Lipids from 25 g/L to 22 g/L 9/24) Intake / Output; MIVF:   Intake/Output Summary (Last 24 hours) at 12/18/2023 0718 Last data filed at 12/18/2023 0319 Gross per 24 hour  Intake 1993.26 ml  Output --  Net 1993.26 ml    GI Imaging: 9/16 CTAP: Persistent high-grade small bowel obstruction, likely due to adhesions, but completely small-bowel obstruction is excluded. Significant dilation of the excluded stomach from enterogastric reflux.  9/22 UGI: Evidence of a small contained perforation in the proximal portion of the efferent jejunal limb (located approximately 4-5 cm downstream of the gastrojejunal anastomosis), correlating with the site of perforation on the recent CT study from 12/09/23. GI Surgeries / Procedures:  9/16 OR: 1. Laparotomy reduction of internal hernia 2. Small bowel resection with primary stapled anastomosis 3. Repair of jejunal ulceration just distal to gatrojejunostomy  Central access: PICC to be placed 9/17 TPN start date: 9/17  Nutritional Goals: Goal TPN rate is 70 mL/hr (provides 104 g of protein and 1815 kcals per day)  RD Assessment: RD consulted, communicated: 1800-2100kcal/day, 90-105g/day protein and 1.5-1.7L/day fluid, she will need thiamine  for 7 days due to history  of gastric bypass  Current Nutrition:  NPO  Plan:  Continue TPN at 70 mL/hr at 1800 Electrolytes in TPN: Na 50mEq/L, K 63mEq/L (reduced 9/24), Ca 73mEq/L, Mg 38mEq/L (reduced 9/24), and Phos 21mmol/L. Cl:Ac 1:1 Add standard MVI and trace elements to TPN Initiate Sensitive q6h SSI and adjust as needed Initiate thiamine  100mg  IV daily x 7 days (administering outside TPN)>>completed Add zinc  per RD.  Monitor TPN labs on Mon/Thurs   Kayla Mose Niels DOUGLAS, PharmD, BCPS Clinical Pharmacist 12/18/2023 7:18 AM

## 2023-12-18 NOTE — Plan of Care (Signed)

## 2023-12-18 NOTE — Progress Notes (Signed)
 [facility] SURGICAL PROGRESS NOTE  Hospital Day(s): 11.   Post op day(s): 9 Days Post-Op.   Interval History:  Patient seen and examined No acute events or new complaints overnight.  She is feeling better; gas pains have resolved No significant abdominal pain No fever, chills, nausea, emesis Leukocytosis back to 15.8K Hgb to 9.5; stable Renal function normal; sCr - 0.85; UO - unmeasured  Surgical drain unmeasured; this remains serous  She is NPO + TPN She is having bowel function   Vital signs in last 24 hours: [min-max] current  Temp:  [98.3 F (36.8 C)-99.4 F (37.4 C)] 98.4 F (36.9 C) (09/25 0810) Pulse Rate:  [77-86] 81 (09/25 0810) Resp:  [18-20] 18 (09/25 0810) BP: (102-114)/(57-67) 108/64 (09/25 0810) SpO2:  [97 %-99 %] 99 % (09/25 0810) Weight:  [74.9 kg] 74.9 kg (09/25 0424)     Height: 5' 1 (154.9 cm) Weight: 74.9 kg BMI (Calculated): 31.22   Intake/Output last 2 shifts:  09/24 0701 - 09/25 0700 In: 1993.3 [I.V.:1564.5; IV Piggyback:428.7] Out: -    Physical Exam:  Constitutional: alert, cooperative and no distress  Respiratory: breathing non-labored at rest  Cardiovascular: regular rate and sinus rhythm  Gastrointestinal: Soft, incisional soreness; distension improving, no rebound/guarding. Surgical drain in right abdomen; output serous  Integumentary: Laparotomy is CDI with staples, no erythema   Labs:     Latest Ref Rng & Units 12/18/2023    4:35 AM 12/17/2023    5:00 AM 12/16/2023    4:16 AM  CBC  WBC 4.0 - 10.5 K/uL 15.8  13.2  13.1   Hemoglobin 12.0 - 15.0 g/dL 9.5  9.7  9.6   Hematocrit 36.0 - 46.0 % 29.3  30.0  30.0   Platelets 150 - 400 K/uL 367  352  305       Latest Ref Rng & Units 12/18/2023    4:35 AM 12/17/2023    5:00 AM 12/16/2023    4:16 AM  CMP  Glucose 70 - 99 mg/dL 867  875  854   BUN 8 - 23 mg/dL 24  23  20    Creatinine 0.44 - 1.00 mg/dL 9.14  9.16  9.13   Sodium 135 - 145 mmol/L 136  138  137   Potassium  3.5 - 5.1 mmol/L 4.3  4.5  4.3   Chloride 98 - 111 mmol/L 103  104  105   CO2 22 - 32 mmol/L 26  26  25    Calcium 8.9 - 10.3 mg/dL 8.6  8.5  8.5   Total Protein 6.5 - 8.1 g/dL 5.8  5.5  5.4   Total Bilirubin 0.0 - 1.2 mg/dL 0.5  0.4  0.5   Alkaline Phos 38 - 126 U/L 87  72  69   AST 15 - 41 U/L 39  66  110   ALT 0 - 44 U/L 96  125  128     Imaging studies:  No new imaging studies   Assessment/Plan:  63 y.o. female 9 Days Post-Op s/p laparotomy, small bowel resection, and repair of jejunal ulceration just distal to gatrojejunostomy    - Follow up repeat UGI this morning to reassess intra-abdominal process. If there continues to be contrast extravasation, she likely need Tx to bariatric center for evaluation/management. Scheduled for 1200   - Considered replacement of NGT under fluoroscopy; however, there still remains risk of placing this through perforation even under fluor guidance. Will hold off on this for now.   -  She needs to remain strict NPO today pending UGI  - Continue to TPN for now; Still unclear when we will be able to resume PO intake and she is now without enteral feeding option  - Monitor leukocytosis; fluctuating  - Continue IV Abx (Zosyn ); Cx from OR without growth - Please do not stop yet given concern for perforation on UGI  - Continue surgical drain; monitor and record output closely  - Continue PPI; Pantoprazole  80 mg IV BID - Monitor abdominal examination; on-going bowel function - Pain control prn; antiemetics prn  - Mobilize; has done well with this  - Further management per primary service; we will follow    All of the above findings and recommendations were discussed with the patient, and the medical team, and all of patient's questions were answered to her expressed satisfaction.  -- Arthea Platt, PA-C  [facility] 12/18/2023, 9:23 AM M-F: 7am - 4pm

## 2023-12-18 NOTE — Progress Notes (Signed)
 Mobility Specialist - Progress Note   12/18/23 0926  Mobility  Activity Ambulated independently  Level of Assistance Modified independent, requires aide device or extra time  Assistive Device  (pushing IV pole)  Distance Ambulated (ft) 300 ft  Activity Response Tolerated well  Mobility visit 1 Mobility  Mobility Specialist Start Time (ACUTE ONLY) 0908  Mobility Specialist Stop Time (ACUTE ONLY) [id number]  Mobility Specialist Time Calculation (min) (ACUTE ONLY) 14 min   Pt amb ~ 300 ft in the hallway, tolerated well-- steady gait. Pt expressed feeling little to no gas this date. Min SOB noted during amb, one standing rest break required. Pt returned to the room, left seated EOB with needs within reach.  America Silvan Mobility Specialist 12/18/23 9:30 AM

## 2023-12-18 NOTE — Plan of Care (Signed)
  Problem: Education: Goal: Knowledge of General Education information will improve Description: Including pain rating scale, medication(s)/side effects and non-pharmacologic comfort measures Outcome: Progressing   Problem: Clinical Measurements: Goal: Respiratory complications will improve Outcome: Progressing   Problem: Clinical Measurements: Goal: Cardiovascular complication will be avoided Outcome: Progressing   Problem: Activity: Goal: Risk for activity intolerance will decrease Outcome: Progressing   Problem: Pain Managment: Goal: General experience of comfort will improve and/or be controlled Outcome: Progressing   Problem: Safety: Goal: Ability to remain free from injury will improve Outcome: Progressing

## 2023-12-18 NOTE — Progress Notes (Addendum)
 Progress Note    Sandra Chung  FMW:[id number] DOB: April 07, 1960  DOA: 12/07/2023 PCP: Jyl Railing, MD      Brief Narrative:    Medical records reviewed and are as summarized below:  Sandra Chung is a 63 y.o. female with past medical history significant for GERD, COPD, multiple prior abdominal surgeries including gastric bypass, hysterectomy, cholecystectomy, with entered to the hospital with nausea, vomiting and abdominal pain of about 2 days duration.  She was found to have small bowel obstruction.  She was initially managed conservatively with NG tube. She was having bowel movements as well as passing a Gastrografin  challenge however KUB continue to reveal obstructive pattern and she had significant pain.  Repeat CT abdomen pelvis 9/16 revealed NG tube appeared to have perforated anterior wall of jejunum.  Patient went for urgent exploratory laparotomy on 9/16.  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  Postoperatively patient was kept with NG tube until 9/22 at which time NG tube was removed.  Upper GI then revealed small perforation.   General surgeon recommended strict n.p.o. and artificial nutrition with TPN.        Assessment/Plan:   Principal Problem:   Small bowel obstruction (HCC) Active Problems:   Perforated ulcer of intestine (HCC)   Nutrition Problem: Inadequate oral intake Etiology: altered GI function  Signs/Symptoms: NPO status   Body mass index is 31.2 kg/m.  (class I obesity)    Small bowel obstruction with perforation, leukocytosis: - Ex lap 9/16: Laparotomy reduction of internal hernia, small bowel resection with primary stapled anastomosis, repair of jejunal ulceration just distal to gastrojejunostomy - NG tube was placed intraoperatively past the area of perforation.  Was removed on 9/22 - 9/22 upper GI shows  area of small perforation Repeat CT abdomen and pelvis on 12/16/2023 did not show contrast extravasation. Upper GI series on 12/18/2023 did not show any evidence of leak. She is still NPO and is on TPN for nutrition. Analgesics as needed for pain. Continue IV antibiotics. Follow-up with general surgeon for further recommendations.   Elevated liver enzymes: Improving.  Etiology unclear.  Continue to monitor.  CT abdomen showed liver cysts.   COPD: Stable.  Bronchodilators as needed.   Diet Order             Diet NPO time specified  Diet effective now                                  Consultants: General Surgeon  Procedures: 1. Laparotomy reduction of internal hernia on 12/09/2023 2. Small bowel resection with primary stapled anastomosis 3. Repair of jejunal ulceration just distal to gatrojejunostomy    Medications:    budesonide -glycopyrrolate -formoterol   2 puff Inhalation BID   Chlorhexidine  Gluconate Cloth  6 each Topical Daily   heparin  injection (subcutaneous)  5,000 Units Subcutaneous Q8H   insulin  aspart  0-9 Units Subcutaneous Q6H   montelukast   10 mg Oral QHS   pantoprazole  (PROTONIX ) IV  80 mg Intravenous Q12H   scopolamine   1 patch Transdermal Q72H   sodium chloride  flush  10-40 mL Intracatheter Q12H   Continuous Infusions:  acetaminophen  1,000 mg (12/18/23 0824)   copper  chloride 4 mg in dextrose  5 % 100 mL IVPB Stopped (12/17/23 1736)   piperacillin -tazobactam (ZOSYN )  IV 3.375 g (12/18/23 0937)  promethazine  (PHENERGAN ) injection (IM or IVPB) Stopped (12/09/23 1118)   TPN ADULT (ION) 70 mL/hr at 12/18/23 1307   TPN ADULT (ION)       Anti-infectives (From admission, onward)    Start     Dose/Rate Route Frequency Ordered Stop   12/09/23 2300  piperacillin -tazobactam (ZOSYN ) IVPB 3.375 g        3.375 g 12.5 mL/hr over 240 Minutes Intravenous Every 8 hours 12/09/23 1641     12/09/23 1445  piperacillin -tazobactam (ZOSYN ) IVPB 3.375 g         3.375 g 100 mL/hr over 30 Minutes Intravenous  Once 12/09/23 1440 12/09/23 1350   12/09/23 1330  cefoTEtan  (CEFOTAN ) 2 g in sodium chloride  0.9 % 100 mL IVPB        2 g 200 mL/hr over 30 Minutes Intravenous  Once 12/09/23 1316 12/09/23 1524              Family Communication/Anticipated D/C date and plan/Code Status   DVT prophylaxis: heparin  injection 5,000 Units Start: 12/10/23 2200 SCDs Start: 12/07/23 1139     Code Status: Full Code  Family Communication: None Disposition Plan: Plan to discharge home   Status is: Inpatient Remains inpatient appropriate because: Laparotomy       Subjective:   Interval events noted.  No complaints.  She feels better today.  No gas pain.  She is moving her bowels.  She says she sometimes has liquid stools at home and is not an unusual.  Objective:    Vitals:   12/17/23 1936 12/18/23 0356 12/18/23 0424 12/18/23 0810  BP: 102/67 (!) 114/57  108/64  Pulse: 86 77  81  Resp: 20 20  18   Temp: 99.1 F (37.3 C) 98.3 F (36.8 C)  98.4 F (36.9 C)  TempSrc:      SpO2: 98% 97%  99%  Weight:   74.9 kg   Height:       No data found.   Intake/Output Summary (Last 24 hours) at 12/18/2023 1415 Last data filed at 12/18/2023 1307 Gross per 24 hour  Intake 2672.26 ml  Output --  Net 2672.26 ml   Filed Weights   12/16/23 0313 12/17/23 0346 12/18/23 0424  Weight: 79.4 kg 80.2 kg 74.9 kg    Exam:  GEN: NAD SKIN: Warm and dry EYES: No pallor or icterus ENT: MMM CV: RRR PULM: CTA B ABD: soft, ND, NT, +BS, surgical incision is clean, dry and intact with staples CNS: AAO x 3, non focal EXT: No edema or tenderness       Data Reviewed:   I have personally reviewed following labs and imaging studies:  Labs: Labs show the following:   Basic Metabolic Panel: Recent Labs  Lab 12/14/23 0353 12/15/23 0507 12/16/23 0416 12/17/23 0500 12/18/23 0435  NA 138 134* 137 138 136  K 3.9 4.0 4.3 4.5 4.3  CL 104 101  105 104 103  CO2 27 26 25 26 26   GLUCOSE 129* 126* 145* 124* 132*  BUN 16 18 20 23  24*  CREATININE 0.63 0.74 0.86 0.83 0.85  CALCIUM 8.7* 8.4* 8.5* 8.5* 8.6*  MG 2.0 2.0 2.1 2.3 2.2  PHOS 2.8 3.4 3.3 3.4 3.7   GFR Estimated Creatinine Clearance: 61.9 mL/min (by C-G formula based on SCr of 0.85 mg/dL). Liver Function Tests: Recent Labs  Lab 12/14/23 0353 12/15/23 0507 12/16/23 0416 12/17/23 0500 12/18/23 0435  AST 24 61* 110* 66* 39  ALT 29 53* 128* 125* 96*  ALKPHOS 47 52 69 72 87  BILITOT 0.4 0.5 0.5 0.4 0.5  PROT 5.7* 5.7* 5.4* 5.5* 5.8*  ALBUMIN  2.6* 2.5* 2.4* 2.5* 2.5*   No results for input(s): LIPASE, AMYLASE in the last 168 hours. No results for input(s): AMMONIA in the last 168 hours. Coagulation profile No results for input(s): INR, PROTIME in the last 168 hours.  CBC: Recent Labs  Lab 12/14/23 0353 12/15/23 0507 12/16/23 0416 12/17/23 0500 12/18/23 0435  WBC 11.6* 15.6* 13.1* 13.2* 15.8*  NEUTROABS 8.0* 10.3* 8.3* 8.2* 11.0*  HGB 9.9* 10.2* 9.6* 9.7* 9.5*  HCT 30.6* 30.9* 30.0* 30.0* 29.3*  MCV 95.0 94.8 95.8 96.2 95.4  PLT 251 290 305 352 367   Cardiac Enzymes: No results for input(s): CKTOTAL, CKMB, CKMBINDEX, TROPONINI in the last 168 hours. BNP (last 3 results) No results for input(s): PROBNP in the last 8760 hours. CBG: Recent Labs  Lab 12/17/23 1548 12/17/23 1937 12/17/23 2335 12/18/23 0357 12/18/23 1141  GLUCAP 123* 123* 145* 126* 136*   D-Dimer: No results for input(s): DDIMER in the last 72 hours. Hgb A1c: No results for input(s): HGBA1C in the last 72 hours. Lipid Profile: Recent Labs    12/17/23 0521 12/18/23 0435  TRIG 239* 131   Thyroid function studies: No results for input(s): TSH, T4TOTAL, T3FREE, THYROIDAB in the last 72 hours.  Invalid input(s): FREET3 Anemia work up: No results for input(s): VITAMINB12, FOLATE, FERRITIN, TIBC, IRON, RETICCTPCT in the last 72  hours. Sepsis Labs: Recent Labs  Lab 12/15/23 0507 12/16/23 0416 12/17/23 0500 12/18/23 0435  WBC 15.6* 13.1* 13.2* 15.8*    Microbiology Recent Results (from the past 240 hours)  Aerobic/Anaerobic Culture w Gram Stain (surgical/deep wound)     Status: None   Collection Time: 12/09/23  2:28 PM   Specimen: Path fluid; Body Fluid  Result Value Ref Range Status   Specimen Description   Final    WOUND Performed at [facility], [address], [place], [place] [address]    Special Requests   Final    INTRA ABDOMINAL FLUID Performed at [facility], [address] [place], [place] [address]    Gram Stain   Final    FEW WBC PRESENT, PREDOMINANTLY PMN NO ORGANISMS SEEN    Culture   Final    No growth aerobically or anaerobically. Performed at [facility], [address], [place], [place] [address]    Report Status 12/14/2023 FINAL  Final  Resp panel by RT-PCR (RSV, Flu A&B, Covid) Anterior Nasal Swab     Status: None   Collection Time: 12/15/23  7:33 PM   Specimen: Anterior Nasal Swab  Result Value Ref Range Status   SARS Coronavirus 2 by RT PCR NEGATIVE NEGATIVE Final    Comment: (NOTE) SARS-CoV-2 target nucleic acids are NOT DETECTED.  The SARS-CoV-2 RNA is generally detectable in upper respiratory specimens during the acute phase of infection. The lowest concentration of SARS-CoV-2 viral copies this assay can detect is 138 copies/mL. A negative result does not preclude SARS-Cov-2 infection and should not be used as the sole basis for treatment or other patient management decisions. A negative result may occur with  improper specimen collection/handling, submission of specimen other than nasopharyngeal swab, presence of viral mutation(s) within the areas targeted by this assay, and inadequate number of viral copies(<138 copies/mL). A negative result must be combined with clinical observations, patient history, and epidemiological information.  The expected result is Negative.  Fact  Sheet for Patients:  BloggerCourse.com  Fact Sheet for Healthcare Providers:  SeriousBroker.it  This test is no t yet approved or cleared by the [place]  FDA and  has been authorized for detection and/or diagnosis of SARS-CoV-2 by FDA under an Emergency Use Authorization (EUA). This EUA will remain  in effect (meaning this test can be used) for the duration of the COVID-19 declaration under Section 564(b)(1) of the Act, 21 U.S.C.section 360bbb-3(b)(1), unless the authorization is terminated  or revoked sooner.       Influenza A by PCR NEGATIVE NEGATIVE Final   Influenza B by PCR NEGATIVE NEGATIVE Final    Comment: (NOTE) The Xpert Xpress SARS-CoV-2/FLU/RSV plus assay is intended as an aid in the diagnosis of influenza from Nasopharyngeal swab specimens and should not be used as a sole basis for treatment. Nasal washings and aspirates are unacceptable for Xpert Xpress SARS-CoV-2/FLU/RSV testing.  Fact Sheet for Patients: BloggerCourse.com  Fact Sheet for Healthcare Providers: SeriousBroker.it  This test is not yet approved or cleared by the [place]  FDA and has been authorized for detection and/or diagnosis of SARS-CoV-2 by FDA under an Emergency Use Authorization (EUA). This EUA will remain in effect (meaning this test can be used) for the duration of the COVID-19 declaration under Section 564(b)(1) of the Act, 21 U.S.C. section 360bbb-3(b)(1), unless the authorization is terminated or revoked.     Resp Syncytial Virus by PCR NEGATIVE NEGATIVE Final    Comment: (NOTE) Fact Sheet for Patients: BloggerCourse.com  Fact Sheet for Healthcare Providers: SeriousBroker.it  This test is not yet approved or cleared by the [place]  FDA and has been authorized for detection and/or  diagnosis of SARS-CoV-2 by FDA under an Emergency Use Authorization (EUA). This EUA will remain in effect (meaning this test can be used) for the duration of the COVID-19 declaration under Section 564(b)(1) of the Act, 21 U.S.C. section 360bbb-3(b)(1), unless the authorization is terminated or revoked.  Performed at [facility], [address] [place], [place] [address]     Procedures and diagnostic studies:  DG UGI W SINGLE CM (SOL OR THIN BA) Result Date: 12/18/2023 CLINICAL DATA:  63 year old female with a history of gastric bypass complicated by perforated jejunal ulcer s/p small-bowel resection and surgical repair on 9/16 with Dr JONETTA Luna. Previous UGI Single on 09/22 demonstrating possible small contained perforation in the proximal portion of the if efferent jejunal limb. Request for repeat UGI evaluation to assess for contained leak. EXAM: DG UGI W SINGLE CM TECHNIQUE: Scout radiograph was obtained. Single contrast examination was performed using thin liquid barium. This exam was performed by [facility] PA-C, and was supervised and interpreted by Dr. KANDICE Moan. FLUOROSCOPY: Radiation Exposure Index (as provided by the fluoroscopic device): 45 mGy Kerma COMPARISON:  DG UGI W SINGLE CM - 12/15/23 FINDINGS: Scout Radiograph: Postoperative staples and right upper quadrant drain visualized; otherwise unremarkable bowel gas pattern. No abnormal retained contrast identified from prior upper GI or CT studies. Esophagus:  Normal appearance. Esophageal motility:  Within normal limits. Stomach: Postoperative changes of gastric bypass surgery. No evidence of leak or extravasation at the gastrojejunal anastomosis or efferent jejunal limb. No anastomotic ulcer is identified. Previously noted abnormality by upper GI may be related to slight redundancy of the stomach just beyond the gastrojejunal anastomosis which is visible on today's study. Gastric emptying: Normal. Other:  None.  IMPRESSION: Unremarkable postoperative UGI evaluation. No evidence of leak on today's study. Previously noted abnormality by upper GI may  be related to slight redundancy of the stomach just beyond the gastrojejunal anastomosis which is visible on today's study. Electronically Signed   By: Marcey Moan M.D.   On: 12/18/2023 14:09               LOS: 11 days   Daimen Shovlin  [facility]   Pager on [email or web address]. If 7PM-7AM, please contact night-coverage at [email or web address]     12/18/2023, 2:15 PM

## 2023-12-19 LAB — CBC
HCT: 29.4 % — ABNORMAL LOW (ref 36.0–46.0)
Hemoglobin: 9.6 g/dL — ABNORMAL LOW (ref 12.0–15.0)
MCH: 31.3 pg (ref 26.0–34.0)
MCHC: 32.7 g/dL (ref 30.0–36.0)
MCV: 95.8 fL (ref 80.0–100.0)
Platelets: 408 K/uL — ABNORMAL HIGH (ref 150–400)
RBC: 3.07 MIL/uL — ABNORMAL LOW (ref 3.87–5.11)
RDW: 12.7 % (ref 11.5–15.5)
WBC: 16.8 K/uL — ABNORMAL HIGH (ref 4.0–10.5)
nRBC: 0.1 % (ref 0.0–0.2)

## 2023-12-19 LAB — COMPREHENSIVE METABOLIC PANEL WITH GFR
ALT: 74 U/L — ABNORMAL HIGH (ref 0–44)
AST: 27 U/L (ref 15–41)
Albumin: 2.5 g/dL — ABNORMAL LOW (ref 3.5–5.0)
Alkaline Phosphatase: 85 U/L (ref 38–126)
Anion gap: 9 (ref 5–15)
BUN: 24 mg/dL — ABNORMAL HIGH (ref 8–23)
CO2: 25 mmol/L (ref 22–32)
Calcium: 8.7 mg/dL — ABNORMAL LOW (ref 8.9–10.3)
Chloride: 105 mmol/L (ref 98–111)
Creatinine, Ser: 0.91 mg/dL (ref 0.44–1.00)
GFR, Estimated: >60 mL/min (ref >60)
Glucose, Bld: 103 mg/dL — ABNORMAL HIGH (ref 70–99)
Potassium: 4.5 mmol/L (ref 3.5–5.1)
Sodium: 139 mmol/L (ref 135–145)
Total Bilirubin: 0.3 mg/dL (ref 0.0–1.2)
Total Protein: 6.1 g/dL — ABNORMAL LOW (ref 6.5–8.1)

## 2023-12-19 LAB — MAGNESIUM: Magnesium: 2.2 mg/dL (ref 1.7–2.4)

## 2023-12-19 LAB — GLUCOSE, CAPILLARY
Glucose-Capillary: 136 mg/dL — ABNORMAL HIGH (ref 70–99)
Glucose-Capillary: 130 mg/dL — ABNORMAL HIGH (ref 70–99)
Glucose-Capillary: 115 mg/dL — ABNORMAL HIGH (ref 70–99)

## 2023-12-19 LAB — PHOSPHORUS: Phosphorus: 2.9 mg/dL (ref 2.5–4.6)

## 2023-12-19 MED ORDER — ONDANSETRON 4 MG PO TBDP: 4.0000 mg | ORAL_TABLET | Freq: Three times a day (TID) | ORAL | 0 refills | Status: AC | PRN

## 2023-12-19 MED ORDER — ZINC CHLORIDE 1 MG/ML IV SOLN
INTRAVENOUS | Status: DC
  Filled 2023-12-19: qty 347.2

## 2023-12-19 MED ORDER — OXYCODONE HCL 5 MG PO TABS: 5.0000 mg | ORAL_TABLET | Freq: Four times a day (QID) | ORAL | 0 refills | Status: AC | PRN

## 2023-12-19 NOTE — Progress Notes (Signed)
 [facility] SURGICAL PROGRESS NOTE  Hospital Day(s): 12.   Post op day(s): 10 Days Post-Op.   Interval History:  Patient seen and examined No acute events or new complaints overnight.  She is doing well She had some mild nausea intermittently  No significant abdominal pain No fever, chills, nausea, emesis Renal function normal; sCr - 0.91; UO - unmeasured  Surgical drain output 25 ccs; this remains serous  Now on FLD + TPN She is having bowel function   Vital signs in last 24 hours: [min-max] current  Temp:  [98 F (36.7 C)-99.3 F (37.4 C)] 98 F (36.7 C) (09/26 0511) Pulse Rate:  [81-96] 81 (09/26 0511) Resp:  [17-18] 17 (09/25 2059) BP: (108-118)/(59-64) 108/61 (09/26 0511) SpO2:  [96 %-99 %] 97 % (09/26 0511) Weight:  [75.2 kg] 75.2 kg (09/26 0511)     Height: 5' 1 (154.9 cm) Weight: 75.2 kg BMI (Calculated): 31.34   Intake/Output last 2 shifts:  09/25 0701 - 09/26 0700 In: 1549.2 [P.O.:240; I.V.:1309.2] Out: 25 [Drains:25]   Physical Exam:  Constitutional: alert, cooperative and no distress  Respiratory: breathing non-labored at rest  Cardiovascular: regular rate and sinus rhythm  Gastrointestinal: Soft, incisional soreness; distension improving, no rebound/guarding. Surgical drain in right abdomen; output serous (REMOVED) Integumentary: Laparotomy is CDI with staples, no erythema. To the inferior aspect there is an area of ecchymosis and induration consistent with likely hematoma, no drainage appreciable   Labs:     Latest Ref Rng & Units 12/18/2023    4:35 AM 12/17/2023    5:00 AM 12/16/2023    4:16 AM  CBC  WBC 4.0 - 10.5 K/uL 15.8  13.2  13.1   Hemoglobin 12.0 - 15.0 g/dL 9.5  9.7  9.6   Hematocrit 36.0 - 46.0 % 29.3  30.0  30.0   Platelets 150 - 400 K/uL 367  352  305       Latest Ref Rng & Units 12/19/2023    6:17 AM 12/18/2023    4:35 AM 12/17/2023    5:00 AM  CMP  Glucose 70 - 99 mg/dL 896  867  875   BUN 8 - 23 mg/dL 24  24  23     Creatinine 0.44 - 1.00 mg/dL 9.08  9.14  9.16   Sodium 135 - 145 mmol/L 139  136  138   Potassium 3.5 - 5.1 mmol/L 4.5  4.3  4.5   Chloride 98 - 111 mmol/L 105  103  104   CO2 22 - 32 mmol/L 25  26  26    Calcium 8.9 - 10.3 mg/dL 8.7  8.6  8.5   Total Protein 6.5 - 8.1 g/dL 6.1  5.8  5.5   Total Bilirubin 0.0 - 1.2 mg/dL 0.3  0.5  0.4   Alkaline Phos 38 - 126 U/L 85  87  72   AST 15 - 41 U/L 27  39  66   ALT 0 - 44 U/L 74  96  125     Imaging studies:  No new imaging studies   Assessment/Plan:  63 y.o. female 10 Days Post-Op s/p laparotomy, small bowel resection, and repair of jejunal ulceration just distal to gatrojejunostomy    - We will do FLD today at patient request; Soft diet ordered for tomorrow morning  - Wean TPN; plan to discontinue tomorrow (09/27)  - Monitor leukocytosis; fluctuating - CBC pending  - Continue IV Abx (Zosyn ); Cx from OR without growth - We can  potnetially stop this pending CBC  - Removed surgical drain; dressing placed - Continue PPI; Pantoprazole  80 mg IV BID - Monitor abdominal examination; on-going bowel function - Pain control prn; no NSAIDS - Antiemetics prn  - Mobilize; has done well with this  - Further management per primary service; we will follow     - Discharge Planning; She is doing well. Diet is progressing and we will wean TPN over the next 24 hours. If she is doing well, she can DC home tomorrow vs Sunday. I will arrange follow up, update instructions, and do pain medications Rx today.   All of the above findings and recommendations were discussed with the patient, and the medical team, and all of patient's questions were answered to her expressed satisfaction.  -- Arthea Platt, PA-C [facility] 12/19/2023, 7:36 AM M-F: 7am - 4pm

## 2023-12-19 NOTE — Progress Notes (Signed)
 PT Cancellation Note  Patient Details Name: Sandra Chung MRN: [id number] DOB: Dec 24, 1960   Cancelled Treatment:    Reason Eval/Treat Not Completed:  (Patient has already walked several times today and declined getting up again at this time. She is hopeful for discharge home over the weekend and plans to stay with her mom for support as needed.)  Randine Essex, PT, MPT  Randine LULLA Essex 12/19/2023, 2:50 PM

## 2023-12-19 NOTE — Progress Notes (Signed)
 Progress Note    Sandra Chung  FMW:[id number] DOB: 05-19-1960  DOA: 12/07/2023 PCP: Jyl Railing, MD      Brief Narrative:    Medical records reviewed and are as summarized below:  Sandra Chung is a 63 y.o. female with past medical history significant for GERD, COPD, multiple prior abdominal surgeries including gastric bypass, hysterectomy, cholecystectomy, with entered to the hospital with nausea, vomiting and abdominal pain of about 2 days duration.  She was found to have small bowel obstruction.  She was initially managed conservatively with NG tube. She was having bowel movements as well as passing a Gastrografin  challenge however KUB continue to reveal obstructive pattern and she had significant pain.  Repeat CT abdomen pelvis 9/16 revealed NG tube appeared to have perforated anterior wall of jejunum.  Patient went for urgent exploratory laparotomy on 9/16.  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  Postoperatively patient was kept with NG tube until 9/22 at which time NG tube was removed.  Upper GI then revealed small perforation.   General surgeon recommended strict n.p.o. and artificial nutrition with TPN.        Assessment/Plan:   Principal Problem:   Small bowel obstruction (HCC) Active Problems:   Perforated ulcer of intestine (HCC)   Nutrition Problem: Inadequate oral intake Etiology: altered GI function  Signs/Symptoms: NPO status   Body mass index is 31.33 kg/m.  (class I obesity)    Small bowel obstruction with perforation, leukocytosis: - Ex lap 9/16: Laparotomy reduction of internal hernia, small bowel resection with primary stapled anastomosis, repair of jejunal ulceration just distal to gastrojejunostomy - NG tube was placed intraoperatively past the area of perforation.  Was removed on 9/22 - 9/22 upper GI shows  area of small perforation Repeat CT abdomen and pelvis on 12/16/2023 did not show contrast extravasation. Upper GI series on 12/18/2023 did not show any evidence of leak. She has been started on soft diet.  She remains on TPN for adequate nutrition. Analgesics as needed for pain. Continue IV antibiotics. Persistent leukocytosis may be reactive Follow-up with general surgeon for further recommendations.   Elevated liver enzymes: Improving.  Etiology unclear.  Continue to monitor.  CT abdomen showed liver cysts.   COPD: Stable.  Bronchodilators as needed.   Diet Order             DIET SOFT Room service appropriate? Yes; Fluid consistency: Thin  Diet effective 1400                                  Consultants: General Surgeon  Procedures: 1. Laparotomy reduction of internal hernia on 12/09/2023 2. Small bowel resection with primary stapled anastomosis 3. Repair of jejunal ulceration just distal to gatrojejunostomy    Medications:    acetaminophen   1,000 mg Oral Q6H   budesonide -glycopyrrolate -formoterol   2 puff Inhalation BID   Chlorhexidine  Gluconate Cloth  6 each Topical Daily   heparin  injection (subcutaneous)  5,000 Units Subcutaneous Q8H   insulin  aspart  0-9 Units Subcutaneous Q6H   montelukast   10 mg Oral QHS   pantoprazole  (PROTONIX ) IV  80 mg Intravenous Q12H   scopolamine   1 patch Transdermal Q72H   sodium chloride  flush  10-40 mL Intracatheter Q12H   Continuous Infusions:  piperacillin -tazobactam (ZOSYN )  IV 3.375 g (12/19/23 0858)  promethazine  (PHENERGAN ) injection (IM or IVPB) Stopped (12/09/23 1118)   TPN ADULT (ION) 70 mL/hr at 12/18/23 1750   TPN ADULT (ION)       Anti-infectives (From admission, onward)    Start     Dose/Rate Route Frequency Ordered Stop   12/09/23 2300  piperacillin -tazobactam (ZOSYN ) IVPB 3.375 g        3.375 g 12.5 mL/hr over 240 Minutes Intravenous Every 8 hours 12/09/23 1641     12/09/23 1445   piperacillin -tazobactam (ZOSYN ) IVPB 3.375 g        3.375 g 100 mL/hr over 30 Minutes Intravenous  Once 12/09/23 1440 12/09/23 1350   12/09/23 1330  cefoTEtan  (CEFOTAN ) 2 g in sodium chloride  0.9 % 100 mL IVPB        2 g 200 mL/hr over 30 Minutes Intravenous  Once 12/09/23 1316 12/09/23 1524              Family Communication/Anticipated D/C date and plan/Code Status   DVT prophylaxis: heparin  injection 5,000 Units Start: 12/10/23 2200 SCDs Start: 12/07/23 1139     Code Status: Full Code  Family Communication: None Disposition Plan: Plan to discharge home   Status is: Inpatient Remains inpatient appropriate because: Laparotomy       Subjective:   No acute events overnight.  No complaints.  Abdominal pain is better.  She is tolerating clear liquid diet.  Overall, she feels better.  Objective:    Vitals:   12/18/23 1603 12/18/23 2059 12/19/23 0511 12/19/23 0744  BP: (!) 118/59 114/63 108/61 99/61  Pulse: 96 93 81 79  Resp: 18 17  16   Temp: 99.3 F (37.4 C) 98.2 F (36.8 C) 98 F (36.7 C) 98.1 F (36.7 C)  TempSrc:    Oral  SpO2: 99% 96% 97% 98%  Weight:   75.2 kg   Height:       No data found.   Intake/Output Summary (Last 24 hours) at 12/19/2023 1359 Last data filed at 12/19/2023 0900 Gross per 24 hour  Intake 1350.2 ml  Output 25 ml  Net 1325.2 ml   Filed Weights   12/17/23 0346 12/18/23 0424 12/19/23 0511  Weight: 80.2 kg 74.9 kg 75.2 kg    Exam:  GEN: NAD SKIN: Warm and dry EYES: No pallor or icterus ENT: MMM CV: RRR PULM: CTA B ABD: soft, ND, NT, +BS, + surgical incision is dry and intact. CNS: AAO x 3, non focal EXT: No edema or tenderness        Data Reviewed:   I have personally reviewed following labs and imaging studies:  Labs: Labs show the following:   Basic Metabolic Panel: Recent Labs  Lab 12/15/23 0507 12/16/23 0416 12/17/23 0500 12/18/23 0435 12/19/23 0617  NA 134* 137 138 136 139  K 4.0 4.3 4.5 4.3  4.5  CL 101 105 104 103 105  CO2 26 25 26 26 25   GLUCOSE 126* 145* 124* 132* 103*  BUN 18 20 23  24* 24*  CREATININE 0.74 0.86 0.83 0.85 0.91  CALCIUM 8.4* 8.5* 8.5* 8.6* 8.7*  MG 2.0 2.1 2.3 2.2 2.2  PHOS 3.4 3.3 3.4 3.7 2.9   GFR Estimated Creatinine Clearance: 58 mL/min (by C-G formula based on SCr of 0.91 mg/dL). Liver Function Tests: Recent Labs  Lab 12/15/23 0507 12/16/23 0416 12/17/23 0500 12/18/23 0435 12/19/23 0617  AST 61* 110* 66* 39 27  ALT 53* 128* 125* 96* 74*  ALKPHOS 52 69 72 87 85  BILITOT 0.5  0.5 0.4 0.5 0.3  PROT 5.7* 5.4* 5.5* 5.8* 6.1*  ALBUMIN  2.5* 2.4* 2.5* 2.5* 2.5*   No results for input(s): LIPASE, AMYLASE in the last 168 hours. No results for input(s): AMMONIA in the last 168 hours. Coagulation profile No results for input(s): INR, PROTIME in the last 168 hours.  CBC: Recent Labs  Lab 12/14/23 0353 12/15/23 0507 12/16/23 0416 12/17/23 0500 12/18/23 0435 12/19/23 0856  WBC 11.6* 15.6* 13.1* 13.2* 15.8* 16.8*  NEUTROABS 8.0* 10.3* 8.3* 8.2* 11.0*  --   HGB 9.9* 10.2* 9.6* 9.7* 9.5* 9.6*  HCT 30.6* 30.9* 30.0* 30.0* 29.3* 29.4*  MCV 95.0 94.8 95.8 96.2 95.4 95.8  PLT 251 290 305 352 367 408*   Cardiac Enzymes: No results for input(s): CKTOTAL, CKMB, CKMBINDEX, TROPONINI in the last 168 hours. BNP (last 3 results) No results for input(s): PROBNP in the last 8760 hours. CBG: Recent Labs  Lab 12/18/23 1141 12/18/23 1748 12/18/23 2336 12/19/23 0512 12/19/23 1126  GLUCAP 136* 111* 147* 136* 130*   D-Dimer: No results for input(s): DDIMER in the last 72 hours. Hgb A1c: No results for input(s): HGBA1C in the last 72 hours. Lipid Profile: Recent Labs    12/17/23 0521 12/18/23 0435  TRIG 239* 131   Thyroid function studies: No results for input(s): TSH, T4TOTAL, T3FREE, THYROIDAB in the last 72 hours.  Invalid input(s): FREET3 Anemia work up: No results for input(s): VITAMINB12, FOLATE,  FERRITIN, TIBC, IRON, RETICCTPCT in the last 72 hours. Sepsis Labs: Recent Labs  Lab 12/16/23 0416 12/17/23 0500 12/18/23 0435 12/19/23 0856  WBC 13.1* 13.2* 15.8* 16.8*    Microbiology Recent Results (from the past 240 hours)  Aerobic/Anaerobic Culture w Gram Stain (surgical/deep wound)     Status: None   Collection Time: 12/09/23  2:28 PM   Specimen: Path fluid; Body Fluid  Result Value Ref Range Status   Specimen Description   Final    WOUND Performed at [facility], [address] [place], [place] [address]    Special Requests   Final    INTRA ABDOMINAL FLUID Performed at [facility], [address] [place], [place] [address]    Gram Stain   Final    FEW WBC PRESENT, PREDOMINANTLY PMN NO ORGANISMS SEEN    Culture   Final    No growth aerobically or anaerobically. Performed at [facility], [address] [place], [place] [address]    Report Status 12/14/2023 FINAL  Final  Resp panel by RT-PCR (RSV, Flu A&B, Covid) Anterior Nasal Swab     Status: None   Collection Time: 12/15/23  7:33 PM   Specimen: Anterior Nasal Swab  Result Value Ref Range Status   SARS Coronavirus 2 by RT PCR NEGATIVE NEGATIVE Final    Comment: (NOTE) SARS-CoV-2 target nucleic acids are NOT DETECTED.  The SARS-CoV-2 RNA is generally detectable in upper respiratory specimens during the acute phase of infection. The lowest concentration of SARS-CoV-2 viral copies this assay can detect is 138 copies/mL. A negative result does not preclude SARS-Cov-2 infection and should not be used as the sole basis for treatment or other patient management decisions. A negative result may occur with  improper specimen collection/handling, submission of specimen other than nasopharyngeal swab, presence of viral mutation(s) within the areas targeted by this assay, and inadequate number of viral copies(<138 copies/mL). A negative result must be combined with clinical  observations, patient history, and epidemiological information. The expected result is Negative.  Fact Sheet for Patients:  BloggerCourse.com  Fact Sheet for Healthcare Providers:  SeriousBroker.it  This test is no t yet approved or cleared by the [place]  FDA and  has been authorized for detection and/or diagnosis of SARS-CoV-2 by FDA under an Emergency Use Authorization (EUA). This EUA will remain  in effect (meaning this test can be used) for the duration of the COVID-19 declaration under Section 564(b)(1) of the Act, 21 U.S.C.section 360bbb-3(b)(1), unless the authorization is terminated  or revoked sooner.       Influenza A by PCR NEGATIVE NEGATIVE Final   Influenza B by PCR NEGATIVE NEGATIVE Final    Comment: (NOTE) The Xpert Xpress SARS-CoV-2/FLU/RSV plus assay is intended as an aid in the diagnosis of influenza from Nasopharyngeal swab specimens and should not be used as a sole basis for treatment. Nasal washings and aspirates are unacceptable for Xpert Xpress SARS-CoV-2/FLU/RSV testing.  Fact Sheet for Patients: BloggerCourse.com  Fact Sheet for Healthcare Providers: SeriousBroker.it  This test is not yet approved or cleared by the [place]  FDA and has been authorized for detection and/or diagnosis of SARS-CoV-2 by FDA under an Emergency Use Authorization (EUA). This EUA will remain in effect (meaning this test can be used) for the duration of the COVID-19 declaration under Section 564(b)(1) of the Act, 21 U.S.C. section 360bbb-3(b)(1), unless the authorization is terminated or revoked.     Resp Syncytial Virus by PCR NEGATIVE NEGATIVE Final    Comment: (NOTE) Fact Sheet for Patients: BloggerCourse.com  Fact Sheet for Healthcare Providers: SeriousBroker.it  This test is not yet approved or cleared by  the [place]  FDA and has been authorized for detection and/or diagnosis of SARS-CoV-2 by FDA under an Emergency Use Authorization (EUA). This EUA will remain in effect (meaning this test can be used) for the duration of the COVID-19 declaration under Section 564(b)(1) of the Act, 21 U.S.C. section 360bbb-3(b)(1), unless the authorization is terminated or revoked.  Performed at [facility], [address] [place], [place] [address]     Procedures and diagnostic studies:  DG UGI W SINGLE CM (SOL OR THIN BA) Result Date: 12/18/2023 CLINICAL DATA:  63 year old female with a history of gastric bypass complicated by perforated jejunal ulcer s/p small-bowel resection and surgical repair on 9/16 with Dr JONETTA Luna. Previous UGI Single on 09/22 demonstrating possible small contained perforation in the proximal portion of the if efferent jejunal limb. Request for repeat UGI evaluation to assess for contained leak. EXAM: DG UGI W SINGLE CM TECHNIQUE: Scout radiograph was obtained. Single contrast examination was performed using thin liquid barium. This exam was performed by [facility] PA-C, and was supervised and interpreted by Dr. KANDICE Moan. FLUOROSCOPY: Radiation Exposure Index (as provided by the fluoroscopic device): 45 mGy Kerma COMPARISON:  DG UGI W SINGLE CM - 12/15/23 FINDINGS: Scout Radiograph: Postoperative staples and right upper quadrant drain visualized; otherwise unremarkable bowel gas pattern. No abnormal retained contrast identified from prior upper GI or CT studies. Esophagus:  Normal appearance. Esophageal motility:  Within normal limits. Stomach: Postoperative changes of gastric bypass surgery. No evidence of leak or extravasation at the gastrojejunal anastomosis or efferent jejunal limb. No anastomotic ulcer is identified. Previously noted abnormality by upper GI may be related to slight redundancy of the stomach just beyond the gastrojejunal anastomosis which is visible  on today's study. Gastric emptying: Normal. Other:  None. IMPRESSION: Unremarkable postoperative UGI evaluation. No evidence of leak on today's study. Previously noted abnormality by upper GI  may be related to slight redundancy of the stomach just beyond the gastrojejunal anastomosis which is visible on today's study. Electronically Signed   By: Marcey Moan M.D.   On: 12/18/2023 14:09               LOS: 12 days   Racheal Mathurin  [facility]   Pager on [email or web address]. If 7PM-7AM, please contact night-coverage at [email or web address]     12/19/2023, 1:59 PM

## 2023-12-19 NOTE — Plan of Care (Signed)

## 2023-12-19 NOTE — Plan of Care (Signed)
  Problem: Education: Goal: Knowledge of General Education information will improve Description: Including pain rating scale, medication(s)/side effects and non-pharmacologic comfort measures Outcome: Progressing   Problem: Clinical Measurements: Goal: Respiratory complications will improve Outcome: Progressing   Problem: Activity: Goal: Risk for activity intolerance will decrease Outcome: Progressing   Problem: Nutrition: Goal: Adequate nutrition will be maintained Outcome: Progressing   Problem: Pain Managment: Goal: General experience of comfort will improve and/or be controlled Outcome: Progressing

## 2023-12-19 NOTE — Consult Note (Signed)
 PHARMACY - TOTAL PARENTERAL NUTRITION CONSULT NOTE   Indication: Prolonged ileus  Patient Measurements: Height: 5' 1 (154.9 cm) Weight: 75.2 kg (165 lb 12.8 oz) IBW/kg (Calculated) : 47.8 TPN AdjBW (KG): 54.6 Body mass index is 31.33 kg/m. Usual Weight: 75.1 kg  Assessment:  63 yo F with PMH including GERD, extensive abdominal surgical history (including gastric bypass, hysterectomy, cholecystectomy) is presenting with SBO and perforation s/p resection and repair on 9/16.  Glucose / Insulin : no history of diabetes BG 103-147 3u insulin  in past 24 hours Electrolytes: WNL Renal: Scr 0.9>>>0.86>0.83>0.85>0.91 Hepatic:  LFTs: AST 61>110>66>39>27, ALT 53>128>125>96>74 TG 177>239>131 (reduced Lipids from 25 g/L to 22 g/L 9/24) Intake / Output; MIVF:   Intake/Output Summary (Last 24 hours) at 12/19/2023 0748 Last data filed at 12/19/2023 0400 Gross per 24 hour  Intake 1549.2 ml  Output 25 ml  Net 1524.2 ml    GI Imaging: 9/16 CTAP: Persistent high-grade small bowel obstruction, likely due to adhesions, but completely small-bowel obstruction is excluded. Significant dilation of the excluded stomach from enterogastric reflux.  9/22 UGI: Evidence of a small contained perforation in the proximal portion of the efferent jejunal limb (located approximately 4-5 cm downstream of the gastrojejunal anastomosis), correlating with the site of perforation on the recent CT study from 12/09/23.  9/25 UGI: Unremarkable postoperative UGI evaluation. No evidence of leak on today's study. Previously noted abnormality by upper GI may be related to slight redundancy of the stomach just beyond the gastrojejunal anastomosis which is visible on today's study. GI Surgeries / Procedures:  9/16 OR: 1. Laparotomy reduction of internal hernia 2. Small bowel resection with primary stapled anastomosis 3. Repair of jejunal ulceration just distal to gatrojejunostomy  Central access: PICC to be placed 9/17 TPN  start date: 9/17  Nutritional Goals: Goal TPN rate is 70 mL/hr (provides 104 g of protein and 1815 kcals per day)  RD Assessment: RD consulted, communicated: 1800-2100kcal/day, 90-105g/day protein and 1.5-1.7L/day fluid, she will need thiamine  for 7 days due to history of gastric bypass  Current Nutrition:  FLD  Plan:  Reduce TPN to 35 mL/hr at 1800 with plan to stop 9/27 Electrolytes in TPN: Na 32mEq/L, K 83mEq/L (reduced 9/24), Ca 69mEq/L, Mg 2mEq/L (reduced 9/24), and Phos 17mmol/L. Cl:Ac 1:1 Add standard MVI and trace elements to TPN Initiate Sensitive q6h SSI and adjust as needed Initiate thiamine  100mg  IV daily x 7 days (administering outside TPN)>>completed Add zinc  per RD.  Monitor TPN labs on Mon/Thurs   Kayla Mose Niels DOUGLAS, PharmD, BCPS Clinical Pharmacist 12/19/2023 7:48 AM

## 2023-12-19 NOTE — Discharge Instructions (Signed)
 In addition to included general post-operative instructions,  Diet: Resume home diet.   Activity: No heavy lifting >20 pounds (children, pets, laundry, garbage) or strenuous activity for 6 weeks, but light activity and walking are encouraged. Do not drive or drink alcohol if taking narcotic pain medications or having pain that might distract from driving. Continue abdominal binder with activity   Wound care: You may shower/get incision wet with soapy water  and pat dry (do not rub incisions), but no baths or submerging incision underwater until follow-up. Do not scrub or submerge incisions. We will remove staples in follow up.   Medications: Resume all home medications. For mild to moderate pain: acetaminophen  (Tylenol ) or ibuprofen/naproxen (if no kidney disease). Combining Tylenol  with alcohol can substantially increase your risk of causing liver disease. Narcotic pain medications, if prescribed, can be used for severe pain, though may cause nausea, constipation, and drowsiness. Do not combine Tylenol  and Percocet (or similar) within a 6 hour period as Percocet (and similar) contain(s) Tylenol . If you do not need the narcotic pain medication, you do not need to fill the prescription.  Call office [phone] / [phone]) at any time if any questions, worsening pain, fevers/chills, bleeding, drainage from incision site, or other concerns.

## 2023-12-20 LAB — CBC WITH DIFFERENTIAL/PLATELET
Abs Immature Granulocytes: 0.43 K/uL — ABNORMAL HIGH (ref 0.00–0.07)
Basophils Absolute: 0.1 K/uL (ref 0.0–0.1)
Basophils Relative: 0 %
Eosinophils Absolute: 0.7 K/uL — ABNORMAL HIGH (ref 0.0–0.5)
Eosinophils Relative: 5 %
HCT: 30.9 % — ABNORMAL LOW (ref 36.0–46.0)
Hemoglobin: 10.3 g/dL — ABNORMAL LOW (ref 12.0–15.0)
Immature Granulocytes: 3 %
Lymphocytes Relative: 9 %
Lymphs Abs: 1.5 K/uL (ref 0.7–4.0)
MCH: 31.5 pg (ref 26.0–34.0)
MCHC: 33.3 g/dL (ref 30.0–36.0)
MCV: 94.5 fL (ref 80.0–100.0)
Monocytes Absolute: 1.2 K/uL — ABNORMAL HIGH (ref 0.1–1.0)
Monocytes Relative: 7 %
Neutro Abs: 12 K/uL — ABNORMAL HIGH (ref 1.7–7.7)
Neutrophils Relative %: 76 %
Platelets: 439 K/uL — ABNORMAL HIGH (ref 150–400)
RBC: 3.27 MIL/uL — ABNORMAL LOW (ref 3.87–5.11)
RDW: 12.6 % (ref 11.5–15.5)
WBC: 15.8 K/uL — ABNORMAL HIGH (ref 4.0–10.5)
nRBC: 0 % (ref 0.0–0.2)

## 2023-12-20 LAB — GLUCOSE, CAPILLARY
Glucose-Capillary: 124 mg/dL — ABNORMAL HIGH (ref 70–99)
Glucose-Capillary: 116 mg/dL — ABNORMAL HIGH (ref 70–99)

## 2023-12-20 NOTE — Consult Note (Signed)
 PHARMACY - TOTAL PARENTERAL NUTRITION CONSULT NOTE   Indication: Prolonged ileus  Patient Measurements: Height: 5' 1 (154.9 cm) Weight: 74.8 kg (165 lb) IBW/kg (Calculated) : 47.8 TPN AdjBW (KG): 54.6 Body mass index is 31.18 kg/m. Usual Weight: 75.1 kg  Assessment:  63 yo F with PMH including GERD, extensive abdominal surgical history (including gastric bypass, hysterectomy, cholecystectomy) is presenting with SBO and perforation s/p resection and repair on 9/16.  Glucose / Insulin : no history of diabetes BG 103-147 2u insulin  in past 24 hours, sSSI q6h Electrolytes: WNL Renal: Scr 0.91 Hepatic:  LFTs: AST 61>110>66>39>27, ALT 53>128>125>96>74 TG 177>239>131 (reduced Lipids from 25 g/L to 22 g/L 9/24) Intake / Output; MIVF:   Intake/Output Summary (Last 24 hours) at 12/20/2023 1125 Last data filed at 12/20/2023 0855 Gross per 24 hour  Intake 883.46 ml  Output --  Net 883.46 ml    GI Imaging: 9/16 CTAP: Persistent high-grade small bowel obstruction, likely due to adhesions, but completely small-bowel obstruction is excluded. Significant dilation of the excluded stomach from enterogastric reflux.  9/22 UGI: Evidence of a small contained perforation in the proximal portion of the efferent jejunal limb (located approximately 4-5 cm downstream of the gastrojejunal anastomosis), correlating with the site of perforation on the recent CT study from 12/09/23.  9/25 UGI: Unremarkable postoperative UGI evaluation. No evidence of leak on today's study. Previously noted abnormality by upper GI may be related to slight redundancy of the stomach just beyond the gastrojejunal anastomosis which is visible on today's study. GI Surgeries / Procedures:  9/16 OR: 1. Laparotomy reduction of internal hernia 2. Small bowel resection with primary stapled anastomosis 3. Repair of jejunal ulceration just distal to gatrojejunostomy  Central access: PICC to be placed 9/17 TPN start date:  9/17  Nutritional Goals: Goal TPN rate is 70 mL/hr (provides 104 g of protein and 1815 kcals per day)  RD Assessment: RD consulted, communicated: 1800-2100kcal/day, 90-105g/day protein and 1.5-1.7L/day fluid, she will need thiamine  for 7 days due to history of gastric bypass  Current Nutrition:  Soft diet 9/26  Plan:  Wean off TPN 9/27 Electrolytes in TPN: Na 54mEq/L, K 73mEq/L (reduced 9/24), Ca 26mEq/L, Mg 59mEq/L (reduced 9/24), and Phos 63mmol/L. Cl:Ac 1:1 Add standard MVI and trace elements to TPN Initiate Sensitive q6h SSI and adjust as needed Initiate thiamine  100mg  IV daily x 7 days (administering outside TPN)>>completed Add zinc  per RD.  Monitor TPN labs on Mon/Thurs   Allean Haas PharmD Clinical Pharmacist 12/20/2023

## 2023-12-20 NOTE — TOC Progression Note (Signed)
 Transition of Care [facility]) - Progression Note    Patient Details  Name: ARTHA STAVROS MRN: [id number] Date of Birth: 02/13/1961  [facility] CM/SW Contact  Victory Jackquline RAMAN, RN Phone Number: 12/20/2023, 1:59 PM  Clinical Narrative:   RNCM spoke to patient. Pt declined Home Health/PT, states she doesn't think that she needs it right now. She's ambulating well at this time. Will continue to follow for D/C Planning/Care Coordination and update as applicable.     Expected Discharge Plan: Home/Self Care Barriers to Discharge: Continued Medical Work up               Expected Discharge Plan and Services     Post Acute Care Choice: Durable Medical Equipment Living arrangements for the past 2 months: Single Family Home                                       Social Drivers of Health (SDOH) Interventions SDOH Screenings   Food Insecurity: No Food Insecurity (12/07/2023)  Recent Concern: Food Insecurity - Food Insecurity Present (11/11/2023)   Received from [facility]  Housing: Low Risk  (12/07/2023)  Transportation Needs: No Transportation Needs (12/07/2023)  Utilities: Not At Risk (12/07/2023)  Recent Concern: Utilities - At Risk (11/11/2023)   Received from [facility]  Financial Resource Strain: Medium Risk (11/11/2023)   Received from [facility]  Tobacco Use: Low Risk  (12/07/2023)    Readmission Risk Interventions     No data to display

## 2023-12-20 NOTE — Progress Notes (Signed)
 [facility] SURGICAL PROGRESS NOTE  Hospital Day(s): 13.   Post op day(s): 11 Days Post-Op.   Interval History:  Patient seen and examined No acute events or new complaints overnight.  She is doing well No significant abdominal pain No fever, chills, nausea, emesis Now on soft diet + TPN She is having bowel function   Vital signs in last 24 hours: [min-max] current  Temp:  [98.1 F (36.7 C)-98.6 F (37 C)] 98.1 F (36.7 C) (09/27 0527) Pulse Rate:  [78-93] 78 (09/27 0527) Resp:  [17-18] 18 (09/27 0527) BP: (109-113)/(54-62) 113/54 (09/27 0527) SpO2:  [98 %] 98 % (09/27 0527) Weight:  [74.8 kg] 74.8 kg (09/27 0500)     Height: 5' 1 (154.9 cm) Weight: 74.8 kg BMI (Calculated): 31.19   Intake/Output last 2 shifts:  09/26 0701 - 09/27 0700 In: 1123.5 [P.O.:720; I.V.:403.5] Out: -    Physical Exam:  Constitutional: alert, cooperative and no distress  Respiratory: breathing non-labored at rest  Cardiovascular: regular rate and sinus rhythm  Gastrointestinal: Soft, incisional soreness; distension improving, no rebound/guarding. Surgical drain site in right abdomen, dressed. Integumentary: Laparotomy is CDI with staples, no erythema. To the inferior aspect there is an area of ecchymosis and induration consistent with likely hematoma, no drainage appreciable   Labs:     Latest Ref Rng & Units 12/19/2023    8:56 AM 12/18/2023    4:35 AM 12/17/2023    5:00 AM  CBC  WBC 4.0 - 10.5 K/uL 16.8  15.8  13.2   Hemoglobin 12.0 - 15.0 g/dL 9.6  9.5  9.7   Hematocrit 36.0 - 46.0 % 29.4  29.3  30.0   Platelets 150 - 400 K/uL 408  367  352       Latest Ref Rng & Units 12/19/2023    6:17 AM 12/18/2023    4:35 AM 12/17/2023    5:00 AM  CMP  Glucose 70 - 99 mg/dL 896  867  875   BUN 8 - 23 mg/dL 24  24  23    Creatinine 0.44 - 1.00 mg/dL 9.08  9.14  9.16   Sodium 135 - 145 mmol/L 139  136  138   Potassium 3.5 - 5.1 mmol/L 4.5  4.3  4.5   Chloride 98 - 111 mmol/L 105   103  104   CO2 22 - 32 mmol/L 25  26  26    Calcium 8.9 - 10.3 mg/dL 8.7  8.6  8.5   Total Protein 6.5 - 8.1 g/dL 6.1  5.8  5.5   Total Bilirubin 0.0 - 1.2 mg/dL 0.3  0.5  0.4   Alkaline Phos 38 - 126 U/L 85  87  72   AST 15 - 41 U/L 27  39  66   ALT 0 - 44 U/L 74  96  125     Imaging studies:  No new imaging studies   Assessment/Plan:  63 y.o. 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 Diet is progressing and we will d/c TPN prior to d/h today.   All of the above findings and recommendations were discussed with the patient, and the medical team, and all of patient's questions were answered to her expressed satisfaction.  -- Honor Leghorn, M.D. [facility] 12/20/2023, 12:17 PM

## 2023-12-20 NOTE — Progress Notes (Signed)
 Pt blood sugar at 116. Glucometer not sinking yet.

## 2023-12-20 NOTE — Discharge Summary (Signed)
 Physician Discharge Summary   Patient: Sandra Chung MRN: [id number] DOB: 01/07/1961  Admit date:     12/07/2023  Discharge date: 12/20/23  Discharge Physician: AIDA CHO   PCP: Jyl Railing, MD   Recommendations at discharge:   Follow-up with Dr. Jordis, general surgeon, on 12/24/2023. Follow-up with PCP in 1 to 2 weeks  Discharge Diagnoses: Principal Problem:   Small bowel obstruction (HCC) Active Problems:   Perforated ulcer of intestine (HCC)  Resolved Problems:   * No resolved hospital problems. *  Hospital Course:  Sandra Chung is a 63 y.o. female with past medical history significant for GERD, COPD, multiple prior abdominal surgeries including gastric bypass, hysterectomy, cholecystectomy, with entered to the hospital with nausea, vomiting and abdominal pain of about 2 days duration.   She was found to have small bowel obstruction.  She was initially managed conservatively with NG tube. She was having bowel movements as well as passing a Gastrografin  challenge however KUB continue to reveal obstructive pattern and she had significant pain.  Repeat CT abdomen pelvis 9/16 revealed NG tube appeared to have perforated anterior wall of jejunum.  Patient went for urgent exploratory laparotomy on 9/16.  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  Postoperatively patient was kept with NG tube until 9/22 at which time NG tube was removed.  Upper GI then revealed small perforation.    General surgeon recommended strict n.p.o. and artificial nutrition with TPN.   Assessment and Plan:   Small bowel obstruction with perforation, leukocytosis: - Ex lap 9/16: Laparotomy reduction of internal hernia, small bowel resection with primary stapled anastomosis, repair of jejunal ulceration just distal to gastrojejunostomy - NG tube was placed intraoperatively past  the area of perforation.  Was removed on 9/22 - 9/22 upper GI shows area of small perforation Repeat CT abdomen and pelvis on 12/16/2023 did not show contrast extravasation. Upper GI series on 12/18/2023 did not show any evidence of leak. She is tolerating a soft diet without any problems.  TPN has been discontinued. Analgesics as needed for pain. Persistent leukocytosis may be reactive Case discussed with Dr. Lane, general surgeon.  Patient is okay for discharge from his standpoint.      Elevated liver enzymes: Improving.  Etiology unclear.  Outpatient follow-up recommended.  CT abdomen showed liver cysts.     COPD: Stable.  Bronchodilators as needed.          Pain control - [place]  Controlled Substance Reporting System database was reviewed. and patient was instructed, not to drive, operate heavy machinery, perform activities at heights, swimming or participation in water  activities or provide baby-sitting services while on Pain, Sleep and Anxiety Medications; until their outpatient Physician has advised to do so again. Also recommended to not to take more than prescribed Pain, Sleep and Anxiety Medications.  Consultants: General Surgeon Procedures performed:    1. Laparotomy reduction of internal hernia on 12/09/2023 2. Small bowel resection with primary stapled anastomosis 3. Repair of jejunal ulceration just distal to gatrojejunostomy Disposition: Home Diet recommendation:  Discharge Diet Orders (From admission, onward)     Start     Ordered   12/20/23 0000  Diet - low sodium heart healthy        12/20/23 1424           Cardiac diet DISCHARGE MEDICATION: Allergies as of 12/20/2023       Reactions  Albuterol Shortness Of Breath   Can use proair Can use proair  Can use proair   Levaquin [levofloxacin In D5w] Anaphylaxis   Levofloxacin Anaphylaxis, Other (See Comments)   Other Reaction: Throat swells   Penicillins Hives   Says that she cannot take  Penicillin, but can take Amoxicillin        Medication List     STOP taking these medications    clotrimazole-betamethasone cream Commonly known as: LOTRISONE   meloxicam 15 MG tablet Commonly known as: MOBIC   traMADol 50 MG tablet Commonly known as: ULTRAM       TAKE these medications    acetaminophen  160 mg/5 mL Soln Commonly known as: TYLENOL  Take by mouth.   albuterol (2.5 MG/3ML) 0.083% nebulizer solution Commonly known as: PROVENTIL Inhale 2.5 mg into the lungs.   cholecalciferol 10 MCG (400 UNIT) Tabs tablet Commonly known as: VITAMIN D3 Take 400 Units by mouth daily.   cyclobenzaprine 5 MG tablet Commonly known as: FLEXERIL Take 5 mg by mouth daily as needed.   diphenhydrAMINE 25 mg capsule Commonly known as: BENADRYL Take 25 mg by mouth daily as needed.   docusate sodium 50 MG capsule Commonly known as: COLACE Take 100 mg by mouth at bedtime.   EasiVent inhaler See admin instructions.   estradiol 0.1 MG/GM vaginal cream Commonly known as: ESTRACE INSERT 1 GRAM VAGINALLY ONCE DAILY. PLACE NIGHTLY FOR 14 NIGHTS THEN 2 TIMES PER WEEK   folic acid 1 MG tablet Commonly known as: FOLVITE Take 1 mg by mouth.   gabapentin 100 MG capsule Commonly known as: NEURONTIN Take 100 mg by mouth as needed.   montelukast  10 MG tablet Commonly known as: SINGULAIR  Take 10 mg by mouth at bedtime.   ondansetron  4 MG disintegrating tablet Commonly known as: ZOFRAN -ODT Take 1 tablet (4 mg total) by mouth every 8 (eight) hours as needed for nausea or vomiting.   oxyCODONE  5 MG immediate release tablet Commonly known as: Oxy IR/ROXICODONE  Take 1 tablet (5 mg total) by mouth every 6 (six) hours as needed for breakthrough pain or severe pain (pain score 7-10).   pantoprazole  20 MG tablet Commonly known as: PROTONIX  Take 20 mg by mouth.   SUMAtriptan  50 MG tablet Commonly known as: IMITREX  Take 50 mg by mouth daily as needed.   Trelegy Ellipta  100-62.5-25 MCG/ACT Aepb Generic drug: Fluticasone-Umeclidin-Vilant Inhale 1 puff into the lungs daily.               Discharge Care Instructions  (From admission, onward)           Start     Ordered   12/20/23 0000  Discharge wound care:       Comments: Wound care  Daily      Comments: Cover midline wound with dry gauze, cover and secure. Change as needed. Okay to remove to shower   12/20/23 1424            Follow-up Information     Pabon, [place], MD. Go on 12/24/2023.   Specialty: General Surgery Why: Go to appointment on 10/01 at 945 AM Contact information: [address] [place] [address] [phone]                Discharge Exam: Fredricka Weights   12/18/23 0424 12/19/23 0511 12/20/23 0500  Weight: 74.9 kg 75.2 kg 74.8 kg   GEN: NAD SKIN: Warm and dry EYES: No pallor or icterus ENT: MMM CV: RRR PULM:  CTA B ABD: soft, ND, NT, +BS + surgical incision is clean, dry and intact CNS: AAO x 3, non focal EXT: No edema or tenderness   Condition at discharge: good  The results of significant diagnostics from this hospitalization (including imaging, microbiology, ancillary and laboratory) are listed below for reference.   Imaging Studies: DG UGI W SINGLE CM (SOL OR THIN BA) Result Date: 12/18/2023 CLINICAL DATA:  63 year old female with a history of gastric bypass complicated by perforated jejunal ulcer s/p small-bowel resection and surgical repair on 9/16 with Dr JONETTA Luna. Previous UGI Single on 09/22 demonstrating possible small contained perforation in the proximal portion of the if efferent jejunal limb. Request for repeat UGI evaluation to assess for contained leak. EXAM: DG UGI W SINGLE CM TECHNIQUE: Scout radiograph was obtained. Single contrast examination was performed using thin liquid barium. This exam was performed by [facility] PA-C, and was supervised and interpreted by Dr. KANDICE Moan. FLUOROSCOPY: Radiation  Exposure Index (as provided by the fluoroscopic device): 45 mGy Kerma COMPARISON:  DG UGI W SINGLE CM - 12/15/23 FINDINGS: Scout Radiograph: Postoperative staples and right upper quadrant drain visualized; otherwise unremarkable bowel gas pattern. No abnormal retained contrast identified from prior upper GI or CT studies. Esophagus:  Normal appearance. Esophageal motility:  Within normal limits. Stomach: Postoperative changes of gastric bypass surgery. No evidence of leak or extravasation at the gastrojejunal anastomosis or efferent jejunal limb. No anastomotic ulcer is identified. Previously noted abnormality by upper GI may be related to slight redundancy of the stomach just beyond the gastrojejunal anastomosis which is visible on today's study. Gastric emptying: Normal. Other:  None. IMPRESSION: Unremarkable postoperative UGI evaluation. No evidence of leak on today's study. Previously noted abnormality by upper GI may be related to slight redundancy of the stomach just beyond the gastrojejunal anastomosis which is visible on today's study. Electronically Signed   By: Marcey Moan M.D.   On: 12/18/2023 14:09   CT ABDOMEN PELVIS W CONTRAST Result Date: 12/16/2023 CLINICAL DATA:  Peritonitis or perforation suspected, history of gastric bypass. Smart ule ulcer perforation status post repair. Upper GI concerning for leak EXAM: CT ABDOMEN AND PELVIS WITH CONTRAST TECHNIQUE: Multidetector CT imaging of the abdomen and pelvis was performed using the standard protocol following bolus administration of intravenous contrast. RADIATION DOSE REDUCTION: This exam was performed according to the departmental dose-optimization program which includes automated exposure control, adjustment of the mA and/or kV according to patient size and/or use of iterative reconstruction technique. CONTRAST:  OMNIPAQUE  IOHEXOL  300 MG/ML  SOLN COMPARISON:  December 15, 2023 upper GI and December 09, 2023 CT abdomen and pelvis  FINDINGS: Lower chest: Mild bibasilar subsegmental atelectasis. Trace left pleural effusion. Hepatobiliary: Liver cysts measuring up to 3.0 cm. No suspicious liver lesions. Gallbladder surgically absent. No biliary ductal dilation. Pancreas: Unremarkable. Spleen: Unremarkable. Adrenals/Urinary Tract: Adrenal glands are unremarkable. No nephrolithiasis or hydronephrosis. Stomach/Bowel: Sequelae of prior gastric bypass and small hiatal hernia. Mild patulous dilation of the small bowel at the small bowel anastomoses likely representing postsurgical anatomy. No definite evidence of bowel obstruction. Oral contrast from prior upper GI seen to the level of the rectum. No oral contrast extravasation, although evaluation may be limited due to surgical drain in the upper abdomen. Vascular/Lymphatic: Normal caliber aorta. No lymphadenopathy by size criteria. Reproductive: Uterus is surgically absent.  No adnexal masses. Other: Postsurgical changes along the anterior abdominal wall with surgical staples in place. Tiny locules of free air in the along the  anterior abdominal wall, likely postsurgical. Trace volume free fluid with few areas of possible partially loculated fluid, particularly in the upper abdomen near the gastric bypass site, measuring 2.8 x 1.4 cm (CT 2: 25). Musculoskeletal: No acute osseous findings. IMPRESSION: 1. Expected postoperative changes following recent perforated marginal ulcer repair and sequelae of prior gastric bypass. Trace volume free fluid with a tiny partially loculated fluid collection adjacent to the stomach. No obvious oral contrast extravasation, although evaluation may be limited by surgical drain in the upper abdomen. Suspected small contained perforation is better seen on prior upper GI fluoroscopy study. 2. Small foci of free air along the anterior abdominal wall, likely postsurgical. Electronically Signed   By: Michaeline Blanch M.D.   On: 12/16/2023 11:59   DG UGI W SINGLE CM (SOL OR  THIN BA) Result Date: 12/15/2023 CLINICAL DATA:  63 year old female with history of gastric bypass complicated by perforated ulcer status post surgical repair by Dr. Jordis on 9/16. IR requested for anastomosis integrity check with contrasted study. EXAM: DG UGI W SINGLE CM TECHNIQUE: Single contrast examination was then performed using thin liquid barium. This exam was performed by Carlin Griffon, PA-C, and was supervised and interpreted by Selinda Blue, MD. FLUOROSCOPY: Radiation Exposure Index (as provided by the fluoroscopic device): 46.40 mGy Kerma COMPARISON:  CT abdomen pelvis with contrast media dated 12/09/2023. FINDINGS: Scout radiograph demonstrates skin staples vertically in the medial right abdomen and cholecystectomy clips in the right upper quadrant. Enteric tube courses into the right abdomen with tip overlying the midline. Dilated small bowel loops in the visualized upper abdomen compatible with postoperative ileus. Patient is status post gastric bypass surgery with gastrojejunal anastomosis. There is a hiatal hernia involving the remnant gastric pouch. The gastrojejunal anastomosis is widely patent and without leak at this site. In the proximal portion of the efferent jejunal limb (approximately 4-5 cm dowstream of the gastrojejunal anastomosis), there is evidence of a small contained perforation with an approximately 2 x 1 cm outpouching of contrast from the left posterior margin of the jejunal lumen, which correlates with the site of the NG tube perforation through the proximal efferent jejunal limb on the recent CT study. There is no associated jejunal stricture. Oral contrast transits to the mid jejunum. IMPRESSION: Evidence of a small contained perforation in the proximal portion of the efferent jejunal limb (located approximately 4-5 cm downstream of the gastrojejunal anastomosis), correlating with the site of perforation on the recent CT study from 12/09/23. NG tube is well positioned with the  tip in the jejunum well beyond the site of perforation. Dilated small bowel loops in the upper abdomen compatible with postoperative ileus. These results will be called to the ordering clinician or representative by the Radiologist Assistant, and communication documented in the PACS or [employer]. Electronically Signed   By: Selinda DELENA Blue M.D.   On: 12/15/2023 15:42   US  EKG SITE RITE Result Date: 12/10/2023 If Site Rite image not attached, placement could not be confirmed due to current cardiac rhythm.  CT ABDOMEN PELVIS W CONTRAST Result Date: 12/09/2023 CLINICAL DATA:  Bowel obstruction suspected History of bypass, with SBO, concern for possible internal hernia EXAM: CT ABDOMEN AND PELVIS WITH CONTRAST TECHNIQUE: Multidetector CT imaging of the abdomen and pelvis was performed using the standard protocol following bolus administration of intravenous contrast. RADIATION DOSE REDUCTION: This exam was performed according to the departmental dose-optimization program which includes automated exposure control, adjustment of the mA and/or kV according to patient size  and/or use of iterative reconstruction technique. CONTRAST:  OMNIPAQUE  IOHEXOL  300 MG/ML  SOLN COMPARISON:  12/07/2023, 12/08/2023, 12/09/2023 FINDINGS: Lower chest: No focal airspace consolidation or pleural effusion.Fibrolinear scarring in the lung bases. Hepatobiliary: Unchanged hepatic cysts.Cholecystectomy. Mild dilation of the common bile duct, likely related to the prior cholecystectomy. The portal veins are patent. Pancreas: No mass or main ductal dilation. No peripancreatic inflammation or fluid collection. Spleen: Normal size. No mass. Adrenals/Urinary Tract: No adrenal masses. No renal mass. No nephrolithiasis or hydronephrosis. The urinary bladder is completely decompressed. Stomach/Bowel: Postsurgical changes of a prior gastric bypass. Small to moderate-sized hiatal hernia. Esophagogastric tube in place, which appears to  penetrate the wall of the proximal jejunum terminating in the mesentery anterior to the proximal jejunal loop (transfer [employer] 25). The excluded stomach is dilated and fluid-filled, due to entero gastric reflux. Similar diffuse dilation of the small bowel with a transition point in the right lower quadrant. Enteric contrast has passed the transition point into the decompressed ileum and colon extending to the rectum.Normal appendix. Vascular/Lymphatic: No aortic aneurysm. No intraabdominal or pelvic lymphadenopathy. Reproductive: Hysterectomy. No concerning adnexal mass. Other: Mesenteric edema with small volume perihepatic ascites with fluid also visualized in the mesentery and pelvis. No pneumoperitoneum visualized. Musculoskeletal: No acute fracture or destructive lesion. Osteopenia. Multilevel degenerative disc disease of the spine. IMPRESSION: 1. Esophagogastric tube in place, which appears to penetrate the wall of the proximal jejunum, terminating in the mesentery anterior to the proximal jejunal limb. (Axial 25). Small volume free fluid in the abdomen or pelvis. No pneumoperitoneum. 2. Persistent findings of a high-grade small bowel obstruction, transition point in the right lower quadrant ileum, likely due to adhesions. Significant dilation of the excluded stomach from enterogastric reflux. Enteric contrast passes the transition point into the distal ileum and colon, which excludes complete small-bowel obstruction. Critical Value/emergent results were called by telephone at the time of interpretation on 12/09/2023 at 12:56 pm to provider [facility] PA , who verbally acknowledged these results. Electronically Signed   By: Rogelia Myers M.D.   On: 12/09/2023 13:00   DG ABD ACUTE 2+V W 1V CHEST Result Date: 12/09/2023 EXAM: UPRIGHT AND SUPINE XRAY VIEWS OF THE ABDOMEN AND 4 VIEW(S) OF THE CHEST 12/09/2023 07:57:19 AM COMPARISON: CT abdomen and pelvis 12/07/2023 CLINICAL HISTORY: SBO (small bowel  obstruction) (HCC) 881154. Pt currently admitted for small bowel obstruction FINDINGS: LUNGS AND PLEURA: No consolidation or pulmonary edema. No pleural effusion or pneumothorax. HEART AND MEDIASTINUM: Moderate-sized hiatal hernia. BOWEL: There are multiple dilated loops of small bowel within the central abdomen. Contrast noted throughout the colon. No bowel obstruction. PERITONEUM AND SOFT TISSUES: No abnormal calcifications. No free air. BONES: No acute osseous abnormality. LINES AND TUBES: Enteric tube tip and side port project over the stomach. Cholecystectomy clips. IMPRESSION: 1. Multiple dilated loops of small bowel within the central abdomen, consistent with small bowel obstruction. Electronically signed by: Donnice Mania MD 12/09/2023 08:34 AM EDT RP Workstation: [id number]   DG Abd Portable 1V-Small Bowel Obstruction Protocol-initial, 8 hr delay Result Date: 12/08/2023 CLINICAL DATA:  Small-bowel obstruction 8 hour delayed film. EXAM: PORTABLE ABDOMEN - 1 VIEW COMPARISON:  Earlier study today at 12:47 p.m. FINDINGS: 3:46 p.m. Contrast in the large bowel has progressed through into the rectum. Small bowel dilatation in the central abdomen however, is unchanged with small-bowel loops measuring up to 4.3 cm. Dilated small bowel contrast is seen in dilated loops in the left mid abdomen. There is faint contrast filling  of decompressed distal ileal loops in the right lower abdominopelvic quadrant. IMPRESSION: 1. Contrast in the large bowel has progressed through into the rectum. 2. Small bowel dilatation in the central abdomen is unchanged. 3. Continued faint contrast filling of decompressed distal ileal loops in the right lower abdominopelvic quadrant. Electronically Signed   By: Francis Quam M.D.   On: 12/08/2023 20:07   DG Abd 2 Views Result Date: 12/08/2023 CLINICAL DATA:  [id number] Small bowel obstruction (HCC) 881155. EXAM: ABDOMEN - 2 VIEW COMPARISON:  12/07/2023. FINDINGS: There are several  disproportionately dilated bowel loops overlying the left side of the abdomen with largest loop measuring up to 4.2 cm overlying the left lower quadrant. However, there is new faint positive contrast in the colon, thereby excluding high-grade bowel obstruction. No evidence of pneumoperitoneum. No acute osseous abnormalities. The soft tissues are within normal limits. Surgical changes, devices, tubes and lines: Enteric tube is seen with its tip in side hole overlying the left upper quadrant, overlying the proximal stomach region. There are surgical clips in the right upper quadrant, typical of a previous cholecystectomy. IMPRESSION: *Several disproportionately dilated small bowel loops overlying the left side of the abdomen with largest loop measuring up to 4.2 cm overlying the left lower quadrant. However, there is new faint positive contrast in the colon, thereby excluding high-grade bowel obstruction. Findings therefore favor residual/partial small bowel obstruction. Electronically Signed   By: Ree Molt M.D.   On: 12/08/2023 13:30   DG Abd Portable 1 View Result Date: 12/07/2023 CLINICAL DATA:  Nasogastric tube placement EXAM: PORTABLE ABDOMEN - 1 VIEW COMPARISON:  CT abdomen 12/07/2023 FINDINGS: Nasogastric tube side port is in the hiatal hernia and nasogastric tube tip is in the gastric fundus. Consider advancing 9 cm. Small to moderate-sized hiatal hernia. Dilated upper abdominal small bowel loops persist. Postoperative findings noted along the proximal stomach. IMPRESSION: 1. Nasogastric tube side port is in the hiatal hernia and nasogastric tube tip is in the gastric fundus. Consider advancing 9 cm. 2. Dilated upper abdominal small bowel loops persist. 3. Small to moderate-sized hiatal hernia. Electronically Signed   By: Ryan Salvage M.D.   On: 12/07/2023 10:24   CT ABDOMEN PELVIS W CONTRAST Result Date: 12/07/2023 EXAM: CT ABDOMEN AND PELVIS WITH CONTRAST 12/07/2023 08:43:46 AM TECHNIQUE: CT  of the abdomen and pelvis was performed with the administration of intravenous contrast. Multiplanar reformatted images are provided for review. Automated exposure control, iterative reconstruction, and/or weight-based adjustment of the mA/kV was utilized to reduce the radiation dose to as low as reasonably achievable. COMPARISON: None available. CLINICAL HISTORY: RLQ abdominal pain. FINDINGS: LOWER CHEST: Moderate-sized hiatal hernia identified. LIVER: Too small to characterize low-density structures within the inferior right lobe of liver, the largest measuring 8 mm (image 32/2). Simple-appearing liver cysts are noted within segments 4b and 8, the largest measuring 3.1 cm. GALLBLADDER AND BILE DUCTS: Increased caliber of the common bile duct measures 1.2 cm. No calcified stones identified along the course of the CBD. SPLEEN: No acute abnormality. PANCREAS: No acute abnormality. ADRENAL GLANDS: No acute abnormality. KIDNEYS, URETERS AND BLADDER: No stones in the kidneys or ureters. No hydronephrosis. No perinephric or periureteral stranding. Urinary bladder is unremarkable. GI AND BOWEL: Postoperative changes from previous gastric bypass surgery. There is abnormal dilatation of the proximal and mid small bowel loops with multiple air-fluid levels, measuring up to 4.6 cm in diameter. Transition point to decreased caliber distal small bowel is in the right lower quadrant of the abdomen (  image 51/2) where there is focal twisting of the bowel. Just beyond the transition point, the proximal nondilated small bowel exhibits mild wall thickening measuring up to 5 mm. No pneumatosis identified. No abnormal dilatation of the colon. PERITONEUM AND RETROPERITONEUM: Small volume of free fluid is noted within the mesentery, around the inferior margin of the right lobe of liver, and right hemipelvis. No loculated fluid collections identified. No signs of pneumoperitoneum. VASCULATURE: The mesenteric vasculature appears patent.  LYMPH NODES: No abdominal or pelvic adenopathy. REPRODUCTIVE ORGANS: Status post hysterectomy. No mass identified within either adnexa. BONES AND SOFT TISSUES: No acute osseous abnormality. No focal soft tissue abnormality. IMPRESSION: 1. The patient is status post gastric bypass with signs of high-grade small bowel obstruction The transition point is in the ventral aspect of the right lower quadrant of the abdomen where there is twisting of the bowel loops. Distal to the transition point, there is mild wall thickening of the small bowel with mild signs of pneumatosis. 2. Venous congestion with fluid and edema seen within the small bowel mesentery. 3. Free fluid noted within the abdomen and pelvis. No signs of abscess or pneumoperitoneum. 4. Moderate-sized hiatal hernia. 5. Increased caliber of the common bile duct measuring 1.2 cm. No calcified stones identified along the course of the CBD. Electronically signed by: Waddell Calk MD 12/07/2023 09:06 AM EDT RP Workstation: [id number]    Microbiology: Results for orders placed or performed during the hospital encounter of 12/07/23  Resp panel by RT-PCR (RSV, Flu A&B, Covid) Anterior Nasal Swab     Status: None   Collection Time: 12/07/23  9:05 AM   Specimen: Anterior Nasal Swab  Result Value Ref Range Status   SARS Coronavirus 2 by RT PCR NEGATIVE NEGATIVE Final    Comment: (NOTE) SARS-CoV-2 target nucleic acids are NOT DETECTED.  The SARS-CoV-2 RNA is generally detectable in upper respiratory specimens during the acute phase of infection. The lowest concentration of SARS-CoV-2 viral copies this assay can detect is 138 copies/mL. A negative result does not preclude SARS-Cov-2 infection and should not be used as the sole basis for treatment or other patient management decisions. A negative result may occur with  improper specimen collection/handling, submission of specimen other than nasopharyngeal swab, presence of viral mutation(s) within  the areas targeted by this assay, and inadequate number of viral copies(<138 copies/mL). A negative result must be combined with clinical observations, patient history, and epidemiological information. The expected result is Negative.  Fact Sheet for Patients:  BloggerCourse.com  Fact Sheet for Healthcare Providers:  SeriousBroker.it  This test is no t yet approved or cleared by the [place]  FDA and  has been authorized for detection and/or diagnosis of SARS-CoV-2 by FDA under an Emergency Use Authorization (EUA). This EUA will remain  in effect (meaning this test can be used) for the duration of the COVID-19 declaration under Section 564(b)(1) of the Act, 21 U.S.C.section 360bbb-3(b)(1), unless the authorization is terminated  or revoked sooner.       Influenza A by PCR NEGATIVE NEGATIVE Final   Influenza B by PCR NEGATIVE NEGATIVE Final    Comment: (NOTE) The Xpert Xpress SARS-CoV-2/FLU/RSV plus assay is intended as an aid in the diagnosis of influenza from Nasopharyngeal swab specimens and should not be used as a sole basis for treatment. Nasal washings and aspirates are unacceptable for Xpert Xpress SARS-CoV-2/FLU/RSV testing.  Fact Sheet for Patients: BloggerCourse.com  Fact Sheet for Healthcare Providers: SeriousBroker.it  This test is not yet approved or  cleared by the [place]  FDA and has been authorized for detection and/or diagnosis of SARS-CoV-2 by FDA under an Emergency Use Authorization (EUA). This EUA will remain in effect (meaning this test can be used) for the duration of the COVID-19 declaration under Section 564(b)(1) of the Act, 21 U.S.C. section 360bbb-3(b)(1), unless the authorization is terminated or revoked.     Resp Syncytial Virus by PCR NEGATIVE NEGATIVE Final    Comment: (NOTE) Fact Sheet for  Patients: BloggerCourse.com  Fact Sheet for Healthcare Providers: SeriousBroker.it  This test is not yet approved or cleared by the [place]  FDA and has been authorized for detection and/or diagnosis of SARS-CoV-2 by FDA under an Emergency Use Authorization (EUA). This EUA will remain in effect (meaning this test can be used) for the duration of the COVID-19 declaration under Section 564(b)(1) of the Act, 21 U.S.C. section 360bbb-3(b)(1), unless the authorization is terminated or revoked.  Performed at [facility], [address] [place], [place] [address]   Aerobic/Anaerobic Culture w Gram Stain (surgical/deep wound)     Status: None   Collection Time: 12/09/23  2:28 PM   Specimen: Path fluid; Body Fluid  Result Value Ref Range Status   Specimen Description   Final    WOUND Performed at [facility], [address] [place], [place] [address]    Special Requests   Final    INTRA ABDOMINAL FLUID Performed at [facility], [address] [place], [place] [address]    Gram Stain   Final    FEW WBC PRESENT, PREDOMINANTLY PMN NO ORGANISMS SEEN    Culture   Final    No growth aerobically or anaerobically. Performed at [facility], [address], [place], [place] [address]    Report Status 12/14/2023 FINAL  Final  Resp panel by RT-PCR (RSV, Flu A&B, Covid) Anterior Nasal Swab     Status: None   Collection Time: 12/15/23  7:33 PM   Specimen: Anterior Nasal Swab  Result Value Ref Range Status   SARS Coronavirus 2 by RT PCR NEGATIVE NEGATIVE Final    Comment: (NOTE) SARS-CoV-2 target nucleic acids are NOT DETECTED.  The SARS-CoV-2 RNA is generally detectable in upper respiratory specimens during the acute phase of infection. The lowest concentration of SARS-CoV-2 viral copies this assay can detect is 138 copies/mL. A negative result does not preclude SARS-Cov-2 infection and should not  be used as the sole basis for treatment or other patient management decisions. A negative result may occur with  improper specimen collection/handling, submission of specimen other than nasopharyngeal swab, presence of viral mutation(s) within the areas targeted by this assay, and inadequate number of viral copies(<138 copies/mL). A negative result must be combined with clinical observations, patient history, and epidemiological information. The expected result is Negative.  Fact Sheet for Patients:  BloggerCourse.com  Fact Sheet for Healthcare Providers:  SeriousBroker.it  This test is no t yet approved or cleared by the [place]  FDA and  has been authorized for detection and/or diagnosis of SARS-CoV-2 by FDA under an Emergency Use Authorization (EUA). This EUA will remain  in effect (meaning this test can be used) for the duration of the COVID-19 declaration under Section 564(b)(1) of the Act, 21 U.S.C.section 360bbb-3(b)(1), unless the authorization is terminated  or revoked sooner.       Influenza A by PCR NEGATIVE NEGATIVE Final   Influenza B by PCR NEGATIVE NEGATIVE Final    Comment: (NOTE) The Xpert  Xpress SARS-CoV-2/FLU/RSV plus assay is intended as an aid in the diagnosis of influenza from Nasopharyngeal swab specimens and should not be used as a sole basis for treatment. Nasal washings and aspirates are unacceptable for Xpert Xpress SARS-CoV-2/FLU/RSV testing.  Fact Sheet for Patients: BloggerCourse.com  Fact Sheet for Healthcare Providers: SeriousBroker.it  This test is not yet approved or cleared by the [place]  FDA and has been authorized for detection and/or diagnosis of SARS-CoV-2 by FDA under an Emergency Use Authorization (EUA). This EUA will remain in effect (meaning this test can be used) for the duration of the COVID-19 declaration under Section  564(b)(1) of the Act, 21 U.S.C. section 360bbb-3(b)(1), unless the authorization is terminated or revoked.     Resp Syncytial Virus by PCR NEGATIVE NEGATIVE Final    Comment: (NOTE) Fact Sheet for Patients: BloggerCourse.com  Fact Sheet for Healthcare Providers: SeriousBroker.it  This test is not yet approved or cleared by the [place]  FDA and has been authorized for detection and/or diagnosis of SARS-CoV-2 by FDA under an Emergency Use Authorization (EUA). This EUA will remain in effect (meaning this test can be used) for the duration of the COVID-19 declaration under Section 564(b)(1) of the Act, 21 U.S.C. section 360bbb-3(b)(1), unless the authorization is terminated or revoked.  Performed at [facility], [address] [place], [place] [address]     Labs: CBC: Recent Labs  Lab 12/15/23 0507 12/16/23 0416 12/17/23 0500 12/18/23 0435 12/19/23 0856 12/20/23 1209  WBC 15.6* 13.1* 13.2* 15.8* 16.8* 15.8*  NEUTROABS 10.3* 8.3* 8.2* 11.0*  --  12.0*  HGB 10.2* 9.6* 9.7* 9.5* 9.6* 10.3*  HCT 30.9* 30.0* 30.0* 29.3* 29.4* 30.9*  MCV 94.8 95.8 96.2 95.4 95.8 94.5  PLT 290 305 352 367 408* 439*   Basic Metabolic Panel: Recent Labs  Lab 12/15/23 0507 12/16/23 0416 12/17/23 0500 12/18/23 0435 12/19/23 0617  NA 134* 137 138 136 139  K 4.0 4.3 4.5 4.3 4.5  CL 101 105 104 103 105  CO2 26 25 26 26 25   GLUCOSE 126* 145* 124* 132* 103*  BUN 18 20 23  24* 24*  CREATININE 0.74 0.86 0.83 0.85 0.91  CALCIUM 8.4* 8.5* 8.5* 8.6* 8.7*  MG 2.0 2.1 2.3 2.2 2.2  PHOS 3.4 3.3 3.4 3.7 2.9   Liver Function Tests: Recent Labs  Lab 12/15/23 0507 12/16/23 0416 12/17/23 0500 12/18/23 0435 12/19/23 0617  AST 61* 110* 66* 39 27  ALT 53* 128* 125* 96* 74*  ALKPHOS 52 69 72 87 85  BILITOT 0.5 0.5 0.4 0.5 0.3  PROT 5.7* 5.4* 5.5* 5.8* 6.1*  ALBUMIN  2.5* 2.4* 2.5* 2.5* 2.5*   CBG: Recent Labs  Lab 12/19/23 0512  12/19/23 1126 12/19/23 1744 12/20/23 0526 12/20/23 1135  GLUCAP 136* 130* 115* 124* 116*    Discharge time spent: greater than 30 minutes.  Signed: AIDA CHO, MD [facility] 12/20/2023

## 2023-12-21 LAB — GLUCOSE, CAPILLARY: Glucose-Capillary: 116 mg/dL — ABNORMAL HIGH (ref 70–99)

## 2023-12-22 ENCOUNTER — Encounter: Payer: Self-pay | Admitting: Dietician

## 2023-12-22 NOTE — Progress Notes (Signed)
 Nutrition Brief Note  63 y/o female with h/o asthma, COPD, OSA, depression, GERD, HTN, HLD, remote cocaine use, hiatal hernia, chronic pain, SIBO, HCV, gastric ulcer secondary to H. pylori, rectocele repair s/p bladder sling, s/p hysterectomy, s/p cholecystectomy, morbid obesity s/p roux-en-y gastric bypass (2010), s/p diagnostic laparoscopy with LOA and tightening of mesenteric defects (2012), lymphedema, TIA and internal hernia and recent admission for peritonitis, SBO secondary to internal hernia & adhesions and perforated ulcer now s/p laparotomy with LOA, reduction of internal hernia, small bowel resection (~10cm) with primary stapled anastomosis & repair of jejunal ulceration (located just distal to gatrojejunostomy) 9/16.   Pt had vitamin labs sent out during her recent hospitalization as pt at high risk for deficiency r/t her h/o gastric bypass. Results are as follows:  Spoke with patient via phone and provided her with vitamin lab results and recommendations for supplementation.    Recommend:   -Copper  4mg  po daily x 30 days (pt received copper  chloride 4mg  IV daily x 7 days while hospitalized) -Cholecalciferol- 3000 international units po daily x 30 days  -Vitamin A  10,000 international units po daily x 30 days -Vitamin E 400 international units po daily x 30 days  -Zinc  50mg  (elemental) po daily x 30 days (pt received 5mg  daily added to her TPN while hospitalized) -Cyanocobalamin  1,000 mcg po daily x 30 days (patient received 1,000 mcg IM daily while hospitalized)  -Recommended bariatric multivitamin daily or 2 regular multivitamins twice daily (or Flintstones multivitamins daily) for life -Recommend calcium citrate 1200-1500mg  daily for life   Patient will need to follow up with PCP to have vitamin labs rechecked in 30-60 days after supplementation complete and then yearly once stable.   [email or web address]  Sandra Shams  MS, RD, LDN If unable to be reached, please send secure chat to RD inpatient available from 8:00a-4:00p daily

## 2023-12-24 ENCOUNTER — Encounter: Payer: Self-pay | Admitting: Surgery

## 2023-12-24 ENCOUNTER — Ambulatory Visit: Admitting: Surgery

## 2023-12-24 VITALS — BP 138/78 | HR 91 | Temp 98.3°F | Ht 61.0 in | Wt 158.8 lb

## 2023-12-24 DIAGNOSIS — Z09 Encounter for follow-up examination after completed treatment for conditions other than malignant neoplasm: Secondary | ICD-10-CM

## 2023-12-24 DIAGNOSIS — K631 Perforation of intestine (nontraumatic): Secondary | ICD-10-CM

## 2023-12-24 MED ORDER — AMOXICILLIN-POT CLAVULANATE 875-125 MG PO TABS: 1.0000 | ORAL_TABLET | Freq: Two times a day (BID) | ORAL | 0 refills | Status: AC

## 2023-12-24 NOTE — Patient Instructions (Signed)
 Short Bowel Syndrome  Short bowel syndrome is a condition in which a person loses the ability to absorb enough fluids and nutrients from their diet. When you eat and drink, food and fluids go from your stomach to your small intestine. This is where most of the nutrients in your food and fluids are absorbed. If you have lost more than half of your small intestine, you may not be able to eat or drink enough nutrients, vitamins, or fluids to stay healthy. Living with short bowel syndrome can be hard. Learn as much as you can about your condition, and work closely with all your health care providers. This may include a dietitian. What are the causes? A common cause of this condition is having a part of the small bowel, also called the small intestine, removed during surgery. You may have this type of surgery to treat: Inflammatory bowel disease, such as Crohn's disease. Cancer. Obesity. Blockages. Loss of blood supply. Other causes of small bowel loss include: Damage from X-ray treatments. Scar tissue. Trauma. Being born with a short small intestine. Damage to and loss of the lining of the small intestine wall in infants (necrotizing enterocolitis). What are the signs or symptoms? The most common symptom of this condition is long-term (chronic) diarrhea. Other symptoms of the condition include: Cramping and bloating. Heartburn. Greasy, bad-smelling stools (feces). Weight loss. Tiredness (fatigue). Swelling of the feet and ankles. Muscle loss or weakness. How is this diagnosed? Short bowel syndrome may be diagnosed based on your symptoms and medical history. Your health care provider may also do tests to see how much this condition is affecting your health and nutrition. These tests may include: Blood tests to check for: Low red blood cell count (anemia). Low levels of vitamins and minerals. Low levels of an important protein called albumin . Liver problems. Checking stool samples for fat  content. X-rays or other imaging studies of the small intestine, such as a barium swallow, CT scan, MRI, or ultrasound. How is this treated? Treatment for this condition differs from person to person. Your treatment will depend on the cause and how severe the symptoms are. If your short bowel syndrome developed from a past surgery, your symptoms may get better over a period of about 2 years as the rest of your small intestine adapts. The goals of treatment are to relieve symptoms and improve nutrition. Treatment may include: Changes to what you eat. You may need to work with a dietitian to find the best eating plan for you. Your new eating plan may include: More protein and healthy carbohydrates. Less fat and sugar. Supplements. These may help you: Get enough vitamins and minerals. Restore lost water  and fluids to your body (rehydrate). Get the nutrients you need. Medicines. You may need medicine to: Control diarrhea. Reduce stomach acid. Slow digestion. Improve growth and muscle strength. Parenteral nutrition. This is when you are fed through a tube that is inserted into a large vein in your chest, neck, or arm. Surgery. There are a few types of surgery that can help you absorb fluids and nutrients better. In some cases, the small bowel may be removed and replaced with one from a donor (intestinal transplant). Follow these instructions at home: Eating and drinking  Follow your eating plan. Keep a food diary to see which foods cause diarrhea or other symptoms. Eat small meals and snacks six to eight times a day instead of three large meals. Drink enough fluid to keep your urine pale yellow. Drink lots  of fluids between meals, but limit how much you drink during meals. Avoid caffeine and alcohol. General instructions Take over-the-counter and prescription medicines only as told by your health care provider. Stay active. Ask your health care provider what activities are safe for you. Make  sure you have a good support network at home. When you go out: Always find the nearest bathroom. Have a change of clothes on hand. Keep all follow-up visits. Your health care provider will monitor how you absorb and digest nutrients. Where to find more information [employer] of Diabetes and Digestive and Kidney Diseases: [email or web address] Contact a health care provider if: Your symptoms get worse, or you have new symptoms. You lose weight. You have trouble coping at home. You have blood in your urine. You have pain in the side of your abdomen, right below the ribs (flank pain). You have signs of dehydration, such as: Dark urine, very little urine, or no urine. Dry mouth or cracked lips. Sunken eyes. Sleepiness. Weakness. You have bloody or black stools or stools that look like tar. Your skin feels cold and clammy. You feel confused. You feel very weak, or you faint. Get help right away if: You have very bad pain. You have chest pain. You have trouble breathing. These symptoms may be an emergency. Get help right away. Call 911. Do not wait to see if the symptoms will go away. Do not drive yourself to the hospital. This information is not intended to replace advice given to you by your health care provider. Make sure you discuss any questions you have with your health care provider. Document Revised: 08/10/2021 Document Reviewed: 08/10/2021 Elsevier Patient Education  2024 [employer].

## 2023-12-24 NOTE — Progress Notes (Signed)
 Outpatient Surgical Follow Up  12/24/2023  Sandra Chung is an 63 y.o. female.   Chief Complaint  Patient presents with   Routine Post Op    SBO    HPI: 2 weeks s/p SB resection and repair marginal ulcer, Doing well, no fevers, taking po, ambulating  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 positive    History of PCR DNA positive for HSV2    Low serum vitamin B12    Malabsorption    Migraine with aura and without status migrainosus    Migraines    Mixed stress and urge urinary incontinence    Osteopenia of left hip    Osteopenia of spine    Rectocele    TIA (transient ischemic attack)     Past Surgical History:  Procedure Laterality Date   ABDOMINAL HYSTERECTOMY     total   BOWEL RESECTION N/A 12/09/2023   Procedure: EXCISION, SMALL INTESTINE;  Surgeon: Jordis Laneta FALCON, MD;  Location: [facility] ORS;  Service: General;  Laterality: N/A;   CESAREAN SECTION     CHOLECYSTECTOMY     COLONOSCOPY N/A 11/07/2023   Procedure: COLONOSCOPY;  Surgeon: Maryruth Ole DASEN, MD;  Location: [facility] ENDOSCOPY;  Service: Endoscopy;  Laterality: N/A;   DIAGNOSTIC LAPAROSCOPY     GASTRIC BYPASS     INCONTINENCE SURGERY N/A    LAPAROTOMY N/A 12/09/2023   Procedure: LAPAROTOMY, EXPLORATORY;  Surgeon: Jordis Laneta FALCON, MD;  Location: [facility] ORS;  Service: General;  Laterality: N/A;   REPAIR OF PERFORATED ULCER N/A 12/09/2023   Procedure: REPAIR, ULCER, PEPTIC, PERFORATED;  Surgeon: Jordis Laneta FALCON, MD;  Location: [facility] ORS;  Service: General;  Laterality: N/A;   REPAIR RECTOCELE N/A     Family History  Problem Relation Age of Onset   Breast cancer Maternal Aunt 71    Social History:  reports that she has never smoked. She has never used smokeless tobacco. She reports current alcohol use. She reports  that she does not use drugs.  Allergies:  Allergies  Allergen Reactions   Albuterol Shortness Of Breath    Can use proair  Can use proair  Can use proair   Levaquin [Levofloxacin In D5w] Anaphylaxis   Levofloxacin Anaphylaxis and Other (See Comments)    Other Reaction: Throat swells   Penicillins Hives    Says that she cannot take Penicillin, but can take Amoxicillin    Medications reviewed.    ROS Full ROS performed and is otherwise negative other than what is stated in HPI   BP 138/78   Pulse 91   Temp 98.3 F (36.8 C) (Oral)   Ht 5' 1 (1.549 m)   Wt 158 lb 12.8 oz (72 kg)   SpO2 95%   BMI 30.00 kg/m   Physical Exam  NAD Abd: soft, staples removed, some induration on lower portion from hematoma No peritonitis  Assessment/Plan:, Doing well w/o complications Augmentin for 1 week Expect lower portion of incision to improve RTC 2 weeks F/U w GI in a few weeks  Laneta Jordis, MD [facility] General Surgeon

## 2024-01-07 ENCOUNTER — Encounter: Payer: Self-pay | Admitting: Surgery

## 2024-01-07 ENCOUNTER — Ambulatory Visit: Admitting: Surgery

## 2024-01-07 VITALS — BP 130/84 | HR 98 | Temp 98.6°F | Ht 61.0 in | Wt 155.8 lb

## 2024-01-07 DIAGNOSIS — Z09 Encounter for follow-up examination after completed treatment for conditions other than malignant neoplasm: Secondary | ICD-10-CM

## 2024-01-07 DIAGNOSIS — K56609 Unspecified intestinal obstruction, unspecified as to partial versus complete obstruction: Secondary | ICD-10-CM

## 2024-01-07 MED ORDER — FLUCONAZOLE 150 MG PO TABS: 150.0000 mg | ORAL_TABLET | Freq: Once | ORAL | 0 refills | Status: AC

## 2024-01-07 MED ORDER — BACLOFEN 10 MG PO TABS: 10.0000 mg | ORAL_TABLET | Freq: Once | ORAL | 0 refills | Status: DC

## 2024-01-07 NOTE — Patient Instructions (Signed)
Small Bowel Series  A small bowel series is an X-ray test. It is used to check for problems in the small bowel. The small bowel is also called the small intestine. For this test, you will drink a liquid called barium. The liquid (contrast liquid) shows up well on X-rays. This makes it easier for your doctor to see any problems. The test can help to find out why you have symptoms such as: Belly (abdominal) pain. Bloating. Watery poop (diarrhea). Tell your doctor about: Any allergies you have, especially allergies to liquids used in imaging tests. All medicines you are taking. This includes vitamins, herbs, eye drops, creams, and over-the-counter medicines. Any blood disorders you have. Any surgeries you have had. Any medical conditions you have. Whether you are pregnant or may be pregnant. Whether you are breastfeeding. What are the risks? Usually, this is a safe test. However, problems may happen, such as: Feeling like you may vomit (nauseous) after drinking the contrast liquid. Cramps. Trouble pooping (constipation). A blockage in your small bowel getting worse. Exposure to a very small amount of radiation. Allergic reaction to the contrast liquid. This is rare. What happens before the test? Follow instructions from your doctor about what you cannot eat or drink. Ask your doctor about changing or stopping: Your normal medicines. Vitamins, herbs, and supplements. Over-the-counter medicines. What happens during the test? You will drink the contrast liquid. It looks like a milkshake. Using a type of X-ray, the doctor will watch the contrast liquid as it moves through: The part of your body that moves food from your mouth to your stomach (esophagus). Your stomach. Your small bowel. Plain X-ray pictures will be taken often as the contrast liquid moves through your small bowel. These may be taken every 15-60 minutes. You may need to change positions often so the doctor can see all of  the small bowel. You may need to stand up or lie on a table. The table may move or tilt. You may need to turn from side to side. The procedure may vary among doctors and hospitals. What can I expect after the test? Your poop (stool) may be white or gray for 2-3 days until all the contrast liquid has passed out of your body in your poop. It is up to you to get the results of your test. Ask how to get your results when they are ready. Talk with your doctor about what your results mean. Follow these instructions at home:  Return to your normal diet as told by your doctor. Return to your normal activities when your doctor says that it is safe. Check your poop to make sure that it returns to normal color within a few days. If told, take actions to prevent trouble pooping and to help get the contrast liquid out of your body. You may need to: Drink enough fluid to keep your pee (urine) pale yellow. Take medicines. You will be told what medicines to take. Eat foods that are high in fiber. These include beans, whole grains, and fresh fruits and vegetables. Limit foods that are high in fat and sugar. These include fried or sweet foods. Contact a doctor if: You have trouble pooping for more than 2 days. Your poop still looks white or chalky after 3 days. You have cramps or pain. You have watery poop. You have swelling of your belly. You feel like you may vomit or you vomit. You have a fever. Get help right away if: You are not able to  poop. You cannot pass gas. You have belly pain that gets worse. You have itchy, red, swollen areas of skin (hives). Your throat swells. You have trouble breathing. You have a very hard and bloated (distended) belly. These symptoms may be an emergency. Get help right away. Call your local emergency services (911 in the [place] Do not wait to see if the symptoms will go away. Do not drive yourself to the hospital. Summary A small bowel series is an X-ray test.  It is used to check for problems in the small bowel. For this test, you will drink a contrast liquid called barium. The liquid shows up well on X-rays and makes it easier for your doctor to see problems. After the test, your poop may be white or gray for 2-3 days until all the contrast liquid has passed out of your body. This information is not intended to replace advice given to you by your health care provider. Make sure you discuss any questions you have with your health care provider. Document Revised: 11/22/2020 Document Reviewed: 04/24/2020 Elsevier Patient Education  2024 [employer].

## 2024-01-08 NOTE — Progress Notes (Signed)
 Outpatient Surgical Follow Up  01/08/2024  Sandra Chung is an 63 y.o. female.   Chief Complaint  Patient presents with   Routine Post Op    SBO    HPI: 4 weeks s/p SB resection and repair marginal ulcer, Doing well, no fevers, taking po, ambulating , hematoma resolved   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 positive    History of PCR DNA positive for HSV2    Low serum vitamin B12    Malabsorption    Migraine with aura and without status migrainosus    Migraines    Mixed stress and urge urinary incontinence    Osteopenia of left hip    Osteopenia of spine    Rectocele    TIA (transient ischemic attack)     Past Surgical History:  Procedure Laterality Date   ABDOMINAL HYSTERECTOMY     total   BOWEL RESECTION N/A 12/09/2023   Procedure: EXCISION, SMALL INTESTINE;  Surgeon: Jordis Laneta FALCON, MD;  Location: [facility];  Service: General;  Laterality: N/A;   CESAREAN SECTION     CHOLECYSTECTOMY     COLONOSCOPY N/A 11/07/2023   Procedure: COLONOSCOPY;  Surgeon: Maryruth Ole DASEN, MD;  Location: [facility];  Service: Endoscopy;  Laterality: N/A;   DIAGNOSTIC LAPAROSCOPY     GASTRIC BYPASS     INCONTINENCE SURGERY N/A    LAPAROTOMY N/A 12/09/2023   Procedure: LAPAROTOMY, EXPLORATORY;  Surgeon: Jordis Laneta FALCON, MD;  Location: [facility];  Service: General;  Laterality: N/A;   REPAIR OF PERFORATED ULCER N/A 12/09/2023   Procedure: REPAIR, ULCER, PEPTIC, PERFORATED;  Surgeon: Jordis Laneta FALCON, MD;  Location: [facility];  Service: General;  Laterality: N/A;   REPAIR RECTOCELE N/A     Family History  Problem Relation Age of Onset   Breast cancer Maternal Aunt 58    Social History:  reports that she has never smoked. She has never used smokeless tobacco. She reports current  alcohol use. She reports that she does not use drugs.  Allergies:  Allergies  Allergen Reactions   Albuterol Shortness Of Breath    Can use proair  Can use proair  Can use proair   Levaquin [Levofloxacin In D5w] Anaphylaxis   Levofloxacin Anaphylaxis and Other (See Comments)    Other Reaction: Throat swells   Penicillins Hives    Says that she cannot take Penicillin, but can take Amoxicillin    Medications reviewed.    ROS Full ROS performed and is otherwise negative other than what is stated in HPI   BP 130/84   Pulse 98   Temp 98.6 F (37 C) (Oral)   Ht 5' 1 (1.549 m)   Wt 155 lb 12.8 oz (70.7 kg)   SpO2 96%   BMI 29.44 kg/m   Physical Exam  NAD Abd: soft, hematoma and induration resolved, Small area of seroma evacuated around 5cc, w/o purulence or infection No peritonitis   Assessment/Plan:, Doing well w/o complications RTC 2 months F/U w GI in a few weeks Advised her to get in touch w [facility] given the marginal ulcer   Laneta Jordis, MD [facility] General Surgeon

## 2024-01-14 ENCOUNTER — Telehealth: Payer: Self-pay | Admitting: Surgery

## 2024-01-14 ENCOUNTER — Other Ambulatory Visit: Payer: Self-pay

## 2024-01-14 MED ORDER — ONDANSETRON HCL 4 MG PO TABS: 4.0000 mg | ORAL_TABLET | Freq: Three times a day (TID) | ORAL | 0 refills | Status: AC | PRN

## 2024-01-14 NOTE — Telephone Encounter (Signed)
 Patient had exploratory laparotomy done 12/09/23 Dr. Jordis.  Patient is still having a lot of nausea and is requesting refill on her Zofran .  She uses [facility] on [address] in [place].  Please call patient to advise of medication being refilled.  Thank you.

## 2024-02-09 ENCOUNTER — Telehealth: Payer: Self-pay | Admitting: Surgery

## 2024-02-09 NOTE — Telephone Encounter (Signed)
 Pt asked if you could call her in a refill for the UTI and yeast. It has came back. Pt # is [phone]. Pharm is [facility] on [address]

## 2024-02-24 NOTE — Progress Notes (Signed)
 Chief Complaint  Patient presents with   Possible yeast infection   Discuss protonix  prescription    Subjective  Sandra Chung is a 63 y.o. female who presents for Possible yeast infection and Discuss protonix  prescription HPI Yeast infection? History of Present Illness Sandra Chung is a 63 year old female who presents with symptoms suggestive of a yeast infection.  Vaginal itchiness, irritation She experiences burning and itching sensations. She has a history of similar symptoms in the past, which were treated successfully with medication for a yeast infection and a urinary tract infection (UTI). Currently, she is experiencing similar symptoms as before, including urinary frequency and urgency, which started this morning. Her urine appears darker than usual, although she has been drinking fluids throughout the day.  She has a long-standing history of vaginal discharge since the age of 61, which she describes as clear to white and slightly opaque. The amount of discharge has not changed recently.  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 She has run out of this medication and is seeking a refill.    Review of Systems The pertinent positive and negative findings are as noted in the HPI.   Patient Active Problem List  Diagnosis   Depressive disorder, not elsewhere classified   COPD (chronic obstructive pulmonary disease) , unspecified (CMS-HCC)   GERD (gastroesophageal reflux disease)   Malabsorption (HHS-HCC)   Abdominal pain, lower (chronic scar tissue issues)   Pain, chronic- tramadol for chronic abd pain from adhesions   Hepatitis C antibody test positive   Atypical chest pain   Abnormal head CT   Low serum vitamin B12   Chronic midline thoracic back pain   Chronic midline low back pain without sciatica   Migraine with aura and without status migrainosus, not intractable    Mixed stress and urge urinary incontinence   Osteopenia of spine   Osteopenia of left hip   HSV2   Rectocele   Hypertonicity of bladder   Bariatric surgery status   Abnormal granulation tissue   Follow-up examination following surgery   Depressive disorder    Outpatient Medications Prior to Visit  Medication Sig Dispense Refill   ACETAMINOPHEN  (TYLENOL  ORAL) Take by mouth.     albuterol (PROVENTIL) 2.5 mg /3 mL (0.083 %) nebulizer solution Take 3 mLs (2.5 mg total) by nebulization every 6 (six) hours as needed for Wheezing or Shortness of Breath for up to 30 days 360 mL 3   albuterol MDI, PROVENTIL, VENTOLIN, PROAIR, HFA 90 mcg/actuation inhaler INHALE 2 PUFFS BY MOUTH EVERY 6 HOURS AS NEEDED FOR WHEEZING OR SHORTNESS OF BREATH 18 g 6   apoaequorin (PREVAGEN) capsule Take by mouth once daily     cholecalciferol (VITAMIN D3) 1000 unit tablet Take 1 tablet (1,000 Units total) by mouth once daily 30 tablet 11   clotrimazole-betamethasone (LOTRISONE) 1-0.05 % cream Apply topically once daily as needed.      cyclobenzaprine (FLEXERIL) 10 MG tablet Take 5 mg by mouth     estradioL (ESTRACE) 0.01 % (0.1 mg/gram) vaginal cream INSERT 1 GRAM VAGINALLY ONCE DAILY. PLACE NIGHTLY FOR 14 NIGHTS THEN 2 TIMES PER WEEK     fluticasone-umeclidinium-vilanterol (TRELEGY ELLIPTA) 100-62.5-25 mcg inhaler Inhale 1 Puff into the lungs once daily 180 each 4   gabapentin (NEURONTIN) 100 MG capsule Take 1 capsule by mouth three times daily as needed 100 capsule 1   montelukast  (SINGULAIR ) 10 mg  tablet Take 1 tablet (10 mg total) by mouth once daily for 90 days 90 tablet 3   ondansetron  (ZOFRAN -ODT) 4 MG disintegrating tablet Take 4 mg by mouth every 8 (eight) hours as needed for Nausea     pantoprazole  (PROTONIX ) 20 MG DR tablet Take 1 tablet (20 mg total) by mouth 2 (two) times daily before meals (Patient taking differently: Take 20 mg by mouth once daily) 60 tablet 11   SUMAtriptan   (IMITREX ) 50 MG tablet Take 1 tablet (50 mg total) by mouth once as needed for Migraine for up to 1 dose 10 tablet 11   traMADol (ULTRAM) 50 mg tablet Take 50 mg by mouth every 6 (six) hours as needed for Pain.     meloxicam (MOBIC) 15 MG tablet TAKE 1 TABLET BY MOUTH ONCE DAILY FOR JAW PAIN     diphenhydrAMINE (BENADRYL) 25 mg capsule Take 25 mg by mouth (Patient not taking: Reported on 02/25/2024)     docusate (COLACE) 100 MG capsule Take 1 capsule (100 mg total) by mouth 2 (two) times daily as needed. (Patient not taking: Reported on 02/25/2024) 60 capsule 11   sod picosulf-mag ox-citric ac (CLENPIQ) 10 mg-3.5 gram- 12 gram/160 mL Soln Take 160 mLs by mouth as directed GIVE 2 BOTTLES (Patient not taking: Reported on 02/25/2024) 320 mL 0   sod picosulf-mag ox-citric ac (CLENPIQ) 10 mg-3.5 gram- 12 gram/175 mL Soln Take 175 mLs by mouth as directed Give 2 bottles (Patient not taking: Reported on 02/25/2024) 350 mL 0   venlafaxine (EFFEXOR) 25 MG tablet Take 25 mg nightly for 1 week, then increase to 25 mg twice daily and continue this dose. (Patient not taking: Reported on 02/25/2024) 60 tablet 11   Facility-Administered Medications Prior to Visit  Medication Dose Route Frequency Provider Last Rate Last Admin   cyanocobalamin  (VITAMIN B12) injection 1,000 mcg  1,000 mcg Subcutaneous Q30 Days Jyl Heron Haff, MD   1,000 mcg at 06/28/22 0933      Objective  Vitals:   02/25/24 1523  BP: 119/78  Pulse: 96  Temp: 36.7 C (98.1 F)  TempSrc: Axillary Probe  Weight: 68.2 kg (150 lb 6.4 oz)  PainSc: 0-No pain   Body mass index is 28.44 kg/m.  Home Vitals:     Physical Exam Constitutional:      Appearance: Normal appearance.  HENT:     Head: Normocephalic.     Nose: Nose normal.     Mouth/Throat:     Mouth: Mucous membranes are moist.  Pulmonary:     Effort: Pulmonary effort is normal.  Abdominal:     General: Abdomen is flat.  Musculoskeletal:        General: Normal  range of motion.     Cervical back: Normal range of motion.  Skin:    General: Skin is warm.  Neurological:     Mental Status: She is alert and oriented to person, place, and time.  Psychiatric:        Mood and Affect: Mood normal.        Behavior: Behavior normal.    Physical Exam    Results LABS Urinalysis: Negative for abnormalities, concentrated urine, clear (02/25/2024)     Assessment/Plan:   Assessment & Plan Acute vulvovaginal candidiasis Symptoms consistent with yeast infection. Previous successful treatment with fluconazole . POC UA was clear, no sign of UTI. - Prescribed fluconazole  with instructions to take one dose today and another if symptoms persist by Saturday. - Sent self-swab for yeast  infection and bacterial vaginosis testing. - If bacterial vaginosis test is positive, will prescribe metronidazole twice daily for seven days.  Chronic temporomandibular joint pain Chronic low back pain Chronic TMJ pain managed with meloxicam, effective in reducing symptoms. - Prescribed meloxicam for one year with 30-day supply.  Diagnoses and all orders for this visit:  Vaginal yeast infection -     fluconazole  (DIFLUCAN ) 150 MG tablet; Take 1 tablet (150 mg total) by mouth once daily for 2 doses Take 1 dose on day1, if you continue to have symptoms on day 4, take another dose.  Vaginal itching -     Bacterial Vaginosis Gram Stain  UTI symptoms -     [facility] POC Urinalysis Chemical w/Option to Reflex Urine Culture; Future  Chronic TMJ pain -     meloxicam (MOBIC) 15 MG tablet; Take 1 tablet (15 mg total) by mouth once daily  Chronic midline low back pain without sciatica -     meloxicam (MOBIC) 15 MG tablet; Take 1 tablet (15 mg total) by mouth once daily   This plan was discussed with the patient and questions were answered. Follow up as indicated, or sooner should any new problems arise, if conditions worsen, or if they are otherwise concerned.   See patient  instructions for additional information.   Nao Souma, MD PhD    [facility]  This visit was coded based on medical decision making (MDM).           Future Appointments     Date/Time Provider Department Center Visit Type   07/27/2024 2:40 PM (Arrive by 2:25 PM) Vernette Jeoffrey Parkin, MD [facility] RET[facility]       There are no Patient Instructions on file for this visit.  An after visit summary was provided for the patient either in written format (printed) or through [facility].  This note has been created using automated tools and reviewed for accuracy by NAO SOUMA.

## 2024-03-08 ENCOUNTER — Encounter: Payer: Self-pay | Admitting: Surgery

## 2024-03-08 ENCOUNTER — Ambulatory Visit: Admitting: Surgery

## 2024-03-08 VITALS — BP 138/84 | HR 99 | Temp 98.2°F | Ht 61.0 in | Wt 151.0 lb

## 2024-03-08 DIAGNOSIS — Z09 Encounter for follow-up examination after completed treatment for conditions other than malignant neoplasm: Secondary | ICD-10-CM

## 2024-03-08 DIAGNOSIS — K631 Perforation of intestine (nontraumatic): Secondary | ICD-10-CM

## 2024-03-08 NOTE — Patient Instructions (Signed)
 Exploratory Laparotomy, Adult, Care After After exploratory laparotomy, it's common to have pain, tiredness, bloating, and gas. You may also have no desire to eat food. Follow these instructions at home: Medicines Take over-the-counter and prescription medicines only as told by your health care provider. Take your antibiotics as told by your provider. Do not stop taking your antibiotics even if you start to feel better. Ask your provider if the medicine prescribed to you: Requires you to avoid driving or using machinery. Can cause constipation, or trouble pooping. You may need to take these actions to prevent or treat pooping problems: Drink enough fluid to keep your pee (urine) pale yellow. Take over-the-counter or prescription medicines. Eat foods that are high in fiber, such as beans, whole grains, and fresh fruits and vegetables. Limit foods that are high in fat and processed sugars, such as fried or sweet foods. Incision care  Follow instructions from your provider about how to take care of your incision, or the cut in your belly. Make sure you: Wash your hands with soap and water for at least 20 seconds before and after you change your bandage. If soap and water are not available, use hand sanitizer. Change your bandage as told by your provider. Leave stitches, skin glue, or tape strips in place. These skin closures may need to stay in place for 2 weeks or longer. If tape strip edges start to loosen and curl up, you may trim the loose edges. Do not remove tape strips completely unless your provider tells you to do that. If you were sent home with a drain, follow instructions from your provider about how to care for it. Check the cut in your belly every day for signs of infection. Check for: Redness, swelling, or more pain. Fluid or blood. Warmth. Pus or a bad smell. Activity  Rest as told by your provider. Do not sit for a long time without moving. Get up to take short walks every  1-2 hours. This will improve blood flow and breathing. Ask for help if you feel weak or unsteady. You may have to avoid lifting. Ask your provider how much you can safely lift. Return to your normal activities as told by your provider. Ask your provider what activities are safe for you. General instructions Do not use any products that contain nicotine or tobacco. These products include cigarettes, chewing tobacco, and vaping devices, such as e-cigarettes. These can delay incision healing after surgery. If you need help quitting, ask your provider. Do not take baths, swim, or use a hot tub until your provider approves. Ask your provider if you may take showers. You may only be allowed to take sponge baths. Keep all follow-up visits. Your provider will check for problems and make sure you are healing. Your provider may give you more instructions. Make sure you know what you can and can't do. Contact a health care provider if: You have a fever or chills. Your pain is getting worse or your pain medicine is not helping. You vomit or feel like you may vomit. You have watery poop. You have trouble pooping or have not had a bowel movement for more than 3 days. You have signs of infection around the cut that was made in your belly. Get help right away if: The cut in your belly opens up. You have warmth, tenderness, or swelling in your calf. You have trouble breathing. You have chest pain. These symptoms may be an emergency. Get help right away. Call 911. Do  not wait to see if the symptoms will go away. Do not drive yourself to the hospital. This information is not intended to replace advice given to you by your health care provider. Make sure you discuss any questions you have with your health care provider. Document Revised: 05/29/2022 Document Reviewed: 05/29/2022 Elsevier Patient Education  2024 [employer].

## 2024-03-08 NOTE — Progress Notes (Signed)
 S/p s/p SB resection and repair marginal ulcer 3 months ago, Doing well, no fevers, taking po, ambulating , pending GI eval  PE NAD Abd: soft,prior midline scar, healing well no infection or issues  A/P Doing well Rtc prn F/u gi  [facility] [address] Maryruth

## 2024-03-10 ENCOUNTER — Telehealth: Payer: Self-pay

## 2024-03-10 NOTE — Telephone Encounter (Signed)
Faxed referral to Dr. Eather Colas at [phone].
